# Patient Record
Sex: Female | Born: 1960 | Race: White | Hispanic: No | Marital: Married | Smoking: Former smoker
Health system: Southern US, Community
[De-identification: ages and names within clinical notes are randomized; demographics above are authoritative.]

## PROBLEM LIST (undated history)

## (undated) DIAGNOSIS — J45909 Unspecified asthma, uncomplicated: Secondary | ICD-10-CM

## (undated) DIAGNOSIS — F329 Major depressive disorder, single episode, unspecified: Secondary | ICD-10-CM

## (undated) DIAGNOSIS — R32 Unspecified urinary incontinence: Secondary | ICD-10-CM

## (undated) DIAGNOSIS — Z201 Contact with and (suspected) exposure to tuberculosis: Secondary | ICD-10-CM

## (undated) DIAGNOSIS — K5792 Diverticulitis of intestine, part unspecified, without perforation or abscess without bleeding: Secondary | ICD-10-CM

## (undated) DIAGNOSIS — K219 Gastro-esophageal reflux disease without esophagitis: Secondary | ICD-10-CM

## (undated) DIAGNOSIS — R519 Headache, unspecified: Secondary | ICD-10-CM

## (undated) DIAGNOSIS — I10 Essential (primary) hypertension: Secondary | ICD-10-CM

## (undated) DIAGNOSIS — Z808 Family history of malignant neoplasm of other organs or systems: Secondary | ICD-10-CM

## (undated) DIAGNOSIS — G4730 Sleep apnea, unspecified: Secondary | ICD-10-CM

## (undated) DIAGNOSIS — E039 Hypothyroidism, unspecified: Secondary | ICD-10-CM

## (undated) DIAGNOSIS — Z803 Family history of malignant neoplasm of breast: Secondary | ICD-10-CM

## (undated) DIAGNOSIS — R51 Headache: Secondary | ICD-10-CM

## (undated) DIAGNOSIS — M1990 Unspecified osteoarthritis, unspecified site: Secondary | ICD-10-CM

## (undated) DIAGNOSIS — F32A Depression, unspecified: Secondary | ICD-10-CM

## (undated) DIAGNOSIS — F419 Anxiety disorder, unspecified: Secondary | ICD-10-CM

## (undated) DIAGNOSIS — T7840XA Allergy, unspecified, initial encounter: Secondary | ICD-10-CM

## (undated) DIAGNOSIS — E785 Hyperlipidemia, unspecified: Secondary | ICD-10-CM

## (undated) DIAGNOSIS — R002 Palpitations: Secondary | ICD-10-CM

## (undated) DIAGNOSIS — H269 Unspecified cataract: Secondary | ICD-10-CM

## (undated) DIAGNOSIS — Z800 Family history of malignant neoplasm of digestive organs: Secondary | ICD-10-CM

## (undated) DIAGNOSIS — T4145XA Adverse effect of unspecified anesthetic, initial encounter: Secondary | ICD-10-CM

## (undated) DIAGNOSIS — A048 Other specified bacterial intestinal infections: Secondary | ICD-10-CM

## (undated) DIAGNOSIS — G4733 Obstructive sleep apnea (adult) (pediatric): Secondary | ICD-10-CM

## (undated) DIAGNOSIS — C541 Malignant neoplasm of endometrium: Secondary | ICD-10-CM

## (undated) DIAGNOSIS — Z8719 Personal history of other diseases of the digestive system: Secondary | ICD-10-CM

## (undated) DIAGNOSIS — K589 Irritable bowel syndrome without diarrhea: Secondary | ICD-10-CM

## (undated) HISTORY — DX: Sleep apnea, unspecified: G47.30

## (undated) HISTORY — DX: Irritable bowel syndrome, unspecified: K58.9

## (undated) HISTORY — DX: Palpitations: R00.2

## (undated) HISTORY — PX: OTHER SURGICAL HISTORY: SHX169

## (undated) HISTORY — DX: Hypothyroidism, unspecified: E03.9

## (undated) HISTORY — DX: Major depressive disorder, single episode, unspecified: F32.9

## (undated) HISTORY — DX: Family history of malignant neoplasm of breast: Z80.3

## (undated) HISTORY — DX: Obstructive sleep apnea (adult) (pediatric): G47.33

## (undated) HISTORY — DX: Family history of malignant neoplasm of other organs or systems: Z80.8

## (undated) HISTORY — DX: Diverticulitis of intestine, part unspecified, without perforation or abscess without bleeding: K57.92

## (undated) HISTORY — DX: Hyperlipidemia, unspecified: E78.5

## (undated) HISTORY — DX: Family history of malignant neoplasm of digestive organs: Z80.0

## (undated) HISTORY — DX: Unspecified osteoarthritis, unspecified site: M19.90

## (undated) HISTORY — DX: Malignant neoplasm of endometrium: C54.1

## (undated) HISTORY — DX: Unspecified asthma, uncomplicated: J45.909

## (undated) HISTORY — DX: Unspecified cataract: H26.9

## (undated) HISTORY — DX: Essential (primary) hypertension: I10

## (undated) HISTORY — DX: Depression, unspecified: F32.A

## (undated) HISTORY — DX: Allergy, unspecified, initial encounter: T78.40XA

## (undated) HISTORY — DX: Anxiety disorder, unspecified: F41.9

---

## 1989-01-07 HISTORY — PX: TUBAL LIGATION: SHX77

## 1998-01-07 HISTORY — PX: CHOLECYSTECTOMY: SHX55

## 2014-01-07 HISTORY — PX: COLECTOMY: SHX59

## 2014-03-03 DIAGNOSIS — M5136 Other intervertebral disc degeneration, lumbar region: Secondary | ICD-10-CM | POA: Insufficient documentation

## 2014-03-03 DIAGNOSIS — M5030 Other cervical disc degeneration, unspecified cervical region: Secondary | ICD-10-CM

## 2014-03-03 HISTORY — DX: Other cervical disc degeneration, unspecified cervical region: M50.30

## 2014-04-01 LAB — HM COLONOSCOPY

## 2014-09-08 DIAGNOSIS — I2510 Atherosclerotic heart disease of native coronary artery without angina pectoris: Secondary | ICD-10-CM | POA: Insufficient documentation

## 2014-09-08 HISTORY — DX: Atherosclerotic heart disease of native coronary artery without angina pectoris: I25.10

## 2015-12-04 ENCOUNTER — Telehealth: Payer: Self-pay | Admitting: *Deleted

## 2015-12-04 NOTE — Telephone Encounter (Signed)
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  She will be very immunosuppressed.

## 2015-12-04 NOTE — Telephone Encounter (Signed)
New patient asking whether she needs to be seen at this time.  She was diagnosed with uterine cancer last week and has been referred to a cancer MD.  She is concerned about having latent TB treatment and cancer treatment at the same time.  The patient's PCP, Dr. Allean Found, referred her to [facility].  RN advised the patient that this RN would pass her question along to Dr Johnnye Sima to answer her question.  RN also advised the patient to call her PCP and GYN to let them know her concern.  Patient stated that she would call her MDs.

## 2015-12-05 NOTE — Telephone Encounter (Signed)
RN spoke with the patient and shared Dr. Algis Downs message.  Patient given office address and she will use GPS to come the appointment tomorrow.

## 2015-12-06 ENCOUNTER — Encounter: Payer: Self-pay | Admitting: Infectious Diseases

## 2015-12-06 ENCOUNTER — Ambulatory Visit (INDEPENDENT_AMBULATORY_CARE_PROVIDER_SITE_OTHER): Payer: Medicaid Other | Admitting: Infectious Diseases

## 2015-12-06 VITALS — BP 111/80 | HR 58 | Temp 97.6°F | Ht 63.0 in | Wt 222.5 lb

## 2015-12-06 DIAGNOSIS — C541 Malignant neoplasm of endometrium: Secondary | ICD-10-CM

## 2015-12-06 DIAGNOSIS — Z227 Latent tuberculosis: Secondary | ICD-10-CM

## 2015-12-06 DIAGNOSIS — R7611 Nonspecific reaction to tuberculin skin test without active tuberculosis: Secondary | ICD-10-CM

## 2015-12-06 HISTORY — DX: Latent tuberculosis: Z22.7

## 2015-12-06 LAB — CBC
HCT: 41.9 % (ref 35.0–45.0)
HEMOGLOBIN: 13.8 g/dL (ref 11.7–15.5)
MCH: 28.9 pg (ref 27.0–33.0)
MCHC: 32.9 g/dL (ref 32.0–36.0)
MCV: 87.7 fL (ref 80.0–100.0)
MPV: 9.4 fL (ref 7.5–12.5)
PLATELETS: 316 10*3/uL (ref 140–400)
RBC: 4.78 MIL/uL (ref 3.80–5.10)
RDW: 13.5 % (ref 11.0–15.0)
WBC: 10.2 10*3/uL (ref 3.8–10.8)

## 2015-12-06 LAB — COMPREHENSIVE METABOLIC PANEL
ALBUMIN: 3.7 g/dL (ref 3.6–5.1)
ALK PHOS: 132 U/L — AB (ref 33–130)
ALT: 10 U/L (ref 6–29)
AST: 13 U/L (ref 10–35)
BILIRUBIN TOTAL: 0.4 mg/dL (ref 0.2–1.2)
BUN: 15 mg/dL (ref 7–25)
CALCIUM: 9.2 mg/dL (ref 8.6–10.4)
CO2: 26 mmol/L (ref 20–31)
Chloride: 105 mmol/L (ref 98–110)
Creat: 0.87 mg/dL (ref 0.50–1.05)
Glucose, Bld: 100 mg/dL — ABNORMAL HIGH (ref 65–99)
POTASSIUM: 4.1 mmol/L (ref 3.5–5.3)
Sodium: 139 mmol/L (ref 135–146)
TOTAL PROTEIN: 6.7 g/dL (ref 6.1–8.1)

## 2015-12-06 MED ORDER — RIFAMPIN 300 MG PO CAPS: 600.0000 mg | ORAL_CAPSULE | Freq: Every day | ORAL | 3 refills | Status: AC

## 2015-12-06 NOTE — Assessment & Plan Note (Signed)
I have asked her to let me know, let her oncologist know, about her possible chemo regiemen so that we can monitor for drug interactions. She is scheduled to have hysterectomy.

## 2015-12-06 NOTE — Assessment & Plan Note (Addendum)
Will check her quantiferon and her LFTs today.  Will start her on rifampin 600mg  daily for 4 months.  Will see her back in 4 months Will ask that she calls if any problems- rash, icterus, pruritis, adverse drug reaction.   Spoke with pharm, rif should decrease her statin levels. Will leave statin dose as is.  Explained pt red secretions.  Has had her flu shot.

## 2015-12-06 NOTE — Progress Notes (Signed)
   Subjective:    Patient ID: Allison Spencer, female    DOB: 08/30/1960, 55 y.o.   MRN: [id number]  HPI 55 yo F with hx of joint and muscle aches. She was eval by rhuem.  She was screened there for TB and was found to have a positive quantiferon.  She was seen by health dept in [place] and had negative CXR, negative sputum Cx (per pt).   Feels well. Some days she has muscle aches, headaches, stomach discomfort.   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 Her PPDs at this time were negative. No known TB exposure.  Never been in [employer], never lived overseas. No homelessness, no incarceration.   Review of Systems  Constitutional: Negative for appetite change, chills, fever and unexpected weight change.  Respiratory: Positive for cough and shortness of breath.   Gastrointestinal: Positive for constipation and diarrhea.  Genitourinary: Negative for difficulty urinating.  Hematological: Negative for adenopathy.  cough with mucous production at night.  No night sweats.     Objective:   Physical Exam  Constitutional: She appears well-developed and well-nourished.  HENT:  Mouth/Throat: No oropharyngeal exudate.  Eyes: EOM are normal. Pupils are equal, round, and reactive to light.  Neck: Neck supple.  Cardiovascular: Normal rate, regular rhythm and normal heart sounds.   Pulmonary/Chest: Effort normal and breath sounds normal.  Abdominal: Soft. Bowel sounds are normal.  Musculoskeletal: She exhibits no edema.  Lymphadenopathy:    She has no cervical adenopathy.    She has no axillary adenopathy.          Assessment & Plan:

## 2015-12-08 LAB — QUANTIFERON TB GOLD ASSAY (BLOOD)
Interferon Gamma Release Assay: POSITIVE — AB
QUANTIFERON NIL VALUE: 0.04 [IU]/mL
Quantiferon Tb Ag Minus Nil Value: 0.42 IU/mL

## 2015-12-13 ENCOUNTER — Encounter: Payer: Self-pay | Admitting: Gynecologic Oncology

## 2015-12-13 ENCOUNTER — Ambulatory Visit: Payer: Medicaid Other | Attending: Gynecologic Oncology | Admitting: Gynecologic Oncology

## 2015-12-13 VITALS — BP 147/91 | HR 63 | Temp 97.8°F | Resp 18 | Ht 63.0 in | Wt 221.2 lb

## 2015-12-13 DIAGNOSIS — K589 Irritable bowel syndrome without diarrhea: Secondary | ICD-10-CM | POA: Diagnosis not present

## 2015-12-13 DIAGNOSIS — Z87891 Personal history of nicotine dependence: Secondary | ICD-10-CM | POA: Diagnosis not present

## 2015-12-13 DIAGNOSIS — F419 Anxiety disorder, unspecified: Secondary | ICD-10-CM | POA: Insufficient documentation

## 2015-12-13 DIAGNOSIS — Z9889 Other specified postprocedural states: Secondary | ICD-10-CM | POA: Insufficient documentation

## 2015-12-13 DIAGNOSIS — G4733 Obstructive sleep apnea (adult) (pediatric): Secondary | ICD-10-CM | POA: Insufficient documentation

## 2015-12-13 DIAGNOSIS — Z79899 Other long term (current) drug therapy: Secondary | ICD-10-CM | POA: Insufficient documentation

## 2015-12-13 DIAGNOSIS — I10 Essential (primary) hypertension: Secondary | ICD-10-CM | POA: Diagnosis not present

## 2015-12-13 DIAGNOSIS — Z888 Allergy status to other drugs, medicaments and biological substances status: Secondary | ICD-10-CM | POA: Diagnosis not present

## 2015-12-13 DIAGNOSIS — Z01818 Encounter for other preprocedural examination: Secondary | ICD-10-CM | POA: Insufficient documentation

## 2015-12-13 DIAGNOSIS — G4730 Sleep apnea, unspecified: Secondary | ICD-10-CM | POA: Diagnosis not present

## 2015-12-13 DIAGNOSIS — Z825 Family history of asthma and other chronic lower respiratory diseases: Secondary | ICD-10-CM | POA: Diagnosis not present

## 2015-12-13 DIAGNOSIS — E785 Hyperlipidemia, unspecified: Secondary | ICD-10-CM | POA: Insufficient documentation

## 2015-12-13 DIAGNOSIS — C541 Malignant neoplasm of endometrium: Secondary | ICD-10-CM | POA: Insufficient documentation

## 2015-12-13 DIAGNOSIS — Z8249 Family history of ischemic heart disease and other diseases of the circulatory system: Secondary | ICD-10-CM | POA: Diagnosis not present

## 2015-12-13 DIAGNOSIS — Z9049 Acquired absence of other specified parts of digestive tract: Secondary | ICD-10-CM | POA: Diagnosis not present

## 2015-12-13 DIAGNOSIS — F329 Major depressive disorder, single episode, unspecified: Secondary | ICD-10-CM | POA: Diagnosis not present

## 2015-12-13 NOTE — Patient Instructions (Signed)
Preparing for your Surgery  Plan for surgery on January 18, 2016 with Dr. Everitt Amber at [place] will be scheduled for robotic assisted total hysterectomy, bilateral salpingo-oophorectomy, sentinel lymph node biopsy.  Pre-operative Testing -You will receive a phone call from presurgical testing at [facility] to arrange for a pre-operative testing appointment before your surgery.  This appointment normally occurs one to two weeks before your scheduled surgery.   -Bring your insurance card, copy of an advanced directive if applicable, medication list  -At that visit, you will be asked to sign a consent for a possible blood transfusion in case a transfusion becomes necessary during surgery.  The need for a blood transfusion is rare but having consent is a necessary part of your care.     -You should not be taking blood thinners or aspirin at least ten days prior to surgery unless instructed by your surgeon.  Day Before Surgery at [place] will be asked to take in a light diet the day before surgery.  Avoid carbonated beverages.  You will be advised to have nothing to eat or drink after midnight the evening before.     Eat a light diet the day before surgery.  Examples including soups, broths, toast, yogurt, mashed potatoes.  Things to avoid include carbonated beverages (fizzy beverages), raw fruits and raw vegetables, or beans.    If your bowels are filled with gas, your surgeon will have difficulty visualizing your pelvic organs which increases your surgical risks.  Your role in recovery Your role is to become active as soon as directed by your doctor, while still giving yourself time to heal.  Rest when you feel tired. You will be asked to do the following in order to speed your recovery:  - Cough and breathe deeply. This helps toclear and expand your lungs and can prevent pneumonia. You may be given a spirometer to practice deep breathing. A staff  member will show you how to use the spirometer. - Do mild physical activity. Walking or moving your legs help your circulation and body functions return to normal. A staff member will help you when you try to walk and will provide you with simple exercises. Do not try to get up or walk alone the first time. - Actively manage your pain. Managing your pain lets you move in comfort. We will ask you to rate your pain on a scale of zero to 10. It is your responsibility to tell your doctor or nurse where and how much you hurt so your pain can be treated.  Special Considerations -If you are diabetic, you may be placed on insulin after surgery to have closer control over your blood sugars to promote healing and recovery.  This does not mean that you will be discharged on insulin.  If applicable, your oral antidiabetics will be resumed when you are tolerating a solid diet.  -Your final pathology results from surgery should be available by the Friday after surgery and the results will be relayed to you when available.   Blood Transfusion Information WHAT IS A BLOOD TRANSFUSION? A transfusion is the replacement of blood or some of its parts. Blood is made up of multiple cells which provide different functions.  Red blood cells carry oxygen and are used for blood loss replacement.  White blood cells fight against infection.  Platelets control bleeding.  Plasma helps clot blood.  Other blood products are available for specialized needs, such as hemophilia or  other clotting disorders. BEFORE THE TRANSFUSION  Who gives blood for transfusions?   You may be able to donate blood to be used at a later date on yourself (autologous donation).  Relatives can be asked to donate blood. This is generally not any safer than if you have received blood from a stranger. The same precautions are taken to ensure safety when a relative's blood is donated.  Healthy volunteers who are fully evaluated to make sure their  blood is safe. This is blood bank blood. Transfusion therapy is the safest it has ever been in the practice of medicine. Before blood is taken from a donor, a complete history is taken to make sure that person has no history of diseases nor engages in risky social behavior (examples are intravenous drug use or sexual activity with multiple partners). The donor's travel history is screened to minimize risk of transmitting infections, such as malaria. The donated blood is tested for signs of infectious diseases, such as HIV and hepatitis. The blood is then tested to be sure it is compatible with you in order to minimize the chance of a transfusion reaction. If you or a relative donates blood, this is often done in anticipation of surgery and is not appropriate for emergency situations. It takes many days to process the donated blood. RISKS AND COMPLICATIONS Although transfusion therapy is very safe and saves many lives, the main dangers of transfusion include:   Getting an infectious disease.  Developing a transfusion reaction. This is an allergic reaction to something in the blood you were given. Every precaution is taken to prevent this. The decision to have a blood transfusion has been considered carefully by your caregiver before blood is given. Blood is not given unless the benefits outweigh the risks.

## 2015-12-13 NOTE — Progress Notes (Signed)
Consult Note: Gyn-Onc  Consult was requested by Dr. Alfonse Spruce for the evaluation of Allison Spencer 55 y.o. female  CC:  Chief Complaint  Patient presents with  . endometrial adenocarcinoma    Assessment/Plan:  Allison Spencer  is a 55 y.o.  year old with grade 1 endometrial cancer.  A detailed discussion was held with the patient and her family with regard to to her endometrial cancer diagnosis. We discussed the standard management options for uterine cancer which includes surgery followed possibly by adjuvant therapy depending on the results of surgery. The options for surgical management include a hysterectomy and removal of the tubes and ovaries possibly with removal of pelvic and para-aortic lymph nodes.If feasible, a minimally invasive approach including a robotic hysterectomy or laparoscopic hysterectomy have benefits including shorter hospital stay, recovery time and better wound healing than with open surgery. The patient has been counseled about these surgical options and the risks of surgery in general including infection, bleeding, damage to surrounding structures (including bowel, bladder, ureters, nerves or vessels), and the postoperative risks of PE/ DVT, and lymphedema. I extensively reviewed the additional risks of robotic hysterectomy including possible need for conversion to open laparotomy.  I discussed positioning during surgery of trendelenberg and risks of minor facial swelling and care we take in preoperative positioning.  After counseling and consideration of her options, she desires to proceed with robotic assisted total hysterectomy with bilateral sapingo-oophorectomy and SLN biopsy.   She will be seen by anesthesia for preoperative clearance and discussion of postoperative pain management.  She was given the opportunity to ask questions, which were answered to her satisfaction, and she is agreement with the above mentioned plan of care.  I discussed that she is at  increased risk for surgical complications due to her morbid obesity. I discussed that she may require mini-laparotomy for specimen delivery.  She will bring her CPAP machine for postop use.  She has issues with severe anxiety while coming out of anesthesia.   HPI: Allison Spencer is a 55 year old G3P3 who is seen in consultation at the request of Dr Alfonse Spruce for grade 1 endometrial cancer. She was for a routine pap smear by her PCP, Heide Scales, FNP on 07/05/15. This revealed atypical glandular cells - endometrial. She was then seen by Dr Alfonse Spruce on 11/24/15 and reported a single episode of light pink spotting but otherwise no bleeding. 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  The patient has had a recent history of a laparoscopic sigmoid colectomy for diverticulitis in 2016. She also has a history of a laparoscopic cholecystectomy and tubal ligation.  She has morbid obesity and severe sleep apnea.   Current Meds:  Outpatient Encounter Prescriptions as of 12/13/2015  Medication Sig  . B Complex Vitamins (VITAMIN-B COMPLEX) TABS Take by mouth.  . dicyclomine (BENTYL) 10 MG capsule Take 10 mg by mouth.  . escitalopram (LEXAPRO) 20 MG tablet TAKE ONE AND ONE HALF (1.5) TABLETS BY MOUTH EVERY MORNING  . ibuprofen (ADVIL,MOTRIN) 800 MG tablet TAKE 1 TABLET BY MOUTH 3 TIMES A DAY WITH FOOD OR MILK  . ketoconazole (NIZORAL) 2 % shampoo APPLY TO WASH TWICE WEEKLY  . losartan (COZAAR) 25 MG tablet Take 25 mg by mouth daily.  . metoprolol tartrate (LOPRESSOR) 25 MG tablet TAKE 1 TABLET BY MOUTH TWICE A DAY  . pantoprazole (PROTONIX) 40 MG tablet TAKE 1 TABLET BY MOUTH DAILY  .  pantoprazole (PROTONIX) 40 MG tablet Take 40 mg by mouth daily.  . pravastatin (PRAVACHOL) 20 MG tablet TAKE 1 TABLET BY MOUTH DAILY  . ranitidine (ZANTAC) 300 MG capsule Take 300 mg by mouth every evening.  . rifampin (RIFADIN) 300 MG  capsule Take 2 capsules (600 mg total) by mouth daily.  . traZODone (DESYREL) 100 MG tablet TAKE TWO AND ONE HALF (2.5) TABLETS BY MOUTH AT BEDTIME, AS NEEDED   No facility-administered encounter medications on file as of 12/13/2015.     Allergy:  Allergies  Allergen Reactions  . Codeine Nausea And Vomiting    UNKNOWN  . Sulfa Antibiotics     NAUSEA    Social Hx:   Social History   Social History  . Marital status: Single    Spouse name: N/A  . Number of children: N/A  . Years of education: N/A   Occupational History  . Not on file.   Social History Main Topics  . Smoking status: Former Smoker    Types: Cigarettes    Quit date: 12/05/1988  . Smokeless tobacco: Never Used  . Alcohol use Not on file  . Drug use: Unknown  . Sexual activity: Yes    Partners: Male   Other Topics Concern  . Not on file   Social History Narrative  . No narrative on file    Past Surgical Hx:  Past Surgical History:  Procedure Laterality Date  . CHOLECYSTECTOMY  2000  . COLECTOMY  2016  . TUBAL LIGATION  1991    Past Medical Hx:  Past Medical History:  Diagnosis Date  . Arthritis   . Depression   . Diverticulitis   . Endometrial cancer ([place])   . Essential hypertension   . Heart palpitations   . Hyperlipidemia   . Hyperlipidemia   . IBS (irritable bowel syndrome)   . OSA (obstructive sleep apnea)     Past Gynecological History:  SVD x 3 No LMP recorded. Patient is postmenopausal.  Family Hx:  Family History  Problem Relation Age of Onset  . COPD Mother   . CAD Father     Review of Systems:  Constitutional  Feels well,    ENT Normal appearing ears and nares bilaterally Skin/Breast  No rash, sores, jaundice, itching, dryness Cardiovascular  No chest pain, shortness of breath, or edema  Pulmonary  No cough or wheeze.  Gastro Intestinal  No nausea, vomitting, or diarrhoea. No bright red blood per rectum, no abdominal pain, change in bowel movement, or  constipation.  Genito Urinary  No frequency, urgency, dysuria, + vaginal spotting. Musculo Skeletal  No myalgia, arthralgia, joint swelling or pain  Neurologic  No weakness, numbness, change in gait,  Psychology  No depression, anxiety, insomnia.   Vitals:  Blood pressure (!) 147/91, pulse 63, temperature 97.8 F (36.6 C), temperature source Oral, resp. rate 18, height 5\' 3"  (1.6 m), weight 221 lb 3.2 oz (100.3 kg), SpO2 97 %.  Physical Exam: WD in NAD Neck  Supple NROM, without any enlargements.  Lymph Node Survey No cervical supraclavicular or inguinal adenopathy Cardiovascular  Pulse normal rate, regularity and rhythm. S1 and S2 normal.  Lungs  Clear to auscultation bilateraly, without wheezes/crackles/rhonchi. Good air movement.  Skin  No rash/lesions/breakdown  Psychiatry  Alert and oriented to person, place, and time  Abdomen  Normoactive bowel sounds, abdomen soft, non-tender and obese without evidence of hernia.  Back No CVA tenderness Genito Urinary  Vulva/vagina: Normal external female genitalia.  No  lesions. No discharge or bleeding.  Bladder/urethra:  No lesions or masses, well supported bladder  Vagina: normal  Cervix: Normal appearing, no lesions.  Uterus:  Slightly bulky, mobile, no parametrial involvement or nodularity.  Adnexa: no palpable masses. Rectal  deferred.  Extremities  No bilateral cyanosis, clubbing or edema.   Donaciano Eva, MD  12/13/2015, 10:33 AM

## 2016-01-10 DIAGNOSIS — T8859XA Other complications of anesthesia, initial encounter: Secondary | ICD-10-CM

## 2016-01-10 HISTORY — PX: UPPER GI ENDOSCOPY: SHX6162

## 2016-01-10 HISTORY — DX: Other complications of anesthesia, initial encounter: T88.59XA

## 2016-01-11 NOTE — Patient Instructions (Addendum)
Allison Spencer  01/11/2016   Your procedure is scheduled on: 01-18-16  Report to [facility] Main  Entrance take [facility]  elevators to 3rd floor to  [place] at 530  AM.  Call this number if you have problems the morning of surgery [phone]  BRING CPAP MASK AND TUBING   Remember: ONLY 1 PERSON MAY GO WITH YOU TO SHORT STAY TO GET  READY MORNING OF YOUR SURGERY.  Do not eat food or drink liquids :After Midnight. LIGHT DIET DAY BEFORE SURGERY, SEE INSTRUCTIONS BELOW.NOTHING BY MOUTH AFTER MIDNIGHT NIGHT BEFORE SURGERY  Take these medicines the morning of surgery with A SIP OF WATER: ESCITALOPRAM (LEXAPRO), METORPOLOL TARTRATE, PANTAPRAZOLE (PROTONIX )  PRAVASTATIN (PRAVACHOL), RANITIDNE (ZANTAC), RIFAMPIN (RIFADIN)                               You may not have any metal on your body including hair pins and              piercings  Do not wear jewelry, make-up, lotions, powders or perfumes, deodorant             Do not wear nail polish.  Do not shave  48 hours prior to surgery.              Men may shave face and neck.   Do not bring valuables to the hospital. [place].  Contacts, dentures or bridgework may not be worn into surgery.  Leave suitcase in the car. After surgery it may be brought to your room.     _____________________________________________________________________             Eat a light diet the day before surgery.  Examples including soups, broths, toast, yogurt, mashed potatoes.  Things to avoid include carbonated beverages (fizzy beverages), raw fruits and raw vegetables, or beans.   If your bowels are filled with gas, your surgeon will have difficulty visualizing your pelvic organs which increases your surgical risks  .[place] - Preparing for Surgery Before surgery, you can play an important role.  Because skin is not sterile, your skin needs to be as free of germs as possible.  You  can reduce the number of germs on your skin by washing with CHG (chlorahexidine gluconate) soap before surgery.  CHG is an antiseptic cleaner which kills germs and bonds with the skin to continue killing germs even after washing. Please DO NOT use if you have an allergy to CHG or antibacterial soaps.  If your skin becomes reddened/irritated stop using the CHG and inform your nurse when you arrive at Short Stay. Do not shave (including legs and underarms) for at least 48 hours prior to the first CHG shower.  You may shave your face/neck. Please follow these instructions carefully:  1.  Shower with CHG Soap the night before surgery and the  morning of Surgery.  2.  If you choose to wash your hair, wash your hair first as usual with your  normal  shampoo.  3.  After you shampoo, rinse your hair and body thoroughly to remove the  shampoo.  4.  Use CHG as you would any other liquid soap.  You can apply chg directly  to the skin and wash                       Gently with a scrungie or clean washcloth.  5.  Apply the CHG Soap to your body ONLY FROM THE NECK DOWN.   Do not use on face/ open                           Wound or open sores. Avoid contact with eyes, ears mouth and genitals (private parts).                       Wash face,  Genitals (private parts) with your normal soap.             6.  Wash thoroughly, paying special attention to the area where your surgery  will be performed.  7.  Thoroughly rinse your body with warm water from the neck down.  8.  DO NOT shower/wash with your normal soap after using and rinsing off  the CHG Soap.                9.  Pat yourself dry with a clean towel.            10.  Wear clean pajamas.            11.  Place clean sheets on your bed the night of your first shower and do not  sleep with pets. Day of Surgery : Do not apply any lotions/deodorants the morning of surgery.  Please wear clean clothes to the hospital/surgery center.  FAILURE  TO FOLLOW THESE INSTRUCTIONS MAY RESULT IN THE CANCELLATION OF YOUR SURGERY PATIENT SIGNATURE_________________________________  NURSE SIGNATURE__________________________________  ________________________________________________________________________   Adam Phenix  An incentive spirometer is a tool that can help keep your lungs clear and active. This tool measures how well you are filling your lungs with each breath. Taking long deep breaths may help reverse or decrease the chance of developing breathing (pulmonary) problems (especially infection) following:  A long period of time when you are unable to move or be active. BEFORE THE PROCEDURE   If the spirometer includes an indicator to show your best effort, your nurse or respiratory therapist will set it to a desired goal.  If possible, sit up straight or lean slightly forward. Try not to slouch.  Hold the incentive spirometer in an upright position. INSTRUCTIONS FOR USE  1. Sit on the edge of your bed if possible, or sit up as far as you can in bed or on a chair. 2. Hold the incentive spirometer in an upright position. 3. Breathe out normally. 4. Place the mouthpiece in your mouth and seal your lips tightly around it. 5. Breathe in slowly and as deeply as possible, raising the piston or the ball toward the top of the column. 6. Hold your breath for 3-5 seconds or for as long as possible. Allow the piston or ball to fall to the bottom of the column. 7. Remove the mouthpiece from your mouth and breathe out normally. 8. Rest for a few seconds and repeat Steps 1 through 7 at least 10 times every 1-2 hours when you are awake. Take your time and take a few normal breaths between deep breaths. 9. The spirometer may include an indicator to show  your best effort. Use the indicator as a goal to work toward during each repetition. 10. After each set of 10 deep breaths, practice coughing to be sure your lungs are clear. If you have an  incision (the cut made at the time of surgery), support your incision when coughing by placing a pillow or rolled up towels firmly against it. Once you are able to get out of bed, walk around indoors and cough well. You may stop using the incentive spirometer when instructed by your caregiver.  RISKS AND COMPLICATIONS  Take your time so you do not get dizzy or light-headed.  If you are in pain, you may need to take or ask for pain medication before doing incentive spirometry. It is harder to take a deep breath if you are having pain. AFTER USE  Rest and breathe slowly and easily.  It can be helpful to keep track of a log of your progress. Your caregiver can provide you with a simple table to help with this. If you are using the spirometer at home, follow these instructions: [place] IF:   You are having difficultly using the spirometer.  You have trouble using the spirometer as often as instructed.  Your pain medication is not giving enough relief while using the spirometer.  You develop fever of 100.5 F (38.1 C) or higher. SEEK IMMEDIATE MEDICAL CARE IF:   You cough up bloody sputum that had not been present before.  You develop fever of 102 F (38.9 C) or greater.  You develop worsening pain at or near the incision site. MAKE SURE YOU:   Understand these instructions.  Will watch your condition.  Will get help right away if you are not doing well or get worse. Document Released: 05/06/2006 Document Revised: 03/18/2011 Document Reviewed: 07/07/2006 ExitCare Patient Information 2014 Exit[employer], [place].   ________________________________________________________________________  WHAT IS A BLOOD TRANSFUSION? Blood Transfusion Information  A transfusion is the replacement of blood or some of its parts. Blood is made up of multiple cells which provide different functions.  Red blood cells carry oxygen and are used for blood loss replacement.  White blood cells  fight against infection.  Platelets control bleeding.  Plasma helps clot blood.  Other blood products are available for specialized needs, such as hemophilia or other clotting disorders. BEFORE THE TRANSFUSION  Who gives blood for transfusions?   Healthy volunteers who are fully evaluated to make sure their blood is safe. This is blood bank blood. Transfusion therapy is the safest it has ever been in the practice of medicine. Before blood is taken from a donor, a complete history is taken to make sure that person has no history of diseases nor engages in risky social behavior (examples are intravenous drug use or sexual activity with multiple partners). The donor's travel history is screened to minimize risk of transmitting infections, such as malaria. The donated blood is tested for signs of infectious diseases, such as HIV and hepatitis. The blood is then tested to be sure it is compatible with you in order to minimize the chance of a transfusion reaction. If you or a relative donates blood, this is often done in anticipation of surgery and is not appropriate for emergency situations. It takes many days to process the donated blood. RISKS AND COMPLICATIONS Although transfusion therapy is very safe and saves many lives, the main dangers of transfusion include:   Getting an infectious disease.  Developing a transfusion reaction. This is an allergic reaction to  something in the blood you were given. Every precaution is taken to prevent this. The decision to have a blood transfusion has been considered carefully by your caregiver before blood is given. Blood is not given unless the benefits outweigh the risks. AFTER THE TRANSFUSION  Right after receiving a blood transfusion, you will usually feel much better and more energetic. This is especially true if your red blood cells have gotten low (anemic). The transfusion raises the level of the red blood cells which carry oxygen, and this usually  causes an energy increase.  The nurse administering the transfusion will monitor you carefully for complications. HOME CARE INSTRUCTIONS  No special instructions are needed after a transfusion. You may find your energy is better. Speak with your caregiver about any limitations on activity for underlying diseases you may have. SEEK MEDICAL CARE IF:   Your condition is not improving after your transfusion.  You develop redness or irritation at the intravenous (IV) site. SEEK IMMEDIATE MEDICAL CARE IF:  Any of the following symptoms occur over the next 12 hours:  Shaking chills.  You have a temperature by mouth above 102 F (38.9 C), not controlled by medicine.  Chest, back, or muscle pain.  People around you feel you are not acting correctly or are confused.  Shortness of breath or difficulty breathing.  Dizziness and fainting.  You get a rash or develop hives.  You have a decrease in urine output.  Your urine turns a dark color or changes to pink, red, or brown. Any of the following symptoms occur over the next 10 days:  You have a temperature by mouth above 102 F (38.9 C), not controlled by medicine.  Shortness of breath.  Weakness after normal activity.  The white part of the eye turns yellow (jaundice).  You have a decrease in the amount of urine or are urinating less often.  Your urine turns a dark color or changes to pink, red, or brown. Document Released: 12/22/1999 Document Revised: 03/18/2011 Document Reviewed: 08/10/2007 [facility] Patient Information 2014 [place], [place].  _______________________________________________________________________

## 2016-01-12 NOTE — Progress Notes (Signed)
Spoke with Berniece Andreas RN, ok to use July 2017 CXR, does not need repeating for surgery on 01/18/16 EKG 07/04/2015 per [facility]

## 2016-01-15 ENCOUNTER — Encounter (HOSPITAL_COMMUNITY): Payer: Self-pay

## 2016-01-15 ENCOUNTER — Encounter (HOSPITAL_COMMUNITY)
Admission: RE | Admit: 2016-01-15 | Discharge: 2016-01-15 | Disposition: A | Discharge status: Home / Self Care | Payer: Medicaid Other | Source: Ambulatory Visit | Attending: Gynecologic Oncology | Admitting: Gynecologic Oncology

## 2016-01-15 DIAGNOSIS — C541 Malignant neoplasm of endometrium: Secondary | ICD-10-CM | POA: Insufficient documentation

## 2016-01-15 DIAGNOSIS — Z01812 Encounter for preprocedural laboratory examination: Secondary | ICD-10-CM | POA: Diagnosis present

## 2016-01-15 HISTORY — DX: Adverse effect of unspecified anesthetic, initial encounter: T41.45XA

## 2016-01-15 HISTORY — DX: Headache: R51

## 2016-01-15 HISTORY — DX: Personal history of other diseases of the digestive system: Z87.19

## 2016-01-15 HISTORY — DX: Other specified bacterial intestinal infections: A04.8

## 2016-01-15 HISTORY — DX: Gastro-esophageal reflux disease without esophagitis: K21.9

## 2016-01-15 HISTORY — DX: Headache, unspecified: R51.9

## 2016-01-15 HISTORY — DX: Unspecified urinary incontinence: R32

## 2016-01-15 HISTORY — DX: Contact with and (suspected) exposure to tuberculosis: Z20.1

## 2016-01-15 LAB — URINALYSIS, ROUTINE W REFLEX MICROSCOPIC
Bacteria, UA: NONE SEEN
Bilirubin Urine: NEGATIVE
GLUCOSE, UA: NEGATIVE mg/dL
Ketones, ur: NEGATIVE mg/dL
Leukocytes, UA: NEGATIVE
NITRITE: NEGATIVE
PH: 5 (ref 5.0–8.0)
PROTEIN: NEGATIVE mg/dL
Specific Gravity, Urine: 1.013 (ref 1.005–1.030)

## 2016-01-15 LAB — CBC WITH DIFFERENTIAL/PLATELET
BASOS ABS: 0.1 10*3/uL (ref 0.0–0.1)
BASOS PCT: 1 %
EOS ABS: 0.4 10*3/uL (ref 0.0–0.7)
EOS PCT: 3 %
HCT: 40.4 % (ref 36.0–46.0)
Hemoglobin: 13.4 g/dL (ref 12.0–15.0)
Lymphocytes Relative: 28 %
Lymphs Abs: 3.4 10*3/uL (ref 0.7–4.0)
MCH: 29.5 pg (ref 26.0–34.0)
MCHC: 33.2 g/dL (ref 30.0–36.0)
MCV: 88.8 fL (ref 78.0–100.0)
Monocytes Absolute: 0.9 10*3/uL (ref 0.1–1.0)
Monocytes Relative: 8 %
Neutro Abs: 7.5 10*3/uL (ref 1.7–7.7)
Neutrophils Relative %: 60 %
PLATELETS: 299 10*3/uL (ref 150–400)
RBC: 4.55 MIL/uL (ref 3.87–5.11)
RDW: 13.6 % (ref 11.5–15.5)
WBC: 12.3 10*3/uL — AB (ref 4.0–10.5)

## 2016-01-15 LAB — COMPREHENSIVE METABOLIC PANEL
ALT: 16 U/L (ref 14–54)
AST: 16 U/L (ref 15–41)
Albumin: 3.6 g/dL (ref 3.5–5.0)
Alkaline Phosphatase: 122 U/L (ref 38–126)
Anion gap: 6 (ref 5–15)
BUN: 11 mg/dL (ref 6–20)
CHLORIDE: 106 mmol/L (ref 101–111)
CO2: 27 mmol/L (ref 22–32)
CREATININE: 0.81 mg/dL (ref 0.44–1.00)
Calcium: 8.9 mg/dL (ref 8.9–10.3)
GFR calc non Af Amer: >60 mL/min (ref >60)
Glucose, Bld: 100 mg/dL — ABNORMAL HIGH (ref 65–99)
POTASSIUM: 3.9 mmol/L (ref 3.5–5.1)
SODIUM: 139 mmol/L (ref 135–145)
Total Bilirubin: 0.3 mg/dL (ref 0.3–1.2)
Total Protein: 6.8 g/dL (ref 6.5–8.1)

## 2016-01-15 LAB — ABO/RH: ABO/RH(D): O POS

## 2016-01-15 NOTE — Progress Notes (Signed)
LOV DR Geraldo Pitter 11/02/2015 in chart CXR 01/05/2016 in chart

## 2016-01-16 ENCOUNTER — Encounter (HOSPITAL_COMMUNITY): Payer: Self-pay

## 2016-01-18 ENCOUNTER — Ambulatory Visit (HOSPITAL_COMMUNITY): Payer: Medicaid Other | Admitting: Anesthesiology

## 2016-01-18 ENCOUNTER — Encounter (HOSPITAL_COMMUNITY): Payer: Self-pay | Admitting: *Deleted

## 2016-01-18 ENCOUNTER — Ambulatory Visit (HOSPITAL_COMMUNITY)
Admission: RE | Admit: 2016-01-18 | Discharge: 2016-01-19 | Disposition: A | Discharge status: Home / Self Care | Payer: Medicaid Other | Source: Ambulatory Visit | Attending: Gynecologic Oncology | Admitting: Gynecologic Oncology

## 2016-01-18 ENCOUNTER — Encounter (HOSPITAL_COMMUNITY)
Admission: RE | Disposition: A | Discharge status: Home / Self Care | Payer: Self-pay | Source: Ambulatory Visit | Attending: Gynecologic Oncology

## 2016-01-18 DIAGNOSIS — I10 Essential (primary) hypertension: Secondary | ICD-10-CM | POA: Diagnosis not present

## 2016-01-18 DIAGNOSIS — Z882 Allergy status to sulfonamides status: Secondary | ICD-10-CM | POA: Diagnosis not present

## 2016-01-18 DIAGNOSIS — F329 Major depressive disorder, single episode, unspecified: Secondary | ICD-10-CM | POA: Insufficient documentation

## 2016-01-18 DIAGNOSIS — Z6838 Body mass index (BMI) 38.0-38.9, adult: Secondary | ICD-10-CM | POA: Insufficient documentation

## 2016-01-18 DIAGNOSIS — Z87891 Personal history of nicotine dependence: Secondary | ICD-10-CM | POA: Diagnosis not present

## 2016-01-18 DIAGNOSIS — Z885 Allergy status to narcotic agent status: Secondary | ICD-10-CM | POA: Diagnosis not present

## 2016-01-18 DIAGNOSIS — C775 Secondary and unspecified malignant neoplasm of intrapelvic lymph nodes: Secondary | ICD-10-CM | POA: Diagnosis not present

## 2016-01-18 DIAGNOSIS — M1990 Unspecified osteoarthritis, unspecified site: Secondary | ICD-10-CM | POA: Insufficient documentation

## 2016-01-18 DIAGNOSIS — G4733 Obstructive sleep apnea (adult) (pediatric): Secondary | ICD-10-CM | POA: Diagnosis not present

## 2016-01-18 DIAGNOSIS — E785 Hyperlipidemia, unspecified: Secondary | ICD-10-CM | POA: Insufficient documentation

## 2016-01-18 DIAGNOSIS — N83292 Other ovarian cyst, left side: Secondary | ICD-10-CM | POA: Diagnosis not present

## 2016-01-18 DIAGNOSIS — N83291 Other ovarian cyst, right side: Secondary | ICD-10-CM | POA: Insufficient documentation

## 2016-01-18 DIAGNOSIS — C541 Malignant neoplasm of endometrium: Secondary | ICD-10-CM | POA: Diagnosis present

## 2016-01-18 DIAGNOSIS — K589 Irritable bowel syndrome without diarrhea: Secondary | ICD-10-CM | POA: Diagnosis not present

## 2016-01-18 DIAGNOSIS — F419 Anxiety disorder, unspecified: Secondary | ICD-10-CM | POA: Diagnosis not present

## 2016-01-18 HISTORY — PX: ROBOTIC ASSISTED TOTAL HYSTERECTOMY WITH BILATERAL SALPINGO OOPHERECTOMY: SHX6086

## 2016-01-18 LAB — TYPE AND SCREEN
ABO/RH(D): O POS
ANTIBODY SCREEN: NEGATIVE

## 2016-01-18 SURGERY — HYSTERECTOMY, TOTAL, ROBOT-ASSISTED, LAPAROSCOPIC, WITH BILATERAL SALPINGO-OOPHORECTOMY
Anesthesia: General | Laterality: Bilateral

## 2016-01-18 MED ORDER — KCL IN DEXTROSE-NACL 20-5-0.45 MEQ/L-%-% IV SOLN
INTRAVENOUS | Status: DC
  Administered 2016-01-18 (×2): via INTRAVENOUS
  Filled 2016-01-18 (×2): qty 1000

## 2016-01-18 MED ORDER — STERILE WATER FOR INJECTION IJ SOLN
INTRAMUSCULAR | Status: AC
  Filled 2016-01-18: qty 10

## 2016-01-18 MED ORDER — CEFAZOLIN SODIUM-DEXTROSE 2-4 GM/100ML-% IV SOLN
INTRAVENOUS | Status: AC
  Filled 2016-01-18: qty 100

## 2016-01-18 MED ORDER — GABAPENTIN 300 MG PO CAPS
600.0000 mg | ORAL_CAPSULE | Freq: Every day | ORAL | Status: AC
  Administered 2016-01-18: 600 mg via ORAL
  Filled 2016-01-18: qty 2

## 2016-01-18 MED ORDER — ONDANSETRON HCL 4 MG/2ML IJ SOLN
4.0000 mg | Freq: Four times a day (QID) | INTRAMUSCULAR | Status: DC | PRN
  Administered 2016-01-18 – 2016-01-19 (×2): 4 mg via INTRAVENOUS
  Filled 2016-01-18 (×2): qty 2

## 2016-01-18 MED ORDER — HYDROMORPHONE HCL 1 MG/ML IJ SOLN
0.2500 mg | INTRAMUSCULAR | Status: DC | PRN
  Administered 2016-01-18: 0.5 mg via INTRAVENOUS

## 2016-01-18 MED ORDER — ESCITALOPRAM OXALATE 20 MG PO TABS
20.0000 mg | ORAL_TABLET | Freq: Every day | ORAL | Status: DC
  Administered 2016-01-19: 20 mg via ORAL
  Filled 2016-01-18: qty 1

## 2016-01-18 MED ORDER — CEFAZOLIN SODIUM-DEXTROSE 2-4 GM/100ML-% IV SOLN
2.0000 g | INTRAVENOUS | Status: AC
  Administered 2016-01-18: 2 g via INTRAVENOUS

## 2016-01-18 MED ORDER — RIFAMPIN 300 MG PO CAPS
600.0000 mg | ORAL_CAPSULE | Freq: Every day | ORAL | Status: DC
  Administered 2016-01-19: 600 mg via ORAL
  Filled 2016-01-18: qty 2

## 2016-01-18 MED ORDER — FENTANYL CITRATE (PF) 250 MCG/5ML IJ SOLN
INTRAMUSCULAR | Status: AC
  Filled 2016-01-18: qty 5

## 2016-01-18 MED ORDER — PHENYLEPHRINE 40 MCG/ML (10ML) SYRINGE FOR IV PUSH (FOR BLOOD PRESSURE SUPPORT)
PREFILLED_SYRINGE | INTRAVENOUS | Status: DC | PRN
  Administered 2016-01-18: 80 ug via INTRAVENOUS

## 2016-01-18 MED ORDER — PROPOFOL 10 MG/ML IV BOLUS
INTRAVENOUS | Status: DC | PRN
  Administered 2016-01-18: 200 mg via INTRAVENOUS

## 2016-01-18 MED ORDER — MIDAZOLAM HCL 2 MG/2ML IJ SOLN
INTRAMUSCULAR | Status: AC
  Filled 2016-01-18: qty 2

## 2016-01-18 MED ORDER — ONDANSETRON HCL 4 MG/2ML IJ SOLN
INTRAMUSCULAR | Status: DC | PRN
  Administered 2016-01-18: 4 mg via INTRAVENOUS

## 2016-01-18 MED ORDER — FENTANYL CITRATE (PF) 100 MCG/2ML IJ SOLN
INTRAMUSCULAR | Status: AC
  Filled 2016-01-18: qty 2

## 2016-01-18 MED ORDER — ROCURONIUM BROMIDE 10 MG/ML (PF) SYRINGE
PREFILLED_SYRINGE | INTRAVENOUS | Status: DC | PRN
  Administered 2016-01-18: 10 mg via INTRAVENOUS
  Administered 2016-01-18: 20 mg via INTRAVENOUS
  Administered 2016-01-18: 50 mg via INTRAVENOUS

## 2016-01-18 MED ORDER — HYDROMORPHONE HCL 2 MG PO TABS: 2.0000 mg | ORAL_TABLET | ORAL | Status: DC | PRN

## 2016-01-18 MED ORDER — ONDANSETRON HCL 4 MG PO TABS: 4.0000 mg | ORAL_TABLET | Freq: Four times a day (QID) | ORAL | Status: DC | PRN

## 2016-01-18 MED ORDER — HYDROMORPHONE HCL 2 MG/ML IJ SOLN
0.2000 mg | INTRAMUSCULAR | Status: DC | PRN
  Administered 2016-01-18: 0.6 mg via INTRAVENOUS
  Filled 2016-01-18: qty 1

## 2016-01-18 MED ORDER — TRAMADOL HCL 50 MG PO TABS
100.0000 mg | ORAL_TABLET | Freq: Two times a day (BID) | ORAL | Status: DC | PRN
  Administered 2016-01-18: 100 mg via ORAL
  Filled 2016-01-18 (×2): qty 2

## 2016-01-18 MED ORDER — PANTOPRAZOLE SODIUM 40 MG PO TBEC
80.0000 mg | DELAYED_RELEASE_TABLET | Freq: Every day | ORAL | Status: DC
  Administered 2016-01-19: 80 mg via ORAL
  Filled 2016-01-18: qty 2

## 2016-01-18 MED ORDER — LACTATED RINGERS IV SOLN
INTRAVENOUS | Status: DC | PRN
  Administered 2016-01-18: 1000 mL

## 2016-01-18 MED ORDER — ACETAMINOPHEN 500 MG PO TABS
1000.0000 mg | ORAL_TABLET | Freq: Two times a day (BID) | ORAL | Status: DC
  Administered 2016-01-18 – 2016-01-19 (×2): 1000 mg via ORAL
  Filled 2016-01-18 (×2): qty 2

## 2016-01-18 MED ORDER — LACTATED RINGERS IV SOLN
INTRAVENOUS | Status: DC | PRN
  Administered 2016-01-18 (×2): via INTRAVENOUS

## 2016-01-18 MED ORDER — IBUPROFEN 800 MG PO TABS
800.0000 mg | ORAL_TABLET | Freq: Three times a day (TID) | ORAL | Status: DC | PRN
  Administered 2016-01-19: 800 mg via ORAL
  Filled 2016-01-18: qty 1

## 2016-01-18 MED ORDER — ENOXAPARIN SODIUM 40 MG/0.4ML ~~LOC~~ SOLN
40.0000 mg | SUBCUTANEOUS | Status: AC
  Administered 2016-01-18: 40 mg via SUBCUTANEOUS
  Filled 2016-01-18: qty 0.4

## 2016-01-18 MED ORDER — ONDANSETRON HCL 4 MG/2ML IJ SOLN: 4.0000 mg | Freq: Once | INTRAMUSCULAR | Status: DC | PRN

## 2016-01-18 MED ORDER — MIDAZOLAM HCL 5 MG/5ML IJ SOLN
INTRAMUSCULAR | Status: DC | PRN
  Administered 2016-01-18: 2 mg via INTRAVENOUS

## 2016-01-18 MED ORDER — PRAVASTATIN SODIUM 20 MG PO TABS
20.0000 mg | ORAL_TABLET | Freq: Every day | ORAL | Status: DC
  Administered 2016-01-19: 20 mg via ORAL
  Filled 2016-01-18: qty 1

## 2016-01-18 MED ORDER — SUGAMMADEX SODIUM 200 MG/2ML IV SOLN
INTRAVENOUS | Status: DC | PRN
  Administered 2016-01-18: 200 mg via INTRAVENOUS

## 2016-01-18 MED ORDER — HYDROMORPHONE HCL 2 MG/ML IJ SOLN
INTRAMUSCULAR | Status: AC
  Administered 2016-01-18: 1 mg
  Filled 2016-01-18: qty 1

## 2016-01-18 MED ORDER — GLYCOPYRROLATE 0.2 MG/ML IV SOSY
PREFILLED_SYRINGE | INTRAVENOUS | Status: DC | PRN
  Administered 2016-01-18: .3 mg via INTRAVENOUS

## 2016-01-18 MED ORDER — STERILE WATER FOR INJECTION IJ SOLN
INTRAMUSCULAR | Status: DC | PRN
  Administered 2016-01-18: 4 mL

## 2016-01-18 MED ORDER — PROPOFOL 10 MG/ML IV BOLUS
INTRAVENOUS | Status: AC
  Filled 2016-01-18: qty 40

## 2016-01-18 MED ORDER — ENOXAPARIN SODIUM 40 MG/0.4ML ~~LOC~~ SOLN
40.0000 mg | SUBCUTANEOUS | Status: DC
  Administered 2016-01-19: 40 mg via SUBCUTANEOUS
  Filled 2016-01-18: qty 0.4

## 2016-01-18 MED ORDER — FENTANYL CITRATE (PF) 100 MCG/2ML IJ SOLN
INTRAMUSCULAR | Status: DC | PRN
  Administered 2016-01-18: 50 ug via INTRAVENOUS
  Administered 2016-01-18: 100 ug via INTRAVENOUS
  Administered 2016-01-18 (×2): 50 ug via INTRAVENOUS

## 2016-01-18 MED ORDER — STERILE WATER FOR IRRIGATION IR SOLN
Status: DC | PRN
  Administered 2016-01-18: 1000 mL

## 2016-01-18 MED ORDER — EPHEDRINE SULFATE-NACL 50-0.9 MG/10ML-% IV SOSY
PREFILLED_SYRINGE | INTRAVENOUS | Status: DC | PRN
  Administered 2016-01-18: 10 mg via INTRAVENOUS

## 2016-01-18 MED ORDER — ONDANSETRON HCL 4 MG/2ML IJ SOLN
INTRAMUSCULAR | Status: AC
  Filled 2016-01-18: qty 2

## 2016-01-18 MED ORDER — HYDROMORPHONE HCL 1 MG/ML IJ SOLN
INTRAMUSCULAR | Status: AC
  Filled 2016-01-18: qty 1

## 2016-01-18 MED ORDER — LOSARTAN POTASSIUM 25 MG PO TABS
25.0000 mg | ORAL_TABLET | Freq: Every day | ORAL | Status: DC
  Administered 2016-01-19: 25 mg via ORAL
  Filled 2016-01-18: qty 1

## 2016-01-18 MED ORDER — FENTANYL CITRATE (PF) 100 MCG/2ML IJ SOLN
25.0000 ug | INTRAMUSCULAR | Status: DC | PRN
  Administered 2016-01-18 (×2): 50 ug via INTRAVENOUS

## 2016-01-18 MED ORDER — LIDOCAINE 2% (20 MG/ML) 5 ML SYRINGE
INTRAMUSCULAR | Status: DC | PRN
  Administered 2016-01-18: 60 mg via INTRAVENOUS

## 2016-01-18 MED ORDER — METOPROLOL TARTRATE 25 MG PO TABS
25.0000 mg | ORAL_TABLET | Freq: Two times a day (BID) | ORAL | Status: DC
Start: 2016-01-19 — End: 2016-01-19
  Administered 2016-01-19: 25 mg via ORAL
  Filled 2016-01-18: qty 1

## 2016-01-18 SURGICAL SUPPLY — 69 items
ADH SKN CLS APL DERMABOND .7 (GAUZE/BANDAGES/DRESSINGS) ×1
AGENT HMST KT MTR STRL THRMB (HEMOSTASIS)
APL ESCP 34 STRL LF DISP (HEMOSTASIS)
APPLICATOR SURGIFLO ENDO (HEMOSTASIS) IMPLANT
BAG LAPAROSCOPIC 12 15 PORT 16 (BASKET) IMPLANT
BAG RETRIEVAL 12/15 (BASKET)
BAG SPEC RTRVL LRG 6X4 10 (ENDOMECHANICALS)
CHLORAPREP W/TINT 26ML (MISCELLANEOUS) ×2 IMPLANT
COVER SURGICAL LIGHT HANDLE (MISCELLANEOUS) ×2 IMPLANT
COVER TIP SHEARS 8 DVNC (MISCELLANEOUS) ×1 IMPLANT
COVER TIP SHEARS 8MM DA VINCI (MISCELLANEOUS) ×1
DERMABOND ADVANCED (GAUZE/BANDAGES/DRESSINGS) ×1
DERMABOND ADVANCED .7 DNX12 (GAUZE/BANDAGES/DRESSINGS) ×1 IMPLANT
DRAPE ARM DVNC X/XI (DISPOSABLE) ×4 IMPLANT
DRAPE COLUMN DVNC XI (DISPOSABLE) ×1 IMPLANT
DRAPE DA VINCI XI ARM (DISPOSABLE) ×4
DRAPE DA VINCI XI COLUMN (DISPOSABLE) ×1
DRAPE SHEET LG 3/4 BI-LAMINATE (DRAPES) ×4 IMPLANT
DRAPE SURG IRRIG POUCH 19X23 (DRAPES) ×2 IMPLANT
ELECT REM PT RETURN 15FT ADLT (MISCELLANEOUS) ×2 IMPLANT
GLOVE BIO SURGEON STRL SZ 6 (GLOVE) ×8 IMPLANT
GLOVE BIO SURGEON STRL SZ 6.5 (GLOVE) ×4 IMPLANT
GOWN STRL REUS W/ TWL LRG LVL3 (GOWN DISPOSABLE) ×2 IMPLANT
GOWN STRL REUS W/TWL LRG LVL3 (GOWN DISPOSABLE) ×4
HOLDER FOLEY CATH W/STRAP (MISCELLANEOUS) ×2 IMPLANT
IRRIG SUCT STRYKERFLOW 2 WTIP (MISCELLANEOUS) ×2
IRRIGATION SUCT STRKRFLW 2 WTP (MISCELLANEOUS) ×1 IMPLANT
KIT BASIN OR (CUSTOM PROCEDURE TRAY) ×2 IMPLANT
KIT PROCEDURE DA VINCI SI (MISCELLANEOUS) ×1
KIT PROCEDURE DVNC SI (MISCELLANEOUS) IMPLANT
MANIPULATOR UTERINE 4.5 ZUMI (MISCELLANEOUS) ×2 IMPLANT
MARKER SKIN DUAL TIP RULER LAB (MISCELLANEOUS) ×2 IMPLANT
NDL SAFETY ECLIPSE 18X1.5 (NEEDLE) ×1 IMPLANT
NDL SPNL 18GX3.5 QUINCKE PK (NEEDLE) ×1 IMPLANT
NEEDLE HYPO 18GX1.5 SHARP (NEEDLE) ×2
NEEDLE SPNL 18GX3.5 QUINCKE PK (NEEDLE) ×2 IMPLANT
OBTURATOR OPTICAL STANDARD 8MM (TROCAR) ×1
OBTURATOR OPTICAL STND 8 DVNC (TROCAR) ×1
OBTURATOR OPTICALSTD 8 DVNC (TROCAR) ×1 IMPLANT
OCCLUDER COLPOPNEUMO (BALLOONS) ×2 IMPLANT
PAD POSITIONING PINK XL (MISCELLANEOUS) ×2 IMPLANT
PORT ACCESS TROCAR AIRSEAL 12 (TROCAR) ×1 IMPLANT
PORT ACCESS TROCAR AIRSEAL 5M (TROCAR) ×1
POUCH SPECIMEN RETRIEVAL 10MM (ENDOMECHANICALS) IMPLANT
SCISSORS ENDO CVD 5DCS (MISCELLANEOUS) ×1 IMPLANT
SEAL CANN UNIV 5-8 DVNC XI (MISCELLANEOUS) ×4 IMPLANT
SEAL XI 5MM-8MM UNIVERSAL (MISCELLANEOUS) ×4
SEALER TISSUE G2 STRG ARTC 35C (ENDOMECHANICALS) ×1 IMPLANT
SEALER VESSEL DA VINCI XI (MISCELLANEOUS)
SEALER VESSEL EXT DVNC XI (MISCELLANEOUS) IMPLANT
SET BI-LUMEN FLTR TB AIRSEAL (TUBING) ×1 IMPLANT
SET TRI-LUMEN FLTR TB AIRSEAL (TUBING) ×2 IMPLANT
SHEET LAVH (DRAPES) ×2 IMPLANT
SOLUTION ELECTROLUBE (MISCELLANEOUS) ×2 IMPLANT
SURGIFLO W/THROMBIN 8M KIT (HEMOSTASIS) IMPLANT
SUT MNCRL AB 4-0 PS2 18 (SUTURE) ×4 IMPLANT
SUT VIC AB 0 CT1 27 (SUTURE) ×2
SUT VIC AB 0 CT1 27XBRD ANTBC (SUTURE) ×1 IMPLANT
SYR 10ML LL (SYRINGE) ×2 IMPLANT
SYR 50ML LL SCALE MARK (SYRINGE) ×2 IMPLANT
TOWEL OR 17X26 10 PK STRL BLUE (TOWEL DISPOSABLE) ×4 IMPLANT
TOWEL OR NON WOVEN STRL DISP B (DISPOSABLE) ×2 IMPLANT
TRAP SPECIMEN MUCOUS 40CC (MISCELLANEOUS) IMPLANT
TRAY FOLEY W/METER SILVER 16FR (SET/KITS/TRAYS/PACK) ×2 IMPLANT
TRAY LAPAROSCOPIC (CUSTOM PROCEDURE TRAY) ×2 IMPLANT
TROCAR BLADELESS OPT 5 100 (ENDOMECHANICALS) ×2 IMPLANT
UNDERPAD 30X30 (UNDERPADS AND DIAPERS) ×2 IMPLANT
UNDERPAD 30X30 INCONTINENT (UNDERPADS AND DIAPERS) ×2 IMPLANT
WATER STERILE IRR 1500ML POUR (IV SOLUTION) ×4 IMPLANT

## 2016-01-18 NOTE — H&P (Signed)
Consult Note: Gyn-Onc  Consult was requested by Dr. Alfonse Spruce for the evaluation of Allison Spencer 56 y.o. female  CC:     Chief Complaint  Patient presents with  . endometrial adenocarcinoma    Assessment/Plan:  Ms. JAVIA GILIBERTO  is a 56 y.o.  year old with grade 1 endometrial cancer.  A detailed discussion was held with the patient and her family with regard to to her endometrial cancer diagnosis. We discussed the standard management options for uterine cancer which includes surgery followed possibly by adjuvant therapy depending on the results of surgery. The options for surgical management include a hysterectomy and removal of the tubes and ovaries possibly with removal of pelvic and para-aortic lymph nodes.If feasible, a minimally invasive approach including a robotic hysterectomy or laparoscopic hysterectomy have benefits including shorter hospital stay, recovery time and better wound healing than with open surgery. The patient has been counseled about these surgical options and the risks of surgery in general including infection, bleeding, damage to surrounding structures (including bowel, bladder, ureters, nerves or vessels), and the postoperative risks of PE/ DVT, and lymphedema. I extensively reviewed the additional risks of robotic hysterectomy including possible need for conversion to open laparotomy.  I discussed positioning during surgery of trendelenberg and risks of minor facial swelling and care we take in preoperative positioning.  After counseling and consideration of her options, she desires to proceed with robotic assisted total hysterectomy with bilateral sapingo-oophorectomy and SLN biopsy.   She will be seen by anesthesia for preoperative clearance and discussion of postoperative pain management.  She was given the opportunity to ask questions, which were answered to her satisfaction, and she is agreement with the above mentioned plan of care.  I discussed that she  is at increased risk for surgical complications due to her morbid obesity. I discussed that she may require mini-laparotomy for specimen delivery.  She will bring her CPAP machine for postop use.  She has issues with severe anxiety while coming out of anesthesia.   HPI: Allison Spencer is a 56 year old G3P3 who is seen in consultation at the request of Dr Alfonse Spruce for grade 1 endometrial cancer. She was for a routine pap smear by her PCP, Heide Scales, FNP on 07/05/15. This revealed atypical glandular cells - endometrial. She was then seen by Dr Alfonse Spruce on 11/24/15 and reported a single episode of light pink spotting but otherwise no bleeding.  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  The patient has had a recent history of a laparoscopic sigmoid colectomy for diverticulitis in 2016. She also has a history of a laparoscopic cholecystectomy and tubal ligation.  She has morbid obesity and severe sleep apnea.   Current Meds:      Outpatient Encounter Prescriptions as of 12/13/2015  Medication Sig  . B Complex Vitamins (VITAMIN-B COMPLEX) TABS Take by mouth.  . dicyclomine (BENTYL) 10 MG capsule Take 10 mg by mouth.  . escitalopram (LEXAPRO) 20 MG tablet TAKE ONE AND ONE HALF (1.5) TABLETS BY MOUTH EVERY MORNING  . ibuprofen (ADVIL,MOTRIN) 800 MG tablet TAKE 1 TABLET BY MOUTH 3 TIMES A DAY WITH FOOD OR MILK  . ketoconazole (NIZORAL) 2 % shampoo APPLY TO WASH TWICE WEEKLY  . losartan (COZAAR) 25 MG tablet Take 25 mg by mouth daily.  . metoprolol tartrate (LOPRESSOR) 25 MG tablet TAKE 1 TABLET BY MOUTH TWICE A DAY  . pantoprazole (PROTONIX) 40 MG  tablet TAKE 1 TABLET BY MOUTH DAILY  . pantoprazole (PROTONIX) 40 MG tablet Take 40 mg by mouth daily.  . pravastatin (PRAVACHOL) 20 MG tablet TAKE 1 TABLET BY MOUTH DAILY  . ranitidine (ZANTAC) 300 MG capsule Take 300 mg by mouth every evening.  . rifampin  (RIFADIN) 300 MG capsule Take 2 capsules (600 mg total) by mouth daily.  . traZODone (DESYREL) 100 MG tablet TAKE TWO AND ONE HALF (2.5) TABLETS BY MOUTH AT BEDTIME, AS NEEDED   No facility-administered encounter medications on file as of 12/13/2015.     Allergy:       Allergies  Allergen Reactions  . Codeine Nausea And Vomiting    UNKNOWN  . Sulfa Antibiotics     NAUSEA    Social Hx:   Social History        Social History  . Marital status: Single    Spouse name: N/A  . Number of children: N/A  . Years of education: N/A      Occupational History  . Not on file.        Social History Main Topics  . Smoking status: Former Smoker    Types: Cigarettes    Quit date: 12/05/1988  . Smokeless tobacco: Never Used  . Alcohol use Not on file  . Drug use: Unknown  . Sexual activity: Yes    Partners: Male       Other Topics Concern  . Not on file      Social History Narrative  . No narrative on file    Past Surgical Hx:       Past Surgical History:  Procedure Laterality Date  . CHOLECYSTECTOMY  2000  . COLECTOMY  2016  . TUBAL LIGATION  1991    Past Medical Hx:      Past Medical History:  Diagnosis Date  . Arthritis   . Depression   . Diverticulitis   . Endometrial cancer ([place])   . Essential hypertension   . Heart palpitations   . Hyperlipidemia   . Hyperlipidemia   . IBS (irritable bowel syndrome)   . OSA (obstructive sleep apnea)     Past Gynecological History:  SVD x 3 No LMP recorded. Patient is postmenopausal.  Family Hx:       Family History  Problem Relation Age of Onset  . COPD Mother   . CAD Father     Review of Systems:  Constitutional  Feels well,    ENT Normal appearing ears and nares bilaterally Skin/Breast  No rash, sores, jaundice, itching, dryness Cardiovascular  No chest pain, shortness of breath, or edema  Pulmonary  No cough or wheeze.  Gastro Intestinal  No nausea,  vomitting, or diarrhoea. No bright red blood per rectum, no abdominal pain, change in bowel movement, or constipation.  Genito Urinary  No frequency, urgency, dysuria, + vaginal spotting. Musculo Skeletal  No myalgia, arthralgia, joint swelling or pain  Neurologic  No weakness, numbness, change in gait,  Psychology  No depression, anxiety, insomnia.   Vitals:  Blood pressure (!) 147/91, pulse 63, temperature 97.8 F (36.6 C), temperature source Oral, resp. rate 18, height 5\' 3"  (1.6 m), weight 221 lb 3.2 oz (100.3 kg), SpO2 97 %.  Physical Exam: WD in NAD Neck  Supple NROM, without any enlargements.  Lymph Node Survey No cervical supraclavicular or inguinal adenopathy Cardiovascular  Pulse normal rate, regularity and rhythm. S1 and S2 normal.  Lungs  Clear to auscultation bilateraly, without wheezes/crackles/rhonchi. Good  air movement.  Skin  No rash/lesions/breakdown  Psychiatry  Alert and oriented to person, place, and time  Abdomen  Normoactive bowel sounds, abdomen soft, non-tender and obese without evidence of hernia.  Back No CVA tenderness Genito Urinary  Vulva/vagina: Normal external female genitalia.  No lesions. No discharge or bleeding.             Bladder/urethra:  No lesions or masses, well supported bladder             Vagina: normal             Cervix: Normal appearing, no lesions.             Uterus:  Slightly bulky, mobile, no parametrial involvement or nodularity.             Adnexa: no palpable masses. Rectal  deferred.  Extremities  No bilateral cyanosis, clubbing or edema.   Donaciano Eva, MD

## 2016-01-18 NOTE — Transfer of Care (Signed)
Immediate Anesthesia Transfer of Care Note  Patient: Allison Spencer  Procedure(s) Performed: Procedure(s): XI ROBOTIC ASSISTED TOTAL HYSTERECTOMY WITH BILATERAL SALPINGO OOPHORECTOMY WITH SENTINAL LYMPH NODE BIOPSY; LYSIS OF ADHESIONS (Bilateral)  Patient Location: PACU  Anesthesia Type:General  Level of Consciousness: sedated, patient cooperative and responds to stimulation  Airway & Oxygen Therapy: Patient Spontanous Breathing and Patient connected to face mask oxygen  Post-op Assessment: Report given to RN and Post -op Vital signs reviewed and stable  Post vital signs: Reviewed and stable  Last Vitals:  Vitals:   01/18/16 0620  BP: 135/86  Pulse: 67  Resp: 16  Temp: 36.8 C    Last Pain:  Vitals:   01/18/16 0620  TempSrc: Oral      Patients Stated Pain Goal: 4 ([id number])  Complications: No apparent anesthesia complications

## 2016-01-18 NOTE — Op Note (Signed)
OPERATIVE NOTE 01/18/16  Surgeon: Donaciano Eva   Assistants: Dr Lahoma Crocker (an MD assistant was necessary for tissue manipulation, management of robotic instrumentation, retraction and positioning due to the complexity of the case and hospital policies).   Anesthesia: General endotracheal anesthesia  ASA Class: 3   Pre-operative Diagnosis: grade 1 endometrial cancer  Post-operative Diagnosis: same  Operation: Robotic-assisted laparoscopic total hysterectomy with bilateral salpingoophorectomy, sentinel lymph node biopsy, lysis of adhesions.  Surgeon: Donaciano Eva  Assistant Surgeon: Lahoma Crocker MD  Anesthesia: GET  Urine Output: 300cc  Operative Findings:  : Dense adhesions between anterior abdominal wall and omentum. Adhesions between sigmoid colon and left ovary. 6cm uterus, normal appearing tubes and ovaries.   Estimated Blood Loss:  less than 50 mL      Total IV Fluids: 800 ml         Specimens: uterus, cervix, bilateral tubes and ovaries, right obturator SLN, left external iliac and obturator SLN.         Complications:  None; patient tolerated the procedure well.         Disposition: PACU - hemodynamically stable.  Procedure Details  The patient was seen in the Holding Room. The risks, benefits, complications, treatment options, and expected outcomes were discussed with the patient.  The patient concurred with the proposed plan, giving informed consent.  The site of surgery properly noted/marked. The patient was identified as Education officer, environmental and the procedure verified as a Robotic-assisted hysterectomy with bilateral salpingo oophorectomy with SLN biopsy. A Time Out was held and the above information confirmed.  After induction of anesthesia, the patient was draped and prepped in the usual sterile manner. Pt was placed in supine position after anesthesia and draped and prepped in the usual sterile manner. The abdominal drape was placed after  the CholoraPrep had been allowed to dry for 3 minutes.  Her arms were tucked to her side with all appropriate precautions.  The shoulders were stabilized with padded shoulder blocks applied to the acromium processes.  The patient was placed in the semi-lithotomy position in [place].  The perineum was prepped with Betadine. The patient was then prepped. Foley catheter was placed.  A sterile speculum was placed in the vagina.  The cervix was grasped with a single-tooth tenaculum and dilated with Kennon Rounds dilators. 1mg  total of ICG was injected into the cervical stroma at 2 and 9 o'clock at a 20mm depth (concentration 0..5mg /ml).   The ZUMI uterine manipulator with a medium colpotomizer ring was placed without difficulty.  A pneum occluder balloon was placed over the manipulator.  OG tube placement was confirmed and to suction.   Next, a 5 mm skin incision was made 1 cm below the subcostal margin in the midclavicular line.  The 5 mm Optiview port and scope was used for direct entry.  Opening pressure was under 10 mm CO2.  The abdomen was insufflated and the findings were noted as above.   At this point and all points during the procedure, the patient's intra-abdominal pressure did not exceed 15 mmHg. Due to the dense adhesions in the midline, the 2 left mid abdominal ports (83mm) were placed and sharp endoshears were placed and took down the adhesions sharply for 30 minutes of sharp adhesiolysis. Next, a 8 mm skin incision was made in the umbilicus and a right port was placed about 10 cm lateral to the robot port on the right side. All ports were placed under direct visualization.  The patient  was placed in steep Trendelenburg.  Bowel was folded away into the upper abdomen.  The robot was docked in the normal manner.  The right and left peritoneum were opened parallel to the IP ligament to open the retroperitoneal spaces bilaterally. The SLN mapping was performed in bilateral pelvic basins. The para rectal and  paravesical spaces were opened up. Lymphatic channels were identified travelling to the following visualized sentinel lymph node's: right obturator and left obturator and external iliac. These SLN's were separated from their surrounding lymphatic tissue, removed and sent for permanent pathology.  The hysterectomy was started after the round ligament on the right side was incised and the retroperitoneum was entered and the pararectal space was developed.  The ureter was noted to be on the medial leaf of the broad ligament.  The peritoneum above the ureter was incised and stretched and the infundibulopelvic ligament was skeletonized, cauterized and cut.  The posterior peritoneum was taken down to the level of the KOH ring.  The anterior peritoneum was also taken down.  The bladder flap was created to the level of the KOH ring.  The uterine artery on the right side was skeletonized, cauterized and cut in the normal manner.  A similar procedure was performed on the left.  The colpotomy was made and the uterus, cervix, bilateral ovaries and tubes were amputated and delivered through the vagina.  Pedicles were inspected and excellent hemostasis was achieved.    The colpotomy at the vaginal cuff was closed with Vicryl on a CT1 needle in a running manner.  Irrigation was used and excellent hemostasis was achieved.  At this point in the procedure was completed.  Robotic instruments were removed under direct visulaization.  The robot was undocked. The 10 mm ports were closed with Vicryl on a UR-5 needle and the fascia was closed with 0 Vicryl on a UR-5 needle.  The skin was closed with 4-0 Vicryl in a subcuticular manner.  Dermabond was applied.  Sponge, lap and needle counts correct x 2.  The patient was taken to the recovery room in stable condition.  The vagina was swabbed with  minimal bleeding noted.   All instrument and needle counts were correct x  3.   The patient was transferred to the recovery room in a  stable condition.  Donaciano Eva, MD

## 2016-01-18 NOTE — Anesthesia Postprocedure Evaluation (Signed)
Anesthesia Post Note  Patient: Ricarda L Bober  Procedure(s) Performed: Procedure(s) (LRB): XI ROBOTIC ASSISTED TOTAL HYSTERECTOMY WITH BILATERAL SALPINGO OOPHORECTOMY WITH SENTINAL LYMPH NODE BIOPSY; LYSIS OF ADHESIONS (Bilateral)  Patient location during evaluation: PACU Anesthesia Type: General Level of consciousness: awake, awake and alert and oriented Pain management: pain level controlled Vital Signs Assessment: post-procedure vital signs reviewed and stable Respiratory status: spontaneous breathing and respiratory function stable Cardiovascular status: blood pressure returned to baseline Anesthetic complications: no       Last Vitals:  Vitals:   01/18/16 1336 01/18/16 1450  BP: (!) 124/93 125/71  Pulse: 66 71  Resp: 16 16  Temp: 36.6 C 36.7 C    Last Pain:  Vitals:   01/18/16 1512  TempSrc:   PainSc: 5                  Jakota Manthei COKER

## 2016-01-18 NOTE — Anesthesia Procedure Notes (Signed)
Procedure Name: Intubation Performed by: Gean Maidens Pre-anesthesia Checklist: Patient identified, Emergency Drugs available, Suction available, Patient being monitored and Timeout performed Patient Re-evaluated:Patient Re-evaluated prior to inductionOxygen Delivery Method: Circle system utilized Preoxygenation: Pre-oxygenation with 100% oxygen Intubation Type: IV induction Ventilation: Mask ventilation without difficulty Laryngoscope Size: Mac and 4 Grade View: Grade I Tube type: Oral Tube size: 7.0 mm Number of attempts: 1 Airway Equipment and Method: Stylet Placement Confirmation: ETT inserted through vocal cords under direct vision,  positive ETCO2,  CO2 detector and breath sounds checked- equal and bilateral Secured at: 21 cm Tube secured with: Tape Dental Injury: Teeth and Oropharynx as per pre-operative assessment

## 2016-01-18 NOTE — Anesthesia Preprocedure Evaluation (Addendum)
Anesthesia Evaluation  Patient identified by MRN, date of birth, ID band Patient awake    Reviewed: Allergy & Precautions, NPO status , Patient's Chart, lab work & pertinent test results  Airway Mallampati: II  TM Distance: >3 FB Neck ROM: Full    Dental  (+) Edentulous Upper   Pulmonary former smoker,    breath sounds clear to auscultation       Cardiovascular hypertension,  Rhythm:Regular Rate:Normal     Neuro/Psych    GI/Hepatic   Endo/Other    Renal/GU      Musculoskeletal   Abdominal   Peds  Hematology   Anesthesia Other Findings   Reproductive/Obstetrics                            Anesthesia Physical Anesthesia Plan  ASA: III  Anesthesia Plan: General   Post-op Pain Management:    Induction: Intravenous  Airway Management Planned: Oral ETT  Additional Equipment:   Intra-op Plan:   Post-operative Plan: Extubation in OR  Informed Consent: I have reviewed the patients History and Physical, chart, labs and discussed the procedure including the risks, benefits and alternatives for the proposed anesthesia with the patient or authorized representative who has indicated his/her understanding and acceptance.   Dental advisory given  Plan Discussed with: CRNA and Anesthesiologist  Anesthesia Plan Comments:        Anesthesia Quick Evaluation

## 2016-01-18 NOTE — Discharge Instructions (Signed)
01/18/2016  Return to work: 4 weeks  Activity: 1. Be up and out of the bed during the day.  Take a nap if needed.  You may walk up steps but be careful and use the hand rail.  Stair climbing will tire you more than you think, you may need to stop part way and rest.   2. No lifting or straining for 6 weeks.  3. No driving for 2 weeks.  Do Not drive if you are taking narcotic pain medicine.  4. Shower daily.  Use soap and water on your incision and pat dry; don't rub.   5. No sexual activity and nothing in the vagina for 8 weeks.  Diet: 1. Low sodium Heart Healthy Diet is recommended.  2. It is safe to use a laxative if you have difficulty moving your bowels.   Wound Care: 1. Keep clean and dry.  Shower daily.  Reasons to call the Doctor:   Fever - Oral temperature greater than 100.4 degrees Fahrenheit  Foul-smelling vaginal discharge  Difficulty urinating  Nausea and vomiting  Increased pain at the site of the incision that is unrelieved with pain medicine.  Difficulty breathing with or without chest pain  New calf pain especially if only on one side  Sudden, continuing increased vaginal bleeding with or without clots.   Follow-up: 1. See Everitt Amber in 3 weeks.  Contacts: For questions or concerns you should contact:  Dr. Everitt Amber at [phone]  or at [place]

## 2016-01-19 ENCOUNTER — Other Ambulatory Visit: Payer: Self-pay | Admitting: Gynecologic Oncology

## 2016-01-19 DIAGNOSIS — G8918 Other acute postprocedural pain: Secondary | ICD-10-CM

## 2016-01-19 DIAGNOSIS — C541 Malignant neoplasm of endometrium: Secondary | ICD-10-CM | POA: Diagnosis not present

## 2016-01-19 LAB — CBC
HCT: 37.7 % (ref 36.0–46.0)
Hemoglobin: 12.4 g/dL (ref 12.0–15.0)
MCH: 29.5 pg (ref 26.0–34.0)
MCHC: 32.9 g/dL (ref 30.0–36.0)
MCV: 89.8 fL (ref 78.0–100.0)
Platelets: 270 K/uL (ref 150–400)
RBC: 4.2 MIL/uL (ref 3.87–5.11)
RDW: 13.7 % (ref 11.5–15.5)
WBC: 14.5 K/uL — ABNORMAL HIGH (ref 4.0–10.5)

## 2016-01-19 LAB — BASIC METABOLIC PANEL WITH GFR
Anion gap: 5 (ref 5–15)
BUN: 7 mg/dL (ref 6–20)
CO2: 31 mmol/L (ref 22–32)
Calcium: 8.4 mg/dL — ABNORMAL LOW (ref 8.9–10.3)
Chloride: 105 mmol/L (ref 101–111)
Creatinine, Ser: 0.83 mg/dL (ref 0.44–1.00)
GFR calc Af Amer: >60 mL/min
GFR calc non Af Amer: >60 mL/min
Glucose, Bld: 123 mg/dL — ABNORMAL HIGH (ref 65–99)
Potassium: 4.1 mmol/L (ref 3.5–5.1)
Sodium: 141 mmol/L (ref 135–145)

## 2016-01-19 MED ORDER — OXYCODONE-ACETAMINOPHEN 5-325 MG PO TABS: 1.0000 | ORAL_TABLET | ORAL | 0 refills | Status: DC | PRN

## 2016-01-19 MED ORDER — HYDROMORPHONE HCL 2 MG PO TABS: 2.0000 mg | ORAL_TABLET | Freq: Four times a day (QID) | ORAL | 0 refills | Status: DC | PRN

## 2016-01-19 NOTE — Progress Notes (Signed)
Patient called and wanted percocet for home script.

## 2016-01-19 NOTE — Discharge Summary (Signed)
Physician Discharge Summary  Patient ID: Allison Spencer MRN: [id number] DOB/AGE: 04-03-1960 56 y.o.  Admit date: 01/18/2016 Discharge date: 01/19/2016  Admission Diagnoses: Endometrial cancer [facility])  Discharge Diagnoses:  Principal Problem:   Endometrial cancer [facility])   Discharged Condition:  The patient is in good condition and stable for discharge.    Hospital Course: On 01/18/2016, the patient underwent the following: Procedure(s): XI ROBOTIC ASSISTED TOTAL HYSTERECTOMY WITH BILATERAL SALPINGO OOPHORECTOMY WITH SENTINAL LYMPH NODE BIOPSY; LYSIS OF ADHESIONS.   The postoperative course was uneventful.  She was discharged to home on postoperative day 1 tolerating a regular diet, voiding, passing flatus, pain controlled, ambulating without difficulty.  Consults: None  Significant Diagnostic Studies: None  Treatments: surgery: see above  Discharge Exam: Blood pressure 109/71, pulse 76, temperature 97.1 F (36.2 C), temperature source Oral, resp. rate 16, height 5\' 3"  (1.6 m), weight 217 lb (98.4 kg), SpO2 100 %. General appearance: alert, cooperative, appears stated age and no distress Resp: clear to auscultation bilaterally Cardio: regular rate and rhythm, S1, S2 normal, no murmur, click, rub or gallop GI: soft, non-tender; bowel sounds normal; no masses,  no organomegaly and abdomen obese Extremities: extremities normal, atraumatic, no cyanosis or edema Incision/Wound: Lap sites to the abdomen with dermabond without erythema or drainage  Disposition: Home  Discharge Instructions    Call MD for:  difficulty breathing, headache or visual disturbances    Complete by:  As directed    Call MD for:  extreme fatigue    Complete by:  As directed    Call MD for:  hives    Complete by:  As directed    Call MD for:  persistant dizziness or light-headedness    Complete by:  As directed    Call MD for:  persistant nausea and vomiting    Complete by:  As directed    Call MD for:   redness, tenderness, or signs of infection (pain, swelling, redness, odor or green/yellow discharge around incision site)    Complete by:  As directed    Call MD for:  severe uncontrolled pain    Complete by:  As directed    Call MD for:  temperature >100.4    Complete by:  As directed    Diet - low sodium heart healthy    Complete by:  As directed    Driving Restrictions    Complete by:  As directed    No driving for 1 week.  Do not take narcotics and drive.   Increase activity slowly    Complete by:  As directed    Lifting restrictions    Complete by:  As directed    No lifting greater than 10 lbs.   Sexual Activity Restrictions    Complete by:  As directed    No sexual activity, nothing in the vagina, for 8 weeks.     Allergies as of 01/19/2016      Reactions   Codeine Nausea And Vomiting   Sulfa Antibiotics Nausea And Vomiting      Medication List    TAKE these medications   dicyclomine 10 MG capsule Commonly known as:  BENTYL Take 10 mg by mouth 2 (two) times daily as needed for spasms.   escitalopram 20 MG tablet Commonly known as:  LEXAPRO TAKE ONE AND ONE HALF (1.5) TABLETS BY MOUTH EVERY MORNING   HYDROmorphone 2 MG tablet Commonly known as:  DILAUDID Take 1 tablet (2 mg total) by mouth every 6 (six)  hours as needed for severe pain.   ibuprofen 800 MG tablet Commonly known as:  ADVIL,MOTRIN TAKE 1 TABLET BY MOUTH 3 TIMES A DAY AS NEEDED FOR PAIN. TAKE WITH FOOD OR MILK   ketoconazole 2 % shampoo Commonly known as:  NIZORAL APPLY TO WASH TWICE WEEKLY   losartan 25 MG tablet Commonly known as:  COZAAR Take 25 mg by mouth daily.   metoprolol tartrate 25 MG tablet Commonly known as:  LOPRESSOR TAKE 1 TABLET BY MOUTH TWICE A DAY   pantoprazole 40 MG tablet Commonly known as:  PROTONIX Take 80 mg by mouth daily.   pravastatin 20 MG tablet Commonly known as:  PRAVACHOL TAKE 1 TABLET BY MOUTH DAILY   ranitidine 300 MG capsule Commonly known as:   ZANTAC Take 300 mg by mouth 2 (two) times daily as needed for heartburn.   rifampin 300 MG capsule Commonly known as:  RIFADIN Take 2 capsules (600 mg total) by mouth daily. What changed:  when to take this   sucralfate 1 GM/10ML suspension Commonly known as:  CARAFATE Take 1 g by mouth 2 (two) times daily. With meals   Vitamin-B Complex Tabs Take 1 tablet by mouth daily.      Follow-up Information    Follow up Follow up in 3 week(s).        Donaciano Eva, MD Follow up.   Specialty:  Obstetrics and Gynecology Contact information: [address] [place] [address] [phone]           Greater than thirty minutes were spend for face to face discharge instructions and discharge orders/summary in EPIC.   Signed: Laquanda Bick DEAL 01/19/2016, 8:27 AM

## 2016-01-23 ENCOUNTER — Telehealth: Payer: Self-pay | Admitting: Gynecologic Oncology

## 2016-01-23 NOTE — Telephone Encounter (Signed)
Informed patient of findings of stage IIIC1 endometrial cancer. DIscussed that she will require adjuvant chemotherapy for this disease. DIscussed prognosis of this disease.  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  Donaciano Eva, MD

## 2016-01-23 NOTE — Telephone Encounter (Signed)
I will work her in next week When do you anticipate she would be ready for chemo (from wound healing stand point)?

## 2016-01-24 ENCOUNTER — Telehealth: Payer: Self-pay | Admitting: Hematology and Oncology

## 2016-01-24 NOTE — Telephone Encounter (Signed)
Appt has been scheduled  W/Gorsuch on 1/23 at 12pm. Pt agreed to the appt date and time.

## 2016-01-29 ENCOUNTER — Encounter: Payer: Self-pay | Admitting: Gynecologic Oncology

## 2016-01-29 NOTE — Progress Notes (Signed)
Gynecologic Oncology Multi-Disciplinary Disposition Conference Note  Date of the Conference: January 29, 2016  Patient Name: Allison Spencer  Referring Provider: Dr. Alfonse Spruce Primary GYN Oncologist: Dr. Everitt Amber  Stage/Disposition:  Stage IIIC1 endometrial cancer.  Disposition is to adjuvant 6 cycles of carboplatin and taxol.  PET scan prior to initiating therapy.  Genetics referral.  This Multidisciplinary conference took place involving physicians from Gynecologic Oncology, Medical Oncology, Radiation Oncology, Pathology, Radiology along with the Gynecologic Oncology Nurse Practitioner and RN.  Comprehensive assessment of the patient's malignancy, staging, need for surgery, chemotherapy, radiation therapy, and need for further testing were reviewed. Supportive measures, both inpatient and following discharge were also discussed. The recommended plan of care is documented. Greater than 35 minutes were spent correlating and coordinating this patient's care.

## 2016-01-30 ENCOUNTER — Encounter: Payer: Self-pay | Admitting: Hematology and Oncology

## 2016-01-30 ENCOUNTER — Ambulatory Visit (HOSPITAL_BASED_OUTPATIENT_CLINIC_OR_DEPARTMENT_OTHER): Payer: Medicaid Other | Admitting: Hematology and Oncology

## 2016-01-30 VITALS — BP 130/83 | HR 82 | Temp 97.9°F | Resp 18 | Ht 63.0 in | Wt 213.7 lb

## 2016-01-30 DIAGNOSIS — K589 Irritable bowel syndrome without diarrhea: Secondary | ICD-10-CM | POA: Diagnosis not present

## 2016-01-30 DIAGNOSIS — Z227 Latent tuberculosis: Secondary | ICD-10-CM

## 2016-01-30 DIAGNOSIS — C541 Malignant neoplasm of endometrium: Secondary | ICD-10-CM

## 2016-01-30 DIAGNOSIS — R7611 Nonspecific reaction to tuberculin skin test without active tuberculosis: Secondary | ICD-10-CM | POA: Diagnosis not present

## 2016-01-30 NOTE — Assessment & Plan Note (Signed)
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 pylori infection I will be started on triple therapy for that Given her strong family history of cancer and presence of MSI instability in the pathology report, genetic counseling and testing is recommended. The patient agreed to be referred to counselor She has appointment pending to see her surgeon next week I will call her afterwards to see if she is willing to undergo further evaluation with PET CT scan staging and adjuvant treatment. 

## 2016-01-30 NOTE — Assessment & Plan Note (Signed)
She is currently receiving antibiotic therapy for this Certainly, there would be significant drug to drug interaction between chemotherapy and antimicrobial therapy I will certainly get in touch with her infectious disease physician and pharmacy with dose modification for treatment

## 2016-01-30 NOTE — Progress Notes (Signed)
[facility] NOTE  Patient Care Team: Imagene Riches, NP as PCP - General Gavin Pound, MD as Consulting Physician (Rheumatology)  CHIEF COMPLAINTS/PURPOSE OF CONSULTATION:  Endometrial cancer status post surgical resection  HISTORY OF PRESENTING ILLNESS:  Allison Spencer 56 y.o. female is here because of recent diagnosis of cancer. I reviewed her records extensively and her case was extensively reviewed at the GYN Oncology tumor board yesterday Her initial presentation was related to abnormal Pap smear and vaginal bleeding. Summary of oncologic history as follows:   Endometrial cancer ([place])   07/05/2015 Pathology Results    She had abnormal PAP      11/24/2015 Procedure    She underwent endometrial sampling that came back abnormal      01/10/2016 Procedure    She had EGD which showed esophagitis      01/18/2016 Pathology Results    Diagnosis 1. Lymph node, sentinel, biopsy, right obturator - MICROMETASTASIS IN ONE OF ONE LYMPH NODES (1/1). 2. Lymph node, sentinel, biopsy, left external - ONE OF ONE LYMPH NODES NEGATIVE FOR CARCINOMA (0/1). 3. Lymph node, sentinel, biopsy, left obturator - MICROMETASTASIS IN ONE OF ONE LYMPH NODES (1/1). 4. Uterus +/- tubes/ovaries, neoplastic - UTERUS: -ENDOMYOMETRIUM: ENDOMETRIOID ADENOCARCINOMA, FIGO GRADE 1, SPANNING 3.3 CM. TUMOR INVADES LESS THAN 1/2 OF THE MYOMETRIUM. SEE ONCOLOGY TABLE. -SEROSA: UNREMARKABLE. NO MALIGNANCY IDENTIFIED. - CERVIX: BENIGN SQUAMOUS AND ENDOCERVICAL MUCOSA. NO DYSPLASIA OR MALIGNANCY. - BILATERAL OVARIES: INCLUSION CYSTS. NO MALIGNANCY. - BILATERAL FALLOPIAN TUBES: UNREMARKABLE RIGHT TUBE. LEFT TUBE IS NOT IDENTIFIED GROSSLY OR MICROSCOPICALLY.      01/18/2016 Surgery    She underwent robotic-assisted laparoscopic total hysterectomy with bilateral salpingoophorectomy, sentinel lymph node biopsy, lysis of adhesions      Interestingly, the patient shared with me that she had been  "sick for a while" She had been on multiple courses of antibiotics for the last 2 years for recurrent urinary tract infection She was seen by infectious disease consultant last year for diagnosis of latent TB and is been placed on antibiotic therapy Recently, on 01/10/2016, she underwent upper endoscopy and was diagnosed with severe esophagitis and esophageal ulcer Biopsy show evidence of H. pylori and she was told she needs more antibiotics Since surgery, she complained of mild intermittent discomfort in the pelvic region It is not severe enough to warrant pain medicine She has occasional diarrhea alternate with constipation but she felt it is more related to her diagnosis of irritable bowel syndrome The patient have diagnosis of obstructive sleep apnea but was not able to use a C Pap machine for a while She denies recent cough, chest pain or shortness of breath  MEDICAL HISTORY:  Past Medical History:  Diagnosis Date  . Arthritis   . Complication of anesthesia 01/10/2016   when [place] feels like lump in throat had endo for bur feeling continues  . Depression   . Diverticulitis   . Endometrial cancer ([place])   . Essential hypertension   . 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 GERD (gastroesophageal reflux disease)   . H. pylori infection   . Headache    migraines  . Heart palpitations   . History of hiatal hernia   . Hyperlipidemia   . IBS (irritable bowel syndrome)   . OSA (obstructive sleep apnea)    not used  last 3 weeks due to cold cpap setting of 11  . Urinary incontinence  SURGICAL HISTORY: Past Surgical History:  Procedure Laterality Date  . CHOLECYSTECTOMY  2000  . COLECTOMY  2016  . colonscopy    . ROBOTIC ASSISTED TOTAL HYSTERECTOMY WITH BILATERAL SALPINGO OOPHERECTOMY Bilateral 01/18/2016   Procedure: XI ROBOTIC ASSISTED TOTAL HYSTERECTOMY WITH BILATERAL SALPINGO OOPHORECTOMY WITH SENTINAL LYMPH NODE BIOPSY; LYSIS OF  ADHESIONS;  Surgeon: Everitt Amber, MD;  Location: [facility];  Service: Gynecology;  Laterality: Bilateral;  . TUBAL LIGATION  1991  . UPPER GI ENDOSCOPY  01/10/2016    SOCIAL HISTORY: Social History   Social History  . Marital status: Married    Spouse name: Richard  . Number of children: 3  . Years of education: N/A   Occupational History  . Non-working, applying disability    Social History Main Topics  . Smoking status: Former Smoker    Packs/day: 1.00    Years: 10.00    Types: Cigarettes    Quit date: 12/05/1988  . Smokeless tobacco: Never Used  . Alcohol use No  . Drug use: No  . Sexual activity: Yes    Partners: Male   Other Topics Concern  . Not on file   Social History Narrative   Taking care of her grandson who has autism, 60 years old    FAMILY HISTORY: Family History  Problem Relation Age of Onset  . COPD Mother   . CAD Father   . Cancer Maternal Grandmother 80    breast ca  . Cancer Maternal Aunt     lung ca  . Cancer Maternal Uncle     brain ca    ALLERGIES:  is allergic to codeine and sulfa antibiotics.  MEDICATIONS:  Current Outpatient Prescriptions  Medication Sig Dispense Refill  . B Complex Vitamins (VITAMIN-B COMPLEX) TABS Take 1 tablet by mouth daily.     Marland Kitchen dicyclomine (BENTYL) 10 MG capsule Take 10 mg by mouth 2 (two) times daily as needed for spasms.     Marland Kitchen escitalopram (LEXAPRO) 20 MG tablet TAKE ONE AND ONE HALF (1.5) TABLETS BY MOUTH EVERY MORNING  1  . ibuprofen (ADVIL,MOTRIN) 800 MG tablet TAKE 1 TABLET BY MOUTH 3 TIMES A DAY AS NEEDED FOR PAIN. TAKE WITH FOOD OR MILK  3  . ketoconazole (NIZORAL) 2 % shampoo APPLY TO WASH TWICE WEEKLY  0  . losartan (COZAAR) 25 MG tablet Take 25 mg by mouth daily.  0  . metoprolol tartrate (LOPRESSOR) 25 MG tablet TAKE 1 TABLET BY MOUTH TWICE A DAY    . pantoprazole (PROTONIX) 40 MG tablet Take 40 mg by mouth 2 (two) times daily.   11  . pravastatin (PRAVACHOL) 20 MG tablet TAKE 1 TABLET BY MOUTH DAILY     . rifampin (RIFADIN) 300 MG capsule Take 2 capsules (600 mg total) by mouth daily. (Patient taking differently: Take 600 mg by mouth every evening. ) 60 capsule 3  . sucralfate (CARAFATE) 1 GM/10ML suspension Take 1 g by mouth 2 (two) times daily. With meals    . fluticasone (FLONASE) 50 MCG/ACT nasal spray INSTILL 2 SPRAYS INTO EACH NOSTRIL ONCE A DAY  0  . HYDROmorphone (DILAUDID) 2 MG tablet Take 1 tablet (2 mg total) by mouth every 6 (six) hours as needed for severe pain. (Patient not taking: Reported on 01/30/2016) 30 tablet 0  . oxyCODONE-acetaminophen (PERCOCET/ROXICET) 5-325 MG tablet Take 1-2 tablets by mouth every 4 (four) hours as needed for severe pain. (Patient not taking: Reported on 01/30/2016) 30 tablet 0  . ranitidine (ZANTAC)  300 MG capsule Take 300 mg by mouth 2 (two) times daily as needed for heartburn.      No current facility-administered medications for this visit.     REVIEW OF SYSTEMS:   Constitutional: Denies fevers, chills or abnormal night sweats Eyes: Denies blurriness of vision, double vision or watery eyes Ears, nose, mouth, throat, and face: Denies mucositis or sore throat Respiratory: Denies cough, dyspnea or wheezes Cardiovascular: Denies palpitation, chest discomfort or lower extremity swelling Skin: Denies abnormal skin rashes Lymphatics: Denies new lymphadenopathy or easy bruising Neurological:Denies numbness, tingling or new weaknesses Behavioral/Psych: Mood is stable, no new changes  All other systems were reviewed with the patient and are negative.  PHYSICAL EXAMINATION: ECOG PERFORMANCE STATUS: 1 - Symptomatic but completely ambulatory  Vitals:   01/30/16 1216  BP: 130/83  Pulse: 82  Resp: 18  Temp: 97.9 F (36.6 C)   Filed Weights   01/30/16 1216  Weight: 213 lb 11.2 oz (96.9 kg)    GENERAL:alert, no distress and comfortable SKIN: skin color, texture, turgor are normal, no rashes or significant lesions EYES: normal, conjunctiva are  pink and non-injected, sclera clear OROPHARYNX:no exudate, no erythema and lips, buccal mucosa, and tongue normal  NECK: supple, thyroid normal size, non-tender, without nodularity LYMPH:  no palpable lymphadenopathy in the cervical, axillary or inguinal LUNGS: clear to auscultation and percussion with normal breathing effort HEART: regular rate & rhythm and no murmurs and no lower extremity edema ABDOMEN:abdomen soft, non-tender and normal bowel sounds. Noted well-healed surgical scars Musculoskeletal:no cyanosis of digits and no clubbing  PSYCH: alert & oriented x 3 with fluent speech NEURO: no focal motor/sensory deficits  LABORATORY DATA:  I have reviewed the data as listed Lab Results  Component Value Date   WBC 14.5 (H) 01/19/2016   HGB 12.4 01/19/2016   HCT 37.7 01/19/2016   MCV 89.8 01/19/2016   PLT 270 01/19/2016    Recent Labs  12/06/15 1022 01/15/16 0930 01/19/16 0443  NA 139 139 141  K 4.1 3.9 4.1  CL 105 106 105  CO2 '26 27 31  '$ GLUCOSE 100* 100* 123*  BUN '15 11 7  '$ CREATININE 0.87 0.81 0.83  CALCIUM 9.2 8.9 8.4*  GFRNONAA  --  >60 >60  GFRAA  --  >60 >60  PROT 6.7 6.8  --   ALBUMIN 3.7 3.6  --   AST 13 16  --   ALT 10 16  --   ALKPHOS 132* 122  --   BILITOT 0.4 0.3  --    ASSESSMENT & PLAN:  Endometrial cancer ([place]) I had a long discussion with the patient We discussed the importance of staging scan with imaging study and adjuvant chemotherapy The patient is very concerned about treatment She felt that she is too ill and weak to pursue further treatment When I ask to elaborate further, the patient shared with me that she has been treated with multiple courses of antibiotic therapy for the last 2 years for recurrent urinary tract infection Recently, she was evaluated infectious disease for latent TB and is currently on treatment for that She had recent EGD and biopsy and was told she had H. pylori infection I will be started on triple therapy for  that Given her strong family history of cancer and presence of MSI instability in the pathology report, genetic counseling and testing is recommended. The patient agreed to be referred to counselor She has appointment pending to see her surgeon next week I will call  her afterwards to see if she is willing to undergo further evaluation with PET CT scan staging and adjuvant treatment.  LTBI (latent tuberculosis infection) She is currently receiving antibiotic therapy for this Certainly, there would be significant drug to drug interaction between chemotherapy and antimicrobial therapy I will certainly get in touch with her infectious disease physician and pharmacy with dose modification for treatment  Orders Placed This Encounter  Procedures  . Ambulatory referral to Genetics    Referral Priority:   Routine    Referral Type:   Consultation    Referral Reason:   Specialty Services Required    Number of Visits Requested:   1     All questions were answered. The patient knows to call the clinic with any problems, questions or concerns. I spent 60 minutes counseling the patient face to face. The total time spent in the appointment was 80 minutes and more than 50% was on counseling.     Heath Lark, MD 01/30/2016 4:04 PM

## 2016-01-31 ENCOUNTER — Other Ambulatory Visit: Payer: Self-pay | Admitting: Gynecologic Oncology

## 2016-01-31 ENCOUNTER — Telehealth: Payer: Self-pay

## 2016-01-31 DIAGNOSIS — R198 Other specified symptoms and signs involving the digestive system and abdomen: Secondary | ICD-10-CM

## 2016-01-31 DIAGNOSIS — C541 Malignant neoplasm of endometrium: Secondary | ICD-10-CM

## 2016-01-31 DIAGNOSIS — K6289 Other specified diseases of anus and rectum: Secondary | ICD-10-CM

## 2016-01-31 NOTE — Telephone Encounter (Addendum)
Pt c/o having rectal pain with bowel movements, stated its a shooting pain, having daily bowel movements, had diarrhea 2 days ago, currently having formed stool, also noted feeling rectal pressure at random. Denies bleeding. Dr Denman George has ordered additional imaging of a PET scan for the patient. This will be scheduled ASAP. Pt aware to contact office if symptoms worsen.

## 2016-02-01 ENCOUNTER — Telehealth: Payer: Self-pay | Admitting: Genetic Counselor

## 2016-02-01 ENCOUNTER — Encounter: Payer: Self-pay | Admitting: Genetic Counselor

## 2016-02-01 NOTE — Telephone Encounter (Signed)
Patient confirmed appt, mailed pt letter

## 2016-02-05 ENCOUNTER — Telehealth: Payer: Self-pay | Admitting: Gynecologic Oncology

## 2016-02-05 ENCOUNTER — Other Ambulatory Visit: Payer: Self-pay | Admitting: Gynecologic Oncology

## 2016-02-05 DIAGNOSIS — C541 Malignant neoplasm of endometrium: Secondary | ICD-10-CM

## 2016-02-05 NOTE — Telephone Encounter (Signed)
Attempted to do peer to peer on CT chest.  Per Dr. Fay Records, Medical Director, CT chest is not approved due to the cell type even with nodal disease.  Dr. Denman George will be notified.

## 2016-02-05 NOTE — Telephone Encounter (Signed)
Spoke with patient about Chest xray and CT AP scheduled for Feb 1 at [place] to pick up her prep kit the day before at [place].  Advised to arrive at 2:30pm for a 3pm CT appt.  "I need to talk to Brandt about this chemo.  I am not sure about it.  My immune system is not the best."  Advised to proceed with the scans and we will discuss recommendations, etc at her visit on 2/5.  She states the pain has improved some when having bowel movements but it is still present.  She started the abxs for H Pylori today that were prescribed before surgery.  Advised to call for any needs or concerns.

## 2016-02-07 ENCOUNTER — Encounter: Payer: Self-pay | Admitting: Genetic Counselor

## 2016-02-07 ENCOUNTER — Ambulatory Visit (HOSPITAL_BASED_OUTPATIENT_CLINIC_OR_DEPARTMENT_OTHER): Payer: Medicaid Other | Admitting: Genetic Counselor

## 2016-02-07 ENCOUNTER — Other Ambulatory Visit: Payer: Medicaid Other

## 2016-02-07 DIAGNOSIS — C541 Malignant neoplasm of endometrium: Secondary | ICD-10-CM | POA: Diagnosis not present

## 2016-02-07 DIAGNOSIS — Z803 Family history of malignant neoplasm of breast: Secondary | ICD-10-CM | POA: Insufficient documentation

## 2016-02-07 DIAGNOSIS — Z315 Encounter for genetic counseling: Secondary | ICD-10-CM

## 2016-02-07 DIAGNOSIS — Z800 Family history of malignant neoplasm of digestive organs: Secondary | ICD-10-CM | POA: Diagnosis not present

## 2016-02-07 DIAGNOSIS — Z808 Family history of malignant neoplasm of other organs or systems: Secondary | ICD-10-CM

## 2016-02-07 NOTE — Progress Notes (Signed)
REFERRING PROVIDER: Imagene Riches, NP [place],  [id number]   Heath Lark, MD  PRIMARY PROVIDER:  Imagene Riches, NP  PRIMARY REASON FOR VISIT:  1. Endometrial cancer ([place])   2. Family history of colon cancer   3. Family history of brain cancer   4. Family history of breast cancer      HISTORY OF PRESENT ILLNESS:   Allison Spencer, a 56 y.o. female, was seen for a [facility] cancer genetics consultation at the request of Dr. Alvy Bimler due to a personal and family history of cancer.  Allison Spencer presents to clinic today to discuss the possibility of a hereditary predisposition to cancer, genetic testing, and to further clarify her future cancer risks, as well as potential cancer risks for family members.   In 2017, at the age of 59, Allison Spencer was diagnosed with Endometrioid Endometrial cancer. This was treated with a TAH/BSO.  Micrometastasis was identified in several lymph nodes, and therefore six rounds of chemotherapy will be needed.  No radiation is scheduled at this time due to potential degradation of staples. 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     CANCER HISTORY:    Endometrial cancer ([place])   07/05/2015 Pathology Results    She had abnormal PAP      11/24/2015 Procedure    She underwent endometrial sampling that came back abnormal      01/10/2016 Procedure    She had EGD which showed esophagitis      01/18/2016 Pathology Results    Diagnosis 1. Lymph node, sentinel, biopsy, right obturator - MICROMETASTASIS IN ONE OF ONE LYMPH NODES (1/1). 2. Lymph node, sentinel, biopsy, left external - ONE OF ONE LYMPH NODES NEGATIVE FOR CARCINOMA (0/1). 3. Lymph node, sentinel, biopsy, left obturator - MICROMETASTASIS IN ONE OF ONE LYMPH NODES (1/1). 4. Uterus +/- tubes/ovaries, neoplastic - UTERUS: -ENDOMYOMETRIUM: ENDOMETRIOID ADENOCARCINOMA, FIGO GRADE 1, SPANNING 3.3  CM. TUMOR INVADES LESS THAN 1/2 OF THE MYOMETRIUM. SEE ONCOLOGY TABLE. -SEROSA: UNREMARKABLE. NO MALIGNANCY IDENTIFIED. - CERVIX: BENIGN SQUAMOUS AND ENDOCERVICAL MUCOSA. NO DYSPLASIA OR MALIGNANCY. - BILATERAL OVARIES: INCLUSION CYSTS. NO MALIGNANCY. - BILATERAL FALLOPIAN TUBES: UNREMARKABLE RIGHT TUBE. LEFT TUBE IS NOT IDENTIFIED GROSSLY OR MICROSCOPICALLY.      01/18/2016 Surgery    She underwent robotic-assisted laparoscopic total hysterectomy with bilateral salpingoophorectomy, sentinel lymph node biopsy, lysis of adhesions        HORMONAL RISK FACTORS:  Menarche was at age 70.  First live birth at age 1.  OCP use for approximately <5 years.  Ovaries intact: no.  Hysterectomy: yes.  Menopausal status: postmenopausal.  HRT use: 0 years. Colonoscopy: yes; normal. Mammogram within the last year: yes. Number of breast biopsies: 0. Up to date with pelvic exams:  yes. Any excessive radiation exposure in the past:  no  Past Medical History:  Diagnosis Date  . Arthritis   . Complication of anesthesia 01/10/2016   when [place] feels like lump in throat had endo for bur feeling continues  . Depression   . Diverticulitis   . Endometrial cancer ([place])   . Essential hypertension   . 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 Family history of brain cancer   . Family history of breast cancer   . Family history of colon cancer   . GERD (gastroesophageal reflux disease)   .  H. pylori infection   . Headache    migraines  . Heart palpitations   . History of hiatal hernia   . Hyperlipidemia   . IBS (irritable bowel syndrome)   . OSA (obstructive sleep apnea)    not used  last 3 weeks due to cold cpap setting of 11  . Urinary incontinence     Past Surgical History:  Procedure Laterality Date  . CHOLECYSTECTOMY  2000  . COLECTOMY  2016  . colonscopy    . ROBOTIC ASSISTED TOTAL HYSTERECTOMY WITH BILATERAL SALPINGO OOPHERECTOMY Bilateral  01/18/2016   Procedure: XI ROBOTIC ASSISTED TOTAL HYSTERECTOMY WITH BILATERAL SALPINGO OOPHORECTOMY WITH SENTINAL LYMPH NODE BIOPSY; LYSIS OF ADHESIONS;  Surgeon: Everitt Amber, MD;  Location: [facility];  Service: Gynecology;  Laterality: Bilateral;  . TUBAL LIGATION  1991  . UPPER GI ENDOSCOPY  01/10/2016    Social History   Social History  . Marital status: Married    Spouse name: Richard  . Number of children: 3  . Years of education: N/A   Occupational History  . Non-working, applying disability    Social History Main Topics  . Smoking status: Former Smoker    Packs/day: 1.00    Years: 10.00    Types: Cigarettes    Quit date: 12/05/1988  . Smokeless tobacco: Never Used  . Alcohol use No  . Drug use: No  . Sexual activity: Yes    Partners: Male   Other Topics Concern  . None   Social History Narrative   Taking care of her grandson who has autism, 40 years old     FAMILY HISTORY:  We obtained a detailed, 4-generation family history.  Significant diagnoses are listed below: Family History  Problem Relation Age of Onset  . COPD Mother   . CAD Father   . Cancer Maternal Grandmother 80    breast ca  . Cancer Maternal Aunt     lung ca  . Cancer Maternal Uncle     brain ca  . Pulmonary embolism Sister   . Heart attack Brother   . COPD Brother   . Colon cancer Maternal Uncle   . Brain cancer Cousin     dx under 63    The patient has three sons who are all cancer free.  She has three full siblings - two sisters and a brother, and three maternal half siblings - none who have cancer.  The patient's parents are both deceased.  Her father died of a heart attack.  There is little information on his side of the family as the patient's parents divorced when she was young and she did not have a relationship with him.  Her mother died of COPD.  Her mother had many siblings.  One brother died of a brain tumor, one died of colon cancer, a sister died of lung cancer and another sister  had bone cancer.  The sister with lung cancer had a son who died of a brain tumor under the age of 61.  The patient's maternal grandmother had breast cancer in her 44's.  Allison Spencer is unaware of previous family history of genetic testing for hereditary cancer risks. Patient's maternal ancestors are of [place], [place], [place] and [place] descent, and paternal ancestors are of [place] and [place] descent. There is no reported Ashkenazi Jewish ancestry. There is no known consanguinity.  GENETIC COUNSELING ASSESSMENT: Allison Spencer is a 56 y.o. female with a personal and family history of cancer which is  somewhat suggestive of a hereditary cancer syndrome and predisposition to cancer. We, therefore, discussed and recommended the following at today's visit.   DISCUSSION: We discussed that about 3-5% of uterine cancer is due to hereditary causes, most commonly Lynch syndrome.  Allison Spencer's tumor was tested for Lynch syndrome and was found to have loss of MLH1 and PMS2, but also had hypermethylation. Most commonly the pattern of loss we see in the tumor is due to the hypermethylation and not Lynch syndrome.  However, we can perform genetic testing on the patient and her tumor to help further clarify any genetic risks. We reviewed the characteristics, features and inheritance patterns of hereditary cancer syndromes. We also discussed genetic testing, including the appropriate family members to test, the process of testing, insurance coverage and turn-around-time for results. We discussed the implications of a negative, positive and/or variant of uncertain significant result. We recommended Allison Spencer pursue genetic testing for the TumorNextLynch with CancerNext gene panel. The CancerNext gene panel offered by [employer] includes sequencing and rearrangement analysis for the following 32 genes:   APC, ATM, BARD1, BMPR1A, BRCA1, BRCA2, BRIP1, CDH1, CDK4, CDKN2A, CHEK2, EPCAM, GREM1, MLH1, MRE11A, MSH2,  MSH6, MUTYH, NBN, NF1, PALB2, PMS2, POLD1, POLE, PTEN, RAD50, RAD51D, SMAD4, SMARCA4, STK11, and TP53.    Based on Allison Spencer's personal and family history of cancer, she meets medical criteria for genetic testing. Despite that she meets criteria, she may still have an out of pocket cost. We discussed that if her out of pocket cost for testing is over $100, the laboratory will call and confirm whether she wants to proceed with testing.  If the out of pocket cost of testing is less than $100 she will be billed by the genetic testing laboratory.   PLAN: After considering the risks, benefits, and limitations, Allison Spencer  provided informed consent to pursue genetic testing and the blood sample was sent to [facility] for analysis of the TumorNextLynch and CancerNext. Results should be available within approximately 4-6 weeks' time, at which point they will be disclosed by telephone to Allison Spencer, as will any additional recommendations warranted by these results. Allison Spencer will receive a summary of her genetic counseling visit and a copy of her results once available. This information will also be available in Epic. We encouraged Allison Spencer to remain in contact with cancer genetics annually so that we can continuously update the family history and inform her of any changes in cancer genetics and testing that may be of benefit for her family. Allison Spencer's questions were answered to her satisfaction today. Our contact information was provided should additional questions or concerns arise.  Lastly, we encouraged Allison Spencer to remain in contact with cancer genetics annually so that we can continuously update the family history and inform her of any changes in cancer genetics and testing that may be of benefit for this family.   Ms.  Spencer's questions were answered to her satisfaction today. Our contact information was provided should additional questions or concerns arise. Thank you for the referral and  allowing [place] to share in the care of your patient.   Karen P. Florene Glen, [place], [facility] Certified Genetic Counselor [email or web address] phone: [phone]  The patient was seen for a total of 45 minutes in face-to-face genetic counseling.  This patient was discussed with Drs. Magrinat, Lindi Adie and/or Burr Medico who agrees with the above.    _______________________________________________________________________ For Office Staff:  Number of people involved in session: 2 Was an Psychologist, prison and probation services  involved with case: no

## 2016-02-12 ENCOUNTER — Ambulatory Visit: Payer: Medicaid Other | Admitting: Gynecologic Oncology

## 2016-02-19 ENCOUNTER — Ambulatory Visit: Payer: Medicaid Other | Attending: Gynecologic Oncology | Admitting: Gynecologic Oncology

## 2016-02-19 ENCOUNTER — Encounter: Payer: Self-pay | Admitting: Gynecologic Oncology

## 2016-02-19 VITALS — BP 108/71 | HR 58 | Temp 97.8°F | Resp 18 | Ht 63.0 in | Wt 202.8 lb

## 2016-02-19 DIAGNOSIS — M545 Low back pain: Secondary | ICD-10-CM | POA: Diagnosis not present

## 2016-02-19 DIAGNOSIS — Z888 Allergy status to other drugs, medicaments and biological substances status: Secondary | ICD-10-CM | POA: Insufficient documentation

## 2016-02-19 DIAGNOSIS — Z8542 Personal history of malignant neoplasm of other parts of uterus: Secondary | ICD-10-CM | POA: Insufficient documentation

## 2016-02-19 DIAGNOSIS — C541 Malignant neoplasm of endometrium: Secondary | ICD-10-CM | POA: Insufficient documentation

## 2016-02-19 DIAGNOSIS — Z9049 Acquired absence of other specified parts of digestive tract: Secondary | ICD-10-CM | POA: Diagnosis not present

## 2016-02-19 DIAGNOSIS — Z79899 Other long term (current) drug therapy: Secondary | ICD-10-CM | POA: Diagnosis not present

## 2016-02-19 DIAGNOSIS — Z1509 Genetic susceptibility to other malignant neoplasm: Secondary | ICD-10-CM

## 2016-02-19 DIAGNOSIS — I10 Essential (primary) hypertension: Secondary | ICD-10-CM | POA: Diagnosis not present

## 2016-02-19 DIAGNOSIS — Z9889 Other specified postprocedural states: Secondary | ICD-10-CM | POA: Diagnosis not present

## 2016-02-19 DIAGNOSIS — G4730 Sleep apnea, unspecified: Secondary | ICD-10-CM | POA: Diagnosis not present

## 2016-02-19 DIAGNOSIS — Z808 Family history of malignant neoplasm of other organs or systems: Secondary | ICD-10-CM | POA: Diagnosis not present

## 2016-02-19 DIAGNOSIS — Z825 Family history of asthma and other chronic lower respiratory diseases: Secondary | ICD-10-CM | POA: Insufficient documentation

## 2016-02-19 DIAGNOSIS — Z801 Family history of malignant neoplasm of trachea, bronchus and lung: Secondary | ICD-10-CM | POA: Diagnosis not present

## 2016-02-19 DIAGNOSIS — Z7189 Other specified counseling: Secondary | ICD-10-CM

## 2016-02-19 DIAGNOSIS — Z803 Family history of malignant neoplasm of breast: Secondary | ICD-10-CM | POA: Insufficient documentation

## 2016-02-19 DIAGNOSIS — Z800 Family history of malignant neoplasm of digestive organs: Secondary | ICD-10-CM | POA: Insufficient documentation

## 2016-02-19 DIAGNOSIS — Z87891 Personal history of nicotine dependence: Secondary | ICD-10-CM | POA: Diagnosis not present

## 2016-02-19 DIAGNOSIS — Z8249 Family history of ischemic heart disease and other diseases of the circulatory system: Secondary | ICD-10-CM | POA: Diagnosis not present

## 2016-02-19 DIAGNOSIS — F329 Major depressive disorder, single episode, unspecified: Secondary | ICD-10-CM | POA: Diagnosis not present

## 2016-02-19 DIAGNOSIS — Z90710 Acquired absence of both cervix and uterus: Secondary | ICD-10-CM | POA: Insufficient documentation

## 2016-02-19 NOTE — Patient Instructions (Signed)
Follow up in 1 month   

## 2016-02-19 NOTE — Progress Notes (Signed)
Follow-up Note: Gyn-Onc  Consult was requested by Dr. Alfonse Spruce for the evaluation of Allison Spencer 56 y.o. female  CC:  Chief Complaint  Patient presents with  . Endometrial adenocarinoma    Assessment/Plan:  [name]  is a 56 y.o.  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 Her tumor has high microsatellite instability and loss of nuclear expression of MLH1 and PMS2.   I am recommending systemic adjuvant therapy with 6 cycles of carboplatin and paclitaxel given the metastatic nature of her disease and the high risk for systemic relapse (with poor likelihood of salvage in such cases). Adjuvant chemotherapy in this setting is associated with a 70% 5 year survival rate.  After extensive counseling, she is still reluctant to have chemotherapy at this time due to concerns regarding her "immune system".  However, she is open to reconsidering a little later when she feels stronger.   I will see her back in 1 month. If she is agreeable to chemotherapy at that time, we will schedule her again to see Dr Alvy Bimler. She understands that we would not institute adjuvant therapy after 3 months postop as there is unlikely to be a benefit.  We genetic testing for possible Lynch syndrome given her tumor's high MSI. Additionally, should she require second line therapy in the future, she would be eligible for Pembrolizumab based on this IHC testing of her tumor.  HPI: Allison Spencer is a 56 year old G3P3 who is seen in consultation at the request of Dr Alfonse Spruce for grade 1 endometrial cancer. She was for a routine pap smear by her PCP, Heide Scales, FNP on 07/05/15. This revealed atypical glandular cells - endometrial. She was then seen by Dr Alfonse Spruce on 11/24/15 and reported a single episode of light pink spotting but otherwise no bleeding. 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  The  patient has had a recent history of a laparoscopic sigmoid colectomy for diverticulitis in 2016. She also has a history of a laparoscopic cholecystectomy and tubal ligation.  She has morbid obesity and severe sleep apnea.   Interval Hx:  On 01/18/16 she underwent robotic assisted total hysterectomy, BSO, and SLN biopsy. Surgery was uncomplicated and there were no suspicious findings intraoperatively.  Final pathology revealed a stage IIIC1 endometrioid endometrial adenocarcinoma with a 3.3cm tumor invading <50% (1cm of 2.4cm) with no LVSI. However, bilateral obturator SLN's revealed micrometastases on both H&E and IHC.  Postoperative imaging (including CT abdo/pelvis and chest x ray) revealed no enlarged nodes, residual disease or chest disease.  The patient was recommended to received 6 cycles of adjuvant carboplatin and paclitaxel in accordance with NCCN guidelines. She has met with medical oncology, Dr Heath Lark, but declined adjuvant chemotherapy as she was concerned about immunity.  Since surgery she has been healing well. She reports fatigue and low back pain (she has 3 bulging discs). She denies pain. Her bowels are working normally.  She is taking an "anti-cancer" diet and is losing weight. She is completing antibiotics for H pylori and TB exposure.  Current Meds:  Outpatient Encounter Prescriptions as of 02/19/2016  Medication Sig  . amoxicillin (AMOXIL) 500 MG capsule TAKE 2 (TWO) CAPSULES BY MOUTH TWO TIMES DAILY  . B Complex Vitamins (VITAMIN-B COMPLEX) TABS Take 1 tablet by mouth daily.   . clarithromycin (BIAXIN) 500 MG tablet TAKE 1 (ONE) TABLET BY MOUTH TWO TIMES DAILY  .  dicyclomine (BENTYL) 10 MG capsule Take 10 mg by mouth 2 (two) times daily as needed for spasms.   Marland Kitchen escitalopram (LEXAPRO) 20 MG tablet TAKE ONE AND ONE HALF (1.5) TABLETS BY MOUTH EVERY MORNING  . fluticasone (FLONASE) 50 MCG/ACT nasal spray INSTILL 2 SPRAYS INTO EACH NOSTRIL ONCE A DAY  . HYDROmorphone  (DILAUDID) 2 MG tablet Take 1 tablet (2 mg total) by mouth every 6 (six) hours as needed for severe pain.  Marland Kitchen ibuprofen (ADVIL,MOTRIN) 800 MG tablet TAKE 1 TABLET BY MOUTH 3 TIMES A DAY AS NEEDED FOR PAIN. TAKE WITH FOOD OR MILK  . ketoconazole (NIZORAL) 2 % shampoo APPLY TO WASH TWICE WEEKLY  . losartan (COZAAR) 25 MG tablet Take 25 mg by mouth daily.  . metoprolol tartrate (LOPRESSOR) 25 MG tablet TAKE 1 TABLET BY MOUTH TWICE A DAY  . oxyCODONE-acetaminophen (PERCOCET/ROXICET) 5-325 MG tablet Take 1-2 tablets by mouth every 4 (four) hours as needed for severe pain.  . pantoprazole (PROTONIX) 40 MG tablet Take 40 mg by mouth 2 (two) times daily.   . pravastatin (PRAVACHOL) 20 MG tablet TAKE 1 TABLET BY MOUTH DAILY  . ranitidine (ZANTAC) 300 MG capsule Take 300 mg by mouth 2 (two) times daily as needed for heartburn.   . rifampin (RIFADIN) 300 MG capsule Take 2 capsules (600 mg total) by mouth daily. (Patient taking differently: Take 600 mg by mouth every evening. )  . sucralfate (CARAFATE) 1 GM/10ML suspension Take 1 g by mouth 2 (two) times daily. With meals   No facility-administered encounter medications on file as of 02/19/2016.     Allergy:  Allergies  Allergen Reactions  . Codeine Nausea And Vomiting  . Sulfa Antibiotics Nausea And Vomiting    Social Hx:   Social History   Social History  . Marital status: Married    Spouse name: Richard  . Number of children: 3  . Years of education: N/A   Occupational History  . Non-working, applying disability    Social History Main Topics  . Smoking status: Former Smoker    Packs/day: 1.00    Years: 10.00    Types: Cigarettes    Quit date: 12/05/1988  . Smokeless tobacco: Never Used  . Alcohol use No  . Drug use: No  . Sexual activity: Yes    Partners: Male   Other Topics Concern  . Not on file   Social History Narrative   Taking care of her grandson who has autism, 84 years old    Past Surgical Hx:  Past Surgical  History:  Procedure Laterality Date  . CHOLECYSTECTOMY  2000  . COLECTOMY  2016  . colonscopy    . ROBOTIC ASSISTED TOTAL HYSTERECTOMY WITH BILATERAL SALPINGO OOPHERECTOMY Bilateral 01/18/2016   Procedure: XI ROBOTIC ASSISTED TOTAL HYSTERECTOMY WITH BILATERAL SALPINGO OOPHORECTOMY WITH SENTINAL LYMPH NODE BIOPSY; LYSIS OF ADHESIONS;  Surgeon: Everitt Amber, MD;  Location: [facility];  Service: Gynecology;  Laterality: Bilateral;  . TUBAL LIGATION  1991  . UPPER GI ENDOSCOPY  01/10/2016    Past Medical Hx:  Past Medical History:  Diagnosis Date  . Arthritis   . Complication of anesthesia 01/10/2016   when [place] feels like lump in throat had endo for bur feeling continues  . Depression   . Diverticulitis   . Endometrial cancer ([place])   . Essential hypertension   . 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 Family history of brain  cancer   . Family history of breast cancer   . Family history of colon cancer   . GERD (gastroesophageal reflux disease)   . H. pylori infection   . Headache    migraines  . Heart palpitations   . History of hiatal hernia   . Hyperlipidemia   . IBS (irritable bowel syndrome)   . OSA (obstructive sleep apnea)    not used  last 3 weeks due to cold cpap setting of 11  . Urinary incontinence     Past Gynecological History:  SVD x 3 No LMP recorded. Patient is postmenopausal.  Family Hx:  Family History  Problem Relation Age of Onset  . COPD Mother   . CAD Father   . Cancer Maternal Grandmother 80    breast ca  . Cancer Maternal Aunt     lung ca  . Cancer Maternal Uncle     brain ca  . Pulmonary embolism Sister   . Heart attack Brother   . COPD Brother   . Colon cancer Maternal Uncle   . Brain cancer Cousin     dx under 50    Review of Systems:  Constitutional  Feels well,    ENT Normal appearing ears and nares bilaterally Skin/Breast  No rash, sores, jaundice, itching, dryness Cardiovascular  No chest  pain, shortness of breath, or edema  Pulmonary  No cough or wheeze.  Gastro Intestinal  No nausea, vomitting, or diarrhoea. No bright red blood per rectum, no abdominal pain, change in bowel movement, or constipation.  Genito Urinary  No frequency, urgency, dysuria, no bleeding Musculo Skeletal  No myalgia, arthralgia, joint swelling or pain  Neurologic  No weakness, numbness, change in gait,  Psychology  No depression, anxiety, insomnia.   Vitals:  Blood pressure 108/71, pulse (!) 58, temperature 97.8 F (36.6 C), temperature source Oral, resp. rate 18, height '5\' 3"'$  (1.6 m), weight 202 lb 12.8 oz (92 kg), SpO2 100 %.  Physical Exam: WD in NAD Neck  Supple NROM, without any enlargements.  Lymph Node Survey No cervical supraclavicular or inguinal adenopathy Cardiovascular  Pulse normal rate, regularity and rhythm. S1 and S2 normal.  Lungs  Clear to auscultation bilateraly, without wheezes/crackles/rhonchi. Good air movement.  Skin  No rash/lesions/breakdown  Psychiatry  Alert and oriented to person, place, and time  Abdomen  Normoactive bowel sounds, abdomen soft, non-tender and obese without evidence of hernia.  Back No CVA tenderness Genito Urinary: vaginal cuff healing normally, no blood. No masses.  Rectal  deferred.  Extremities  No bilateral cyanosis, clubbing or edema.   45 minutes of direct face to face counseling time was spent with the patient. This included discussion about prognosis, therapy recommendations and postoperative side effects and are beyond the scope of routine postoperative care.  Donaciano Eva, MD  02/19/2016, 11:59 AM

## 2016-03-26 NOTE — Progress Notes (Deleted)
Follow-up Note: Gyn-Onc  Consult was requested by Dr. Alfonse Spruce for the evaluation of Allison Spencer 56 y.o. female  CC:  No chief complaint on file.   Assessment/Plan:  Allison Spencer  is a 56 y.o.  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 Her tumor has high microsatellite instability and loss of nuclear expression of MLH1 and PMS2.   I am recommending systemic adjuvant therapy with 6 cycles of carboplatin and paclitaxel given the metastatic nature of her disease and the high risk for systemic relapse (with poor likelihood of salvage in such cases). Adjuvant chemotherapy in this setting is associated with a 70% 5 year survival rate.  After extensive counseling, she is still reluctant to have chemotherapy at this time due to concerns regarding her "immune system".  However, she is open to reconsidering a little later when she feels stronger.   I will see her back in 1 month. If she is agreeable to chemotherapy at that time, we will schedule her again to see Dr Alvy Bimler. She understands that we would not institute adjuvant therapy after 3 months postop as there is unlikely to be a benefit.  Genetic testing for possible Lynch syndrome given her tumor's high MSI ***.  HPI: Allison Spencer is a 56 year old G3P3 who is seen in consultation at the request of Dr Alfonse Spruce for grade 1 endometrial cancer. She was for a routine pap smear by her PCP, Heide Scales, FNP on 07/05/15. This revealed atypical glandular cells - endometrial. She was then seen by Dr Alfonse Spruce on 11/24/15 and reported a single episode of light pink spotting but otherwise no bleeding. 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  The patient has had a recent history of a laparoscopic sigmoid colectomy for diverticulitis in 2016. She also has a history of a laparoscopic cholecystectomy and tubal ligation.  She has morbid  obesity and severe sleep apnea.   Interval Hx:  On 01/18/16 she underwent robotic assisted total hysterectomy, BSO, and SLN biopsy. Surgery was uncomplicated and there were no suspicious findings intraoperatively.  Final pathology revealed a stage IIIC1 endometrioid endometrial adenocarcinoma with a 3.3cm tumor invading <50% (1cm of 2.4cm) with no LVSI. However, bilateral obturator SLN's revealed micrometastases on both H&E and IHC.  Postoperative imaging (including CT abdo/pelvis and chest x ray) revealed no enlarged nodes, residual disease or chest disease.  The patient was recommended to received 6 cycles of adjuvant carboplatin and paclitaxel in accordance with NCCN guidelines. She has met with medical oncology, Dr Heath Lark, but declined adjuvant chemotherapy as she was concerned about immunity.  She is taking an "anti-cancer" diet and is losing weight. She is completing antibiotics for H pylori and TB exposure.  Current Meds:  Outpatient Encounter Prescriptions as of 03/27/2016  Medication Sig  . amoxicillin (AMOXIL) 500 MG capsule TAKE 2 (TWO) CAPSULES BY MOUTH TWO TIMES DAILY  . B Complex Vitamins (VITAMIN-B COMPLEX) TABS Take 1 tablet by mouth daily.   . clarithromycin (BIAXIN) 500 MG tablet TAKE 1 (ONE) TABLET BY MOUTH TWO TIMES DAILY  . dicyclomine (BENTYL) 10 MG capsule Take 10 mg by mouth 2 (two) times daily as needed for spasms.   Marland Kitchen escitalopram (LEXAPRO) 20 MG tablet TAKE ONE AND ONE HALF (1.5) TABLETS BY MOUTH EVERY MORNING  . fluticasone (FLONASE) 50 MCG/ACT nasal spray INSTILL 2 SPRAYS INTO EACH NOSTRIL ONCE A DAY  . HYDROmorphone (DILAUDID)  2 MG tablet Take 1 tablet (2 mg total) by mouth every 6 (six) hours as needed for severe pain.  Marland Kitchen ibuprofen (ADVIL,MOTRIN) 800 MG tablet TAKE 1 TABLET BY MOUTH 3 TIMES A DAY AS NEEDED FOR PAIN. TAKE WITH FOOD OR MILK  . ketoconazole (NIZORAL) 2 % shampoo APPLY TO WASH TWICE WEEKLY  . losartan (COZAAR) 25 MG tablet Take 25 mg by mouth  daily.  . metoprolol tartrate (LOPRESSOR) 25 MG tablet TAKE 1 TABLET BY MOUTH TWICE A DAY  . oxyCODONE-acetaminophen (PERCOCET/ROXICET) 5-325 MG tablet Take 1-2 tablets by mouth every 4 (four) hours as needed for severe pain.  . pantoprazole (PROTONIX) 40 MG tablet Take 40 mg by mouth 2 (two) times daily.   . pravastatin (PRAVACHOL) 20 MG tablet TAKE 1 TABLET BY MOUTH DAILY  . ranitidine (ZANTAC) 300 MG capsule Take 300 mg by mouth 2 (two) times daily as needed for heartburn.   . rifampin (RIFADIN) 300 MG capsule Take 2 capsules (600 mg total) by mouth daily. (Patient taking differently: Take 600 mg by mouth every evening. )  . sucralfate (CARAFATE) 1 GM/10ML suspension Take 1 g by mouth 2 (two) times daily. With meals   No facility-administered encounter medications on file as of 03/27/2016.     Allergy:  Allergies  Allergen Reactions  . Codeine Nausea And Vomiting  . Sulfa Antibiotics Nausea And Vomiting    Social Hx:   Social History   Social History  . Marital status: Married    Spouse name: Richard  . Number of children: 3  . Years of education: N/A   Occupational History  . Non-working, applying disability    Social History Main Topics  . Smoking status: Former Smoker    Packs/day: 1.00    Years: 10.00    Types: Cigarettes    Quit date: 12/05/1988  . Smokeless tobacco: Never Used  . Alcohol use No  . Drug use: No  . Sexual activity: Yes    Partners: Male   Other Topics Concern  . Not on file   Social History Narrative   Taking care of her grandson who has autism, 16 years old    Past Surgical Hx:  Past Surgical History:  Procedure Laterality Date  . CHOLECYSTECTOMY  2000  . COLECTOMY  2016  . colonscopy    . ROBOTIC ASSISTED TOTAL HYSTERECTOMY WITH BILATERAL SALPINGO OOPHERECTOMY Bilateral 01/18/2016   Procedure: XI ROBOTIC ASSISTED TOTAL HYSTERECTOMY WITH BILATERAL SALPINGO OOPHORECTOMY WITH SENTINAL LYMPH NODE BIOPSY; LYSIS OF ADHESIONS;  Surgeon: Everitt Amber, MD;  Location: [facility];  Service: Gynecology;  Laterality: Bilateral;  . TUBAL LIGATION  1991  . UPPER GI ENDOSCOPY  01/10/2016    Past Medical Hx:  Past Medical History:  Diagnosis Date  . Arthritis   . Complication of anesthesia 01/10/2016   when [place] feels like lump in throat had endo for bur feeling continues  . Depression   . Diverticulitis   . Endometrial cancer ([place])   . Essential hypertension   . 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 Family history of brain cancer   . Family history of breast cancer   . Family history of colon cancer   . GERD (gastroesophageal reflux disease)   . H. pylori infection   . Headache    migraines  . Heart palpitations   . History of hiatal hernia   . Hyperlipidemia   . IBS (irritable  bowel syndrome)   . OSA (obstructive sleep apnea)    not used  last 3 weeks due to cold cpap setting of 11  . Urinary incontinence     Past Gynecological History:  SVD x 3 No LMP recorded. Patient is postmenopausal.  Family Hx:  Family History  Problem Relation Age of Onset  . COPD Mother   . CAD Father   . Cancer Maternal Grandmother 80    breast ca  . Cancer Maternal Aunt     lung ca  . Cancer Maternal Uncle     brain ca  . Pulmonary embolism Sister   . Heart attack Brother   . COPD Brother   . Colon cancer Maternal Uncle   . Brain cancer Cousin     dx under 50    Review of Systems:  Constitutional  Feels well,    ENT Normal appearing ears and nares bilaterally Skin/Breast  No rash, sores, jaundice, itching, dryness Cardiovascular  No chest pain, shortness of breath, or edema  Pulmonary  No cough or wheeze.  Gastro Intestinal  No nausea, vomitting, or diarrhoea. No bright red blood per rectum, no abdominal pain, change in bowel movement, or constipation.  Genito Urinary  No frequency, urgency, dysuria, no bleeding Musculo Skeletal  No myalgia, arthralgia, joint swelling or pain   Neurologic  No weakness, numbness, change in gait,  Psychology  No depression, anxiety, insomnia.   Vitals:  There were no vitals taken for this visit.  Physical Exam: WD in NAD Neck  Supple NROM, without any enlargements.  Lymph Node Survey No cervical supraclavicular or inguinal adenopathy Cardiovascular  Pulse normal rate, regularity and rhythm. S1 and S2 normal.  Lungs  Clear to auscultation bilateraly, without wheezes/crackles/rhonchi. Good air movement.  Skin  No rash/lesions/breakdown  Psychiatry  Alert and oriented to person, place, and time  Abdomen  Normoactive bowel sounds, abdomen soft, non-tender and obese without evidence of hernia.  Back No CVA tenderness Genito Urinary: vaginal cuff healing normally, no blood. No masses.  Rectal  deferred.  Extremities  No bilateral cyanosis, clubbing or edema.  Donaciano Eva, MD  03/26/2016, 3:01 PM

## 2016-03-27 ENCOUNTER — Ambulatory Visit: Payer: Medicaid Other | Admitting: Gynecologic Oncology

## 2016-04-02 ENCOUNTER — Telehealth: Payer: Self-pay | Admitting: *Deleted

## 2016-04-02 NOTE — Telephone Encounter (Signed)
I have called and spoke with the patient regarding her signature on the hyst form for insurance. She will sin the form at her next appt

## 2016-04-03 ENCOUNTER — Ambulatory Visit: Payer: Self-pay | Admitting: Infectious Diseases

## 2016-04-03 ENCOUNTER — Telehealth: Payer: Self-pay | Admitting: *Deleted

## 2016-04-03 NOTE — Telephone Encounter (Signed)
Patient had an appt today to see Dr. Johnnye Sima and did not come. She called and wanted to know if she in fact needs a follow up now that she has finished her treatment. Please advise

## 2016-04-03 NOTE — Telephone Encounter (Signed)
If she has completed her rifampin and feels well, can f/u with her PCP thanks

## 2016-04-04 NOTE — Telephone Encounter (Signed)
Called patient and she had called back and rescheduled her appointment for 06/06/16. She did finish her medication, but for now she wants to keep this appointment.

## 2016-04-14 NOTE — Progress Notes (Deleted)
Follow-up Note: Gyn-Onc  Consult was requested by Dr. Alfonse Spruce for the evaluation of Allison Spencer 56 y.o. female  CC:  No chief complaint on file.   Assessment/Plan:  Allison Spencer  is a 56 y.o.  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 Her tumor has high microsatellite instability and loss of nuclear expression of MLH1 and PMS2.   I am recommending systemic adjuvant therapy with 6 cycles of carboplatin and paclitaxel given the metastatic nature of her disease and the high risk for systemic relapse (with poor likelihood of salvage in such cases). Adjuvant chemotherapy in this setting is associated with a 70% 5 year survival rate.  After extensive counseling, she is still reluctant to have chemotherapy at this time due to concerns regarding her "immune system".  However, she is open to reconsidering a little later when she feels stronger.   I will see her back in 1 month. If she is agreeable to chemotherapy at that time, we will schedule her again to see Dr Alvy Bimler. She understands that we would not institute adjuvant therapy after 3 months postop as there is unlikely to be a benefit.  Genetic testing for possible Lynch syndrome given her tumor's high MSI ***.  HPI: Allison Spencer is a 56 year old G3P3 who is seen in consultation at the request of Dr Alfonse Spruce for grade 1 endometrial cancer. She was for a routine pap smear by her PCP, Heide Scales, FNP on 07/05/15. This revealed atypical glandular cells - endometrial. She was then seen by Dr Alfonse Spruce on 11/24/15 and reported a single episode of light pink spotting but otherwise no bleeding. 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  The patient has had a recent history of a laparoscopic sigmoid colectomy for diverticulitis in 2016. She also has a history of a laparoscopic cholecystectomy and tubal ligation.  She has morbid  obesity and severe sleep apnea.   Interval Hx:  On 01/18/16 she underwent robotic assisted total hysterectomy, BSO, and SLN biopsy. Surgery was uncomplicated and there were no suspicious findings intraoperatively.  Final pathology revealed a stage IIIC1 endometrioid endometrial adenocarcinoma with a 3.3cm tumor invading <50% (1cm of 2.4cm) with no LVSI. However, bilateral obturator SLN's revealed micrometastases on both H&E and IHC.  Postoperative imaging (including CT abdo/pelvis and chest x ray) revealed no enlarged nodes, residual disease or chest disease.  The patient was recommended to received 6 cycles of adjuvant carboplatin and paclitaxel in accordance with NCCN guidelines. She has met with medical oncology, Dr Heath Lark, but declined adjuvant chemotherapy as she was concerned about immunity.  She is taking an "anti-cancer" diet and is losing weight. She is completing antibiotics for H pylori and TB exposure.  Current Meds:  Outpatient Encounter Prescriptions as of 04/15/2016  Medication Sig  . amoxicillin (AMOXIL) 500 MG capsule TAKE 2 (TWO) CAPSULES BY MOUTH TWO TIMES DAILY  . B Complex Vitamins (VITAMIN-B COMPLEX) TABS Take 1 tablet by mouth daily.   . clarithromycin (BIAXIN) 500 MG tablet TAKE 1 (ONE) TABLET BY MOUTH TWO TIMES DAILY  . dicyclomine (BENTYL) 10 MG capsule Take 10 mg by mouth 2 (two) times daily as needed for spasms.   Marland Kitchen escitalopram (LEXAPRO) 20 MG tablet TAKE ONE AND ONE HALF (1.5) TABLETS BY MOUTH EVERY MORNING  . fluticasone (FLONASE) 50 MCG/ACT nasal spray INSTILL 2 SPRAYS INTO EACH NOSTRIL ONCE A DAY  . HYDROmorphone (DILAUDID)  2 MG tablet Take 1 tablet (2 mg total) by mouth every 6 (six) hours as needed for severe pain.  Marland Kitchen ibuprofen (ADVIL,MOTRIN) 800 MG tablet TAKE 1 TABLET BY MOUTH 3 TIMES A DAY AS NEEDED FOR PAIN. TAKE WITH FOOD OR MILK  . ketoconazole (NIZORAL) 2 % shampoo APPLY TO WASH TWICE WEEKLY  . losartan (COZAAR) 25 MG tablet Take 25 mg by mouth  daily.  . metoprolol tartrate (LOPRESSOR) 25 MG tablet TAKE 1 TABLET BY MOUTH TWICE A DAY  . oxyCODONE-acetaminophen (PERCOCET/ROXICET) 5-325 MG tablet Take 1-2 tablets by mouth every 4 (four) hours as needed for severe pain.  . pantoprazole (PROTONIX) 40 MG tablet Take 40 mg by mouth 2 (two) times daily.   . pravastatin (PRAVACHOL) 20 MG tablet TAKE 1 TABLET BY MOUTH DAILY  . ranitidine (ZANTAC) 300 MG capsule Take 300 mg by mouth 2 (two) times daily as needed for heartburn.   . sucralfate (CARAFATE) 1 GM/10ML suspension Take 1 g by mouth 2 (two) times daily. With meals   No facility-administered encounter medications on file as of 04/15/2016.     Allergy:  Allergies  Allergen Reactions  . Codeine Nausea And Vomiting  . Sulfa Antibiotics Nausea And Vomiting    Social Hx:   Social History   Social History  . Marital status: Married    Spouse name: Richard  . Number of children: 3  . Years of education: N/A   Occupational History  . Non-working, applying disability    Social History Main Topics  . Smoking status: Former Smoker    Packs/day: 1.00    Years: 10.00    Types: Cigarettes    Quit date: 12/05/1988  . Smokeless tobacco: Never Used  . Alcohol use No  . Drug use: No  . Sexual activity: Yes    Partners: Male   Other Topics Concern  . Not on file   Social History Narrative   Taking care of her grandson who has autism, 55 years old    Past Surgical Hx:  Past Surgical History:  Procedure Laterality Date  . CHOLECYSTECTOMY  2000  . COLECTOMY  2016  . colonscopy    . ROBOTIC ASSISTED TOTAL HYSTERECTOMY WITH BILATERAL SALPINGO OOPHERECTOMY Bilateral 01/18/2016   Procedure: XI ROBOTIC ASSISTED TOTAL HYSTERECTOMY WITH BILATERAL SALPINGO OOPHORECTOMY WITH SENTINAL LYMPH NODE BIOPSY; LYSIS OF ADHESIONS;  Surgeon: Adolphus Birchwood, MD;  Location: [facility];  Service: Gynecology;  Laterality: Bilateral;  . TUBAL LIGATION  1991  . UPPER GI ENDOSCOPY  01/10/2016    Past Medical  Hx:  Past Medical History:  Diagnosis Date  . Arthritis   . Complication of anesthesia 01/10/2016   when [place] feels like lump in throat had endo for bur feeling continues  . Depression   . Diverticulitis   . Endometrial cancer (HCC)   . Essential hypertension   . 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 Family history of brain cancer   . Family history of breast cancer   . Family history of colon cancer   . GERD (gastroesophageal reflux disease)   . H. pylori infection   . Headache    migraines  . Heart palpitations   . History of hiatal hernia   . Hyperlipidemia   . IBS (irritable bowel syndrome)   . OSA (obstructive sleep apnea)    not used  last 3 weeks due to cold cpap setting of 11  .  Urinary incontinence     Past Gynecological History:  SVD x 3 No LMP recorded. Patient is postmenopausal.  Family Hx:  Family History  Problem Relation Age of Onset  . COPD Mother   . CAD Father   . Cancer Maternal Grandmother 80    breast ca  . Cancer Maternal Aunt     lung ca  . Cancer Maternal Uncle     brain ca  . Pulmonary embolism Sister   . Heart attack Brother   . COPD Brother   . Colon cancer Maternal Uncle   . Brain cancer Cousin     dx under 50    Review of Systems:  Constitutional  Feels well,    ENT Normal appearing ears and nares bilaterally Skin/Breast  No rash, sores, jaundice, itching, dryness Cardiovascular  No chest pain, shortness of breath, or edema  Pulmonary  No cough or wheeze.  Gastro Intestinal  No nausea, vomitting, or diarrhoea. No bright red blood per rectum, no abdominal pain, change in bowel movement, or constipation.  Genito Urinary  No frequency, urgency, dysuria, no bleeding Musculo Skeletal  No myalgia, arthralgia, joint swelling or pain  Neurologic  No weakness, numbness, change in gait,  Psychology  No depression, anxiety, insomnia.   Vitals:  There were no vitals taken for this  visit.  Physical Exam: WD in NAD Neck  Supple NROM, without any enlargements.  Lymph Node Survey No cervical supraclavicular or inguinal adenopathy Cardiovascular  Pulse normal rate, regularity and rhythm. S1 and S2 normal.  Lungs  Clear to auscultation bilateraly, without wheezes/crackles/rhonchi. Good air movement.  Skin  No rash/lesions/breakdown  Psychiatry  Alert and oriented to person, place, and time  Abdomen  Normoactive bowel sounds, abdomen soft, non-tender and obese without evidence of hernia.  Back No CVA tenderness Genito Urinary: vaginal cuff healing normally, no blood. No masses.  Rectal  deferred.  Extremities  No bilateral cyanosis, clubbing or edema.  Donaciano Eva, MD  04/14/2016, 11:29 PM

## 2016-04-15 ENCOUNTER — Ambulatory Visit: Payer: Medicaid Other | Admitting: Gynecologic Oncology

## 2016-04-15 ENCOUNTER — Telehealth: Payer: Self-pay | Admitting: *Deleted

## 2016-04-15 NOTE — Telephone Encounter (Signed)
Patient called and moved her appt from today to next Monday. Patient also requested genetric results, I transferred her to that department

## 2016-04-22 ENCOUNTER — Ambulatory Visit: Payer: Medicaid Other | Attending: Gynecologic Oncology | Admitting: Gynecologic Oncology

## 2016-04-22 ENCOUNTER — Encounter: Payer: Self-pay | Admitting: Gynecologic Oncology

## 2016-04-22 VITALS — BP 125/74 | HR 63 | Temp 97.6°F | Resp 18 | Wt 199.5 lb

## 2016-04-22 DIAGNOSIS — F329 Major depressive disorder, single episode, unspecified: Secondary | ICD-10-CM | POA: Diagnosis not present

## 2016-04-22 DIAGNOSIS — Z9889 Other specified postprocedural states: Secondary | ICD-10-CM | POA: Insufficient documentation

## 2016-04-22 DIAGNOSIS — Z90722 Acquired absence of ovaries, bilateral: Secondary | ICD-10-CM | POA: Diagnosis not present

## 2016-04-22 DIAGNOSIS — G8929 Other chronic pain: Secondary | ICD-10-CM | POA: Insufficient documentation

## 2016-04-22 DIAGNOSIS — Z9049 Acquired absence of other specified parts of digestive tract: Secondary | ICD-10-CM | POA: Diagnosis not present

## 2016-04-22 DIAGNOSIS — Z79899 Other long term (current) drug therapy: Secondary | ICD-10-CM | POA: Insufficient documentation

## 2016-04-22 DIAGNOSIS — M549 Dorsalgia, unspecified: Secondary | ICD-10-CM | POA: Diagnosis not present

## 2016-04-22 DIAGNOSIS — Z803 Family history of malignant neoplasm of breast: Secondary | ICD-10-CM | POA: Diagnosis not present

## 2016-04-22 DIAGNOSIS — G4733 Obstructive sleep apnea (adult) (pediatric): Secondary | ICD-10-CM | POA: Diagnosis not present

## 2016-04-22 DIAGNOSIS — I10 Essential (primary) hypertension: Secondary | ICD-10-CM | POA: Diagnosis not present

## 2016-04-22 DIAGNOSIS — Z801 Family history of malignant neoplasm of trachea, bronchus and lung: Secondary | ICD-10-CM | POA: Diagnosis not present

## 2016-04-22 DIAGNOSIS — Z87891 Personal history of nicotine dependence: Secondary | ICD-10-CM | POA: Insufficient documentation

## 2016-04-22 DIAGNOSIS — Z888 Allergy status to other drugs, medicaments and biological substances status: Secondary | ICD-10-CM | POA: Insufficient documentation

## 2016-04-22 DIAGNOSIS — Z800 Family history of malignant neoplasm of digestive organs: Secondary | ICD-10-CM | POA: Diagnosis not present

## 2016-04-22 DIAGNOSIS — C541 Malignant neoplasm of endometrium: Secondary | ICD-10-CM | POA: Diagnosis present

## 2016-04-22 DIAGNOSIS — Z808 Family history of malignant neoplasm of other organs or systems: Secondary | ICD-10-CM | POA: Insufficient documentation

## 2016-04-22 DIAGNOSIS — Z8542 Personal history of malignant neoplasm of other parts of uterus: Secondary | ICD-10-CM | POA: Diagnosis not present

## 2016-04-22 DIAGNOSIS — Z825 Family history of asthma and other chronic lower respiratory diseases: Secondary | ICD-10-CM | POA: Diagnosis not present

## 2016-04-22 DIAGNOSIS — Z90710 Acquired absence of both cervix and uterus: Secondary | ICD-10-CM | POA: Diagnosis not present

## 2016-04-22 DIAGNOSIS — Z8249 Family history of ischemic heart disease and other diseases of the circulatory system: Secondary | ICD-10-CM | POA: Insufficient documentation

## 2016-04-22 NOTE — Progress Notes (Signed)
Follow-up Note: Gyn-Onc  Consult was requested by Dr. Alfonse Spruce for the evaluation of Allison Spencer 56 y.o. female  CC:  Chief Complaint  Patient presents with  . Endometrial Cancer    Assessment/Plan:  Allison Spencer  is a 56 y.o.  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 Her tumor has high microsatellite instability and loss of nuclear expression of MLH1 and PMS2 (Lynch syndrome testing pending).  She was recommended adjuvant chemotherapy but declined as she wants to treat her cancer with "cancer fighting foods". I explained that while I respect her decisions, I cannot endorse food therapy as an equivalent strategy with equal likelihood of preventing recurrence. She is interested in treating the cancer if and when it returns, and so we will continue surveillance. I will see her back in 3 months.  HPI: Allison Spencer is a 56 year old G3P3 who is seen in consultation at the request of Dr Alfonse Spruce for grade 1 endometrial cancer. She was for a routine pap smear by her PCP, Heide Scales, FNP on 07/05/15. This revealed atypical glandular cells - endometrial. She was then seen by Dr Alfonse Spruce on 11/24/15 and reported a single episode of light pink spotting but otherwise no bleeding. 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  The patient has had a recent history of a laparoscopic sigmoid colectomy for diverticulitis in 2016. She also has a history of a laparoscopic cholecystectomy and tubal ligation.  She has morbid obesity and severe sleep apnea.   Interval Hx:  On 01/18/16 she underwent robotic assisted total hysterectomy, BSO, and SLN biopsy. Surgery was uncomplicated and there were no suspicious findings intraoperatively.  Final pathology revealed a stage IIIC1 endometrioid endometrial adenocarcinoma with a 3.3cm tumor invading <50% (1cm of 2.4cm) with  no LVSI. However, bilateral obturator SLN's revealed micrometastases on both H&E and IHC.  Postoperative imaging (including CT abdo/pelvis and chest x ray) revealed no enlarged nodes, residual disease or chest disease.  The patient was recommended to received 6 cycles of adjuvant carboplatin and paclitaxel in accordance with NCCN guidelines. She has met with medical oncology, Dr Heath Lark, but declined adjuvant chemotherapy as she was concerned about immunity.  She is taking an "anti-cancer" diet and is losing weight. She is completing antibiotics for H pylori and TB exposure.  She continues to decline adjuvant chemotherapy. She has been tested for Lynch syndrome (her tumor is MSI positive), however, these results are not back.  She has persistent, unchanged chronic back pain.   Current Meds:  Outpatient Encounter Prescriptions as of 04/22/2016  Medication Sig  . amoxicillin (AMOXIL) 500 MG capsule TAKE 2 (TWO) CAPSULES BY MOUTH TWO TIMES DAILY  . B Complex Vitamins (VITAMIN-B COMPLEX) TABS Take 1 tablet by mouth daily.   . clarithromycin (BIAXIN) 500 MG tablet TAKE 1 (ONE) TABLET BY MOUTH TWO TIMES DAILY  . dicyclomine (BENTYL) 10 MG capsule Take 10 mg by mouth 2 (two) times daily as needed for spasms.   Marland Kitchen escitalopram (LEXAPRO) 20 MG tablet TAKE ONE AND ONE HALF (1.5) TABLETS BY MOUTH EVERY MORNING  . fluticasone (FLONASE) 50 MCG/ACT nasal spray INSTILL 2 SPRAYS INTO EACH NOSTRIL ONCE A DAY  . HYDROmorphone (DILAUDID) 2 MG tablet Take 1 tablet (2 mg total) by mouth every 6 (six) hours as needed for severe pain.  Marland Kitchen ibuprofen (ADVIL,MOTRIN) 800 MG tablet TAKE 1 TABLET BY MOUTH  3 TIMES A DAY AS NEEDED FOR PAIN. TAKE WITH FOOD OR MILK  . ketoconazole (NIZORAL) 2 % shampoo APPLY TO WASH TWICE WEEKLY  . losartan (COZAAR) 25 MG tablet Take 25 mg by mouth daily.  . metoprolol tartrate (LOPRESSOR) 25 MG tablet TAKE 1 TABLET BY MOUTH TWICE A DAY  . oxyCODONE-acetaminophen (PERCOCET/ROXICET) 5-325  MG tablet Take 1-2 tablets by mouth every 4 (four) hours as needed for severe pain.  . pantoprazole (PROTONIX) 40 MG tablet Take 40 mg by mouth 2 (two) times daily.   . pravastatin (PRAVACHOL) 20 MG tablet TAKE 1 TABLET BY MOUTH DAILY  . ranitidine (ZANTAC) 300 MG capsule Take 300 mg by mouth 2 (two) times daily as needed for heartburn.   . sucralfate (CARAFATE) 1 GM/10ML suspension Take 1 g by mouth 2 (two) times daily. With meals   No facility-administered encounter medications on file as of 04/22/2016.     Allergy:  Allergies  Allergen Reactions  . Codeine Nausea And Vomiting  . Sulfa Antibiotics Nausea And Vomiting    Social Hx:   Social History   Social History  . Marital status: Married    Spouse name: Richard  . Number of children: 3  . Years of education: N/A   Occupational History  . Non-working, applying disability    Social History Main Topics  . Smoking status: Former Smoker    Packs/day: 1.00    Years: 10.00    Types: Cigarettes    Quit date: 12/05/1988  . Smokeless tobacco: Never Used  . Alcohol use No  . Drug use: No  . Sexual activity: Yes    Partners: Male   Other Topics Concern  . Not on file   Social History Narrative   Taking care of her grandson who has autism, 94 years old    Past Surgical Hx:  Past Surgical History:  Procedure Laterality Date  . CHOLECYSTECTOMY  2000  . COLECTOMY  2016  . colonscopy    . ROBOTIC ASSISTED TOTAL HYSTERECTOMY WITH BILATERAL SALPINGO OOPHERECTOMY Bilateral 01/18/2016   Procedure: XI ROBOTIC ASSISTED TOTAL HYSTERECTOMY WITH BILATERAL SALPINGO OOPHORECTOMY WITH SENTINAL LYMPH NODE BIOPSY; LYSIS OF ADHESIONS;  Surgeon: Everitt Amber, MD;  Location: [facility];  Service: Gynecology;  Laterality: Bilateral;  . TUBAL LIGATION  1991  . UPPER GI ENDOSCOPY  01/10/2016    Past Medical Hx:  Past Medical History:  Diagnosis Date  . Arthritis   . Complication of anesthesia 01/10/2016   when [place] feels like lump in  throat had endo for bur feeling continues  . Depression   . Diverticulitis   . Endometrial cancer ([place])   . Essential hypertension   . 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 Family history of brain cancer   . Family history of breast cancer   . Family history of colon cancer   . GERD (gastroesophageal reflux disease)   . H. pylori infection   . Headache    migraines  . Heart palpitations   . History of hiatal hernia   . Hyperlipidemia   . IBS (irritable bowel syndrome)   . OSA (obstructive sleep apnea)    not used  last 3 weeks due to cold cpap setting of 11  . Urinary incontinence     Past Gynecological History:  SVD x 3 No LMP recorded. Patient is postmenopausal.  Family Hx:  Family History  Problem Relation Age of Onset  .  COPD Mother   . CAD Father   . Cancer Maternal Grandmother 80    breast ca  . Cancer Maternal Aunt     lung ca  . Cancer Maternal Uncle     brain ca  . Pulmonary embolism Sister   . Heart attack Brother   . COPD Brother   . Colon cancer Maternal Uncle   . Brain cancer Cousin     dx under 50    Review of Systems:  Constitutional  Feels well,    ENT Normal appearing ears and nares bilaterally Skin/Breast  No rash, sores, jaundice, itching, dryness Cardiovascular  No chest pain, shortness of breath, or edema  Pulmonary  No cough or wheeze.  Gastro Intestinal  No nausea, vomitting, or diarrhoea. No bright red blood per rectum, no abdominal pain, change in bowel movement, or constipation.  Genito Urinary  No frequency, urgency, dysuria, no bleeding Musculo Skeletal  No myalgia, arthralgia, joint swelling or pain  Neurologic  No weakness, numbness, change in gait,  Psychology  No depression, anxiety, insomnia.   Vitals:  Blood pressure 125/74, pulse 63, temperature 97.6 F (36.4 C), temperature source Oral, resp. rate 18, weight 199 lb 8 oz (90.5 kg).  Physical Exam: WD in NAD Neck  Supple  NROM, without any enlargements.  Lymph Node Survey No cervical supraclavicular or inguinal adenopathy Cardiovascular  Pulse normal rate, regularity and rhythm. S1 and S2 normal.  Lungs  Clear to auscultation bilateraly, without wheezes/crackles/rhonchi. Good air movement.  Skin  No rash/lesions/breakdown  Psychiatry  Alert and oriented to person, place, and time  Abdomen  Normoactive bowel sounds, abdomen soft, non-tender and obese without evidence of hernia.  Back No CVA tenderness Genito Urinary: vaginal cuff healed, no blood. No masses.  Rectal  deferred.  Extremities  No bilateral cyanosis, clubbing or edema.  Donaciano Eva, MD  04/22/2016, 10:11 AM

## 2016-04-22 NOTE — Patient Instructions (Signed)
Please give our office a call in 1-2 weeks to schedule a followup appointment in July 2018 with Dr Denman George [phone])  Please notify our office if you develop symptoms such as  1. new and different back pain 2. New swelling in one or both legs 3. Vaginal bleeding 4. New abdominal pains 5. A persistent cough in the absence of illness. 6. Any other new and concerning symptom

## 2016-04-25 ENCOUNTER — Telehealth: Payer: Self-pay | Admitting: *Deleted

## 2016-04-25 NOTE — Telephone Encounter (Signed)
I have called [name] and gave her the patient's address for the hyst. form

## 2016-05-01 ENCOUNTER — Telehealth: Payer: Self-pay | Admitting: Genetic Counselor

## 2016-05-01 ENCOUNTER — Ambulatory Visit: Payer: Self-pay | Admitting: Genetic Counselor

## 2016-05-01 ENCOUNTER — Encounter: Payer: Self-pay | Admitting: Genetic Counselor

## 2016-05-01 DIAGNOSIS — Z808 Family history of malignant neoplasm of other organs or systems: Secondary | ICD-10-CM

## 2016-05-01 DIAGNOSIS — Z1379 Encounter for other screening for genetic and chromosomal anomalies: Secondary | ICD-10-CM

## 2016-05-01 DIAGNOSIS — Z800 Family history of malignant neoplasm of digestive organs: Secondary | ICD-10-CM

## 2016-05-01 DIAGNOSIS — C541 Malignant neoplasm of endometrium: Secondary | ICD-10-CM

## 2016-05-01 DIAGNOSIS — Z803 Family history of malignant neoplasm of breast: Secondary | ICD-10-CM

## 2016-05-01 HISTORY — DX: Encounter for other screening for genetic and chromosomal anomalies: Z13.79

## 2016-05-01 NOTE — Telephone Encounter (Signed)
Revealed negative genetic testing through the germline and tumor testing.  This testing indicates that we do not feel that her uterine cancer was due to a hereditary mutation.  It does not tell us why she has cancer or why there is cancer in the family.  There could be another gene that we are not testing for, or a mutation that we are not able to pick up at this time.  This testing does not indicate that we need to follow her differently.

## 2016-05-01 NOTE — Progress Notes (Signed)
HPI: Ms. Knepp was previously seen in the [facility] due to a personal history of uterine cancer and family history of cancer and concerns regarding a hereditary predisposition to cancer. Please refer to our prior cancer genetics clinic note for more information regarding Ms. Kepple's medical, social and family histories, and our assessment and recommendations, at the time. Ms. Wiers's recent genetic test results were disclosed to her, as were recommendations warranted by these results. These results and recommendations are discussed in more detail below.  CANCER HISTORY:    Endometrial cancer ([place])   07/05/2015 Pathology Results    She had abnormal PAP      11/24/2015 Procedure    She underwent endometrial sampling that came back abnormal      01/10/2016 Procedure    She had EGD which showed esophagitis      01/18/2016 Pathology Results    Diagnosis 1. Lymph node, sentinel, biopsy, right obturator - MICROMETASTASIS IN ONE OF ONE LYMPH NODES (1/1). 2. Lymph node, sentinel, biopsy, left external - ONE OF ONE LYMPH NODES NEGATIVE FOR CARCINOMA (0/1). 3. Lymph node, sentinel, biopsy, left obturator - MICROMETASTASIS IN ONE OF ONE LYMPH NODES (1/1). 4. Uterus +/- tubes/ovaries, neoplastic - UTERUS: -ENDOMYOMETRIUM: ENDOMETRIOID ADENOCARCINOMA, FIGO GRADE 1, SPANNING 3.3 CM. TUMOR INVADES LESS THAN 1/2 OF THE MYOMETRIUM. SEE ONCOLOGY TABLE. -SEROSA: UNREMARKABLE. NO MALIGNANCY IDENTIFIED. - CERVIX: BENIGN SQUAMOUS AND ENDOCERVICAL MUCOSA. NO DYSPLASIA OR MALIGNANCY. - BILATERAL OVARIES: INCLUSION CYSTS. NO MALIGNANCY. - BILATERAL FALLOPIAN TUBES: UNREMARKABLE RIGHT TUBE. LEFT TUBE IS NOT IDENTIFIED GROSSLY OR MICROSCOPICALLY.      01/18/2016 Surgery    She underwent robotic-assisted laparoscopic total hysterectomy with bilateral salpingoophorectomy, sentinel lymph node biopsy, lysis of adhesions      04/24/2016 Genetic Testing    Negative genetic testing on  the TumorNext Lynch with CancerNext.  This is paried germline and tumor analyses for enhanced diagnosis of lynch syndrome plus analyses of 29 additional genes associated with hereditary cancer.  The CancerNext gene panel offered by [employer] includes sequencing and rearrangement analysis for the following 34 genes:   APC, ATM, BARD1, BMPR1A, BRCA1, BRCA2, BRIP1, CDH1, CDK4, CDKN2A, CHEK2, DICER, HOXB13, EPCAM, GREM1, MLH1, MRE11A, MSH2, MSH6, MUTYH, NBN, NF1, PALB2, PMS2, POLD1, POLE, PTEN, RAD50, RAD51C, RAD51D, SMAD4, SMARCA4, STK11, and TP53.   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  Taken with the germline results demonstrating absence of pathogenic mutations or likely pathogenic variants (VLPs) in the mismatch repair (MMR) genes, the likelihood that this individual has Lynch syndrome/HNPCC is greatly decreased; however, the possibility of an undetected variant of either germline or somatic origin due to mosaicism and other rare etiologies cannot be ruled out by the current methodology.  Correlation with clinical history and external tumor testing results is advised.        FAMILY HISTORY:  We obtained a detailed, 4-generation family history.  Significant diagnoses are listed below: Family History  Problem Relation Age of Onset  . COPD Mother   . CAD Father   . Cancer Maternal Grandmother 80    breast ca  . Cancer Maternal Aunt     lung ca  . Cancer Maternal Uncle     brain ca  . Pulmonary embolism Sister   . Heart attack Brother   . COPD Brother   . Colon cancer Maternal Uncle   . Brain cancer Cousin     dx under 50    The patient  has three sons who are all cancer free.  She has three full siblings - two sisters and a brother, and three maternal half siblings - none who have cancer.  The patient's parents are both deceased.  Her father died of a heart attack.  There is little information on his side of the family as the  patient's parents divorced when she was young and she did not have a relationship with him.  Her mother died of COPD.  Her mother had many siblings.  One brother died of a brain tumor, one died of colon cancer, a sister died of lung cancer and another sister had bone cancer.  The sister with lung cancer had a son who died of a brain tumor under the age of 35.  The patient's maternal grandmother had breast cancer in her 61's.  Ms. Bors is unaware of previous family history of genetic testing for hereditary cancer risks. Patient's maternal ancestors are of [place], [place], [place] and [place] descent, and paternal ancestors are of [place] and [place] descent. There is no reported Ashkenazi Jewish ancestry. There is no known consanguinity.  GENETIC TEST RESULTS: Genetic testing reported out on April 24, 2016 through the TumorNext Lynch with CancerNext cancer panel found no deleterious mutations.  TumorNext Lynch with CancerNext is a paired germline and tumor analyses for enhanced diagnosis of lynch syndrome plus analyses of 29 additional genes associated with hereditary cancer.  The CancerNext gene panel offered by [employer] includes sequencing and rearrangement analysis for the following 34 genes:   APC, ATM, BARD1, BMPR1A, BRCA1, BRCA2, BRIP1, CDH1, CDK4, CDKN2A, CHEK2, DICER, HOXB13, EPCAM, GREM1, MLH1, MRE11A, MSH2, MSH6, MUTYH, NBN, NF1, PALB2, PMS2, POLD1, POLE, PTEN, RAD50, RAD51C, RAD51D, SMAD4, SMARCA4, STK11, and TP53.  The report date is April 24, 2016.  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  Taken with the germline results demonstrating absence of pathogenic mutations or likely pathogenic variants (VLPs) in the mismatch repair (MMR) genes, the likelihood that this individual has Lynch syndrome/HNPCC is greatly decreased; however, the possibility of an undetected variant of either germline or somatic origin due to mosaicism and  other rare etiologies cannot be ruled out by the current methodology.  Correlation with clinical history and external tumor testing results is advised.  The test report has been scanned into EPIC and is located under the Molecular Pathology section of the Results Review tab.   We discussed with Ms. Mccammon that since the current genetic testing is not perfect, it is possible there may be a gene mutation in one of these genes that current testing cannot detect, but that chance is small. We also discussed, that it is possible that another gene that has not yet been discovered, or that we have not yet tested, is responsible for the cancer diagnoses in the family, and it is, therefore, important to remain in touch with cancer genetics in the future so that we can continue to offer Ms. Wagoner the most up to date genetic testing.   CANCER SCREENING RECOMMENDATIONS:  This result is reassuring and indicates that Ms. Cura likely does not have an increased risk for a future cancer due to a mutation in one of these genes. This normal test also suggests that Ms. Ades's cancer was most likely not due to an inherited predisposition associated with one of these genes.  Most cancers happen by chance and this negative test suggests that her cancer falls into this category.  We,  therefore, recommended she continue to follow the cancer management and screening guidelines provided by her oncology and primary healthcare provider.   RECOMMENDATIONS FOR FAMILY MEMBERS: Women in this family might be at some increased risk of developing cancer, over the general population risk, simply due to the family history of cancer. We recommended women in this family have a yearly mammogram beginning at age 42, or 42 years younger than the earliest onset of cancer, an annual clinical breast exam, and perform monthly breast self-exams. Women in this family should also have a gynecological exam as recommended by their primary provider. All  family members should have a colonoscopy by age 45.  FOLLOW-UP: Lastly, we discussed with Ms. Charo that cancer genetics is a rapidly advancing field and it is possible that new genetic tests will be appropriate for her and/or her family members in the future. We encouraged her to remain in contact with cancer genetics on an annual basis so we can update her personal and family histories and let her know of advances in cancer genetics that may benefit this family.   Our contact number was provided. Ms. Susi's questions were answered to her satisfaction, and she knows she is welcome to call us at anytime with additional questions or concerns.   Roma Kayser, MS, [facility] Certified Genetic Counselor [email or web address]

## 2016-06-05 ENCOUNTER — Ambulatory Visit: Payer: Medicaid Other | Admitting: Infectious Diseases

## 2016-06-06 ENCOUNTER — Ambulatory Visit: Payer: Medicaid Other | Admitting: Infectious Diseases

## 2016-07-12 ENCOUNTER — Telehealth: Payer: Self-pay | Admitting: *Deleted

## 2016-07-16 NOTE — Telephone Encounter (Signed)
Open by mistake

## 2016-07-22 ENCOUNTER — Ambulatory Visit: Payer: Medicaid Other | Attending: Gynecologic Oncology | Admitting: Gynecologic Oncology

## 2016-07-22 ENCOUNTER — Encounter: Payer: Self-pay | Admitting: Gynecologic Oncology

## 2016-07-22 VITALS — BP 127/86 | HR 80 | Temp 97.8°F | Resp 20

## 2016-07-22 DIAGNOSIS — Z803 Family history of malignant neoplasm of breast: Secondary | ICD-10-CM | POA: Insufficient documentation

## 2016-07-22 DIAGNOSIS — Z825 Family history of asthma and other chronic lower respiratory diseases: Secondary | ICD-10-CM | POA: Insufficient documentation

## 2016-07-22 DIAGNOSIS — Z801 Family history of malignant neoplasm of trachea, bronchus and lung: Secondary | ICD-10-CM | POA: Diagnosis not present

## 2016-07-22 DIAGNOSIS — I10 Essential (primary) hypertension: Secondary | ICD-10-CM | POA: Insufficient documentation

## 2016-07-22 DIAGNOSIS — G8929 Other chronic pain: Secondary | ICD-10-CM

## 2016-07-22 DIAGNOSIS — F329 Major depressive disorder, single episode, unspecified: Secondary | ICD-10-CM | POA: Diagnosis not present

## 2016-07-22 DIAGNOSIS — Z808 Family history of malignant neoplasm of other organs or systems: Secondary | ICD-10-CM | POA: Diagnosis not present

## 2016-07-22 DIAGNOSIS — Z9889 Other specified postprocedural states: Secondary | ICD-10-CM | POA: Diagnosis not present

## 2016-07-22 DIAGNOSIS — Z888 Allergy status to other drugs, medicaments and biological substances status: Secondary | ICD-10-CM | POA: Diagnosis not present

## 2016-07-22 DIAGNOSIS — R05 Cough: Secondary | ICD-10-CM | POA: Insufficient documentation

## 2016-07-22 DIAGNOSIS — Z90710 Acquired absence of both cervix and uterus: Secondary | ICD-10-CM | POA: Diagnosis not present

## 2016-07-22 DIAGNOSIS — M5442 Lumbago with sciatica, left side: Secondary | ICD-10-CM

## 2016-07-22 DIAGNOSIS — Z8249 Family history of ischemic heart disease and other diseases of the circulatory system: Secondary | ICD-10-CM | POA: Insufficient documentation

## 2016-07-22 DIAGNOSIS — R059 Cough, unspecified: Secondary | ICD-10-CM

## 2016-07-22 DIAGNOSIS — Z9049 Acquired absence of other specified parts of digestive tract: Secondary | ICD-10-CM | POA: Insufficient documentation

## 2016-07-22 DIAGNOSIS — Z87891 Personal history of nicotine dependence: Secondary | ICD-10-CM | POA: Diagnosis not present

## 2016-07-22 DIAGNOSIS — Z79899 Other long term (current) drug therapy: Secondary | ICD-10-CM | POA: Diagnosis not present

## 2016-07-22 DIAGNOSIS — M549 Dorsalgia, unspecified: Secondary | ICD-10-CM | POA: Insufficient documentation

## 2016-07-22 DIAGNOSIS — Z800 Family history of malignant neoplasm of digestive organs: Secondary | ICD-10-CM | POA: Insufficient documentation

## 2016-07-22 DIAGNOSIS — C541 Malignant neoplasm of endometrium: Secondary | ICD-10-CM | POA: Insufficient documentation

## 2016-07-22 NOTE — Patient Instructions (Addendum)
Plan to have a CT scan of the chest, abdomen and pelvis.  If no intervention needed based on results, plan to follow up in three months or sooner if needed.  You will receive a phone call about your scan at [facility].  We will also call you with the results.

## 2016-07-22 NOTE — Progress Notes (Signed)
Follow-up Note: Gyn-Onc  Consult was requested by Dr. Cyndie Chime for the evaluation of Allison Spencer 56 y.o. female  CC:  Chief Complaint  Patient presents with  . endometrial cancer follow-up    Assessment/Plan:  Ms. NATALI LAVALLEE  is a 56 y.o.  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   She was recommended adjuvant chemotherapy but declined as she wants to treat her cancer with "cancer fighting foods".   1/ back pain - likely secondary to degeneration and obesity. Given that it has increased since surgery, will order Ct abdo/pelvis to rule out apparent occult recurrence as cause. If negative for recurrence, recommend follow-up with her PCP to discuss referral to back specialist and possible MRI testing. 2/ cough - evaluate with CT chest to ensure no apparent recurrence.  She is interested in treating the cancer if and when it returns, and so we will continue surveillance. I will see her back in 3 months.  HPI: Allison Spencer is a 56 year old G3P3 who is seen in consultation at the request of Dr Cyndie Chime for grade 1 endometrial cancer. She was for a routine pap smear by her PCP, Mauricio Po, FNP on 07/05/15. This revealed atypical glandular cells - endometrial. She was then seen by Dr Cyndie Chime on 11/24/15 and reported a single episode of light pink spotting but otherwise no bleeding. On 11/24/15 Dr Cyndie Chime performed an endometrial biopsy which revealed well differentiated (FIGO grade 1) endometrioid endometrial cancer.  The patient has had a recent history of a laparoscopic sigmoid colectomy for diverticulitis in 2016. She also has a history of a laparoscopic cholecystectomy and tubal ligation.  She has morbid obesity and severe sleep apnea.   On 01/18/16 she underwent  robotic assisted total hysterectomy, BSO, and SLN biopsy. Surgery was uncomplicated and there were no suspicious findings intraoperatively.  Final pathology revealed a stage IIIC1 endometrioid endometrial adenocarcinoma with a 3.3cm tumor invading <50% (1cm of 2.4cm) with no LVSI. However, bilateral obturator SLN's revealed micrometastases on both H&E and IHC.  Postoperative imaging (including CT abdo/pelvis and chest x ray) revealed no enlarged nodes, residual disease or chest disease.  The patient was recommended to received 6 cycles of adjuvant carboplatin and paclitaxel in accordance with NCCN guidelines. She has met with medical oncology, Dr Artis Delay, but declined adjuvant chemotherapy as she was concerned about immunity.  She elected for an "anti-cancer" diet and is losing weight. She is completing antibiotics for H pylori and TB exposure.  She continues to decline adjuvant chemotherapy. She has been tested for Lynch syndrome (her tumor is MSI positive with loss of MLH1 expression), however, Lynch syndrome testing was negative.  Interval Hx:  Her back pain has been worse since surgery. + radicular pain down left leg. Post surgical CT imaging did not show any apparent disease.  She has a new cough, worse at night. No SOB or chest pain. No fever or sputum.   Current Meds:  Outpatient Encounter Prescriptions as of 07/22/2016  Medication Sig  . amoxicillin (AMOXIL) 500 MG capsule TAKE 2 (TWO) CAPSULES BY MOUTH TWO TIMES DAILY  . B Complex Vitamins (VITAMIN-B COMPLEX) TABS Take 1 tablet by mouth daily.   . clarithromycin (BIAXIN) 500 MG tablet TAKE 1 (ONE) TABLET BY MOUTH TWO TIMES DAILY  . dicyclomine (BENTYL)  10 MG capsule Take 10 mg by mouth 2 (two) times daily as needed for spasms.   Marland Kitchen escitalopram (LEXAPRO) 20 MG tablet TAKE ONE AND ONE HALF (1.5) TABLETS BY MOUTH EVERY MORNING  . fluticasone (FLONASE) 50 MCG/ACT nasal spray INSTILL 2 SPRAYS INTO EACH NOSTRIL ONCE A DAY  .  HYDROmorphone (DILAUDID) 2 MG tablet Take 1 tablet (2 mg total) by mouth every 6 (six) hours as needed for severe pain.  Marland Kitchen ibuprofen (ADVIL,MOTRIN) 800 MG tablet TAKE 1 TABLET BY MOUTH 3 TIMES A DAY AS NEEDED FOR PAIN. TAKE WITH FOOD OR MILK  . ketoconazole (NIZORAL) 2 % shampoo APPLY TO WASH TWICE WEEKLY  . losartan (COZAAR) 25 MG tablet Take 25 mg by mouth daily.  . metoprolol tartrate (LOPRESSOR) 25 MG tablet TAKE 1 TABLET BY MOUTH TWICE A DAY  . oxyCODONE-acetaminophen (PERCOCET/ROXICET) 5-325 MG tablet Take 1-2 tablets by mouth every 4 (four) hours as needed for severe pain.  . pantoprazole (PROTONIX) 40 MG tablet Take 40 mg by mouth 2 (two) times daily.   . pravastatin (PRAVACHOL) 20 MG tablet TAKE 1 TABLET BY MOUTH DAILY  . ranitidine (ZANTAC) 300 MG capsule Take 300 mg by mouth 2 (two) times daily as needed for heartburn.   . sucralfate (CARAFATE) 1 GM/10ML suspension Take 1 g by mouth 2 (two) times daily. With meals   No facility-administered encounter medications on file as of 07/22/2016.     Allergy:  Allergies  Allergen Reactions  . Codeine Nausea And Vomiting  . Sulfa Antibiotics Nausea And Vomiting    Social Hx:   Social History   Social History  . Marital status: Married    Spouse name: Richard  . Number of children: 3  . Years of education: N/A   Occupational History  . Non-working, applying disability    Social History Main Topics  . Smoking status: Former Smoker    Packs/day: 1.00    Years: 10.00    Types: Cigarettes    Quit date: 12/05/1988  . Smokeless tobacco: Never Used  . Alcohol use No  . Drug use: No  . Sexual activity: Yes    Partners: Male   Other Topics Concern  . Not on file   Social History Narrative   Taking care of her grandson who has autism, 25 years old    Past Surgical Hx:  Past Surgical History:  Procedure Laterality Date  . CHOLECYSTECTOMY  2000  . COLECTOMY  2016  . colonscopy    . ROBOTIC ASSISTED TOTAL HYSTERECTOMY WITH  BILATERAL SALPINGO OOPHERECTOMY Bilateral 01/18/2016   Procedure: XI ROBOTIC ASSISTED TOTAL HYSTERECTOMY WITH BILATERAL SALPINGO OOPHORECTOMY WITH SENTINAL LYMPH NODE BIOPSY; LYSIS OF ADHESIONS;  Surgeon: Everitt Amber, MD;  Location: [facility];  Service: Gynecology;  Laterality: Bilateral;  . TUBAL LIGATION  1991  . UPPER GI ENDOSCOPY  01/10/2016    Past Medical Hx:  Past Medical History:  Diagnosis Date  . Arthritis   . Complication of anesthesia 01/10/2016   when [place] feels like lump in throat had endo for bur feeling continues  . Depression   . Diverticulitis   . Endometrial cancer ([place])   . Essential hypertension   . 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 Family history of brain cancer   . Family history of breast cancer   . Family history of colon cancer   . GERD (gastroesophageal reflux disease)   . H. pylori  infection   . Headache    migraines  . Heart palpitations   . History of hiatal hernia   . Hyperlipidemia   . IBS (irritable bowel syndrome)   . OSA (obstructive sleep apnea)    not used  last 3 weeks due to cold cpap setting of 11  . Urinary incontinence     Past Gynecological History:  SVD x 3 No LMP recorded. Patient is postmenopausal.  Family Hx:  Family History  Problem Relation Age of Onset  . COPD Mother   . CAD Father   . Cancer Maternal Grandmother 80       breast ca  . Cancer Maternal Aunt        lung ca  . Cancer Maternal Uncle        brain ca  . Pulmonary embolism Sister   . Heart attack Brother   . COPD Brother   . Colon cancer Maternal Uncle   . Brain cancer Cousin        dx under 50    Review of Systems:  Constitutional  Feels well,    ENT Normal appearing ears and nares bilaterally Skin/Breast  No rash, sores, jaundice, itching, dryness Cardiovascular  No chest pain, shortness of breath, or edema  Pulmonary  + cough no wheeze.  Gastro Intestinal  No nausea, vomitting, or diarrhoea. No  bright red blood per rectum, no abdominal pain, change in bowel movement, or constipation.  Genito Urinary  No frequency, urgency, dysuria, no bleeding Musculo Skeletal  + back pain (left lumbar) Neurologic  + radicular pain down left leg Psychology  No depression, anxiety, insomnia.   Vitals:  Blood pressure 127/86, pulse 80, temperature 97.8 F (36.6 C), temperature source Oral, resp. rate 20, SpO2 99 %.  Physical Exam: WD in NAD Neck  Supple NROM, without any enlargements.  Lymph Node Survey No cervical supraclavicular or inguinal adenopathy Cardiovascular  Pulse normal rate, regularity and rhythm. S1 and S2 normal.  Lungs  Clear to auscultation bilateraly, without wheezes/crackles/rhonchi. Good air movement.  Skin  No rash/lesions/breakdown  Psychiatry  Alert and oriented to person, place, and time  Abdomen  Normoactive bowel sounds, abdomen soft, non-tender and obese without evidence of hernia.  Back No CVA tenderness Genito Urinary: vaginal cuff healed, no blood. No masses.  Rectal  deferred.  Extremities  No bilateral cyanosis, clubbing or edema.  Donaciano Eva, MD  07/22/2016, 3:52 PM

## 2016-07-23 ENCOUNTER — Telehealth: Payer: Self-pay | Admitting: *Deleted

## 2016-07-23 NOTE — Telephone Encounter (Signed)
Attempted to contact the patient regarding her CT scans. Patient now listed has a self pay, waiting for insurance. Need to see if she wants to wait for insurance first before having the scans.  Left message for her to call the office back.

## 2016-08-01 ENCOUNTER — Telehealth: Payer: Self-pay | Admitting: Gynecologic Oncology

## 2016-08-01 NOTE — Telephone Encounter (Signed)
Patient informed of CT scan results.  No concerns voiced.  Advised to call for any needs or concerns.

## 2016-10-17 ENCOUNTER — Telehealth: Payer: Self-pay | Admitting: *Deleted

## 2016-10-17 NOTE — Telephone Encounter (Signed)
Returned patient call, patient needed to cancel her appt for tomorrow. Appt for tomorrow was canceled. LMOM for the patient that we will call her tomorrow to reschedule.

## 2016-10-18 ENCOUNTER — Telehealth: Payer: Self-pay | Admitting: *Deleted

## 2016-10-18 ENCOUNTER — Ambulatory Visit: Payer: Self-pay | Admitting: Gynecologic Oncology

## 2016-10-18 NOTE — Telephone Encounter (Signed)
Unable to reach patient at [phone]. Contacted the patient on the mobile number and LMOM to call the office back.

## 2016-12-11 ENCOUNTER — Telehealth: Payer: Self-pay

## 2016-12-11 NOTE — Telephone Encounter (Signed)
LM for Allison Spencer to call back to schedule an appointment as she called to set up one up for follow up. Pt cancelled her October 12 h appointment due to power outage.

## 2016-12-12 NOTE — Telephone Encounter (Signed)
Pt called and scheduled a follow up appointment for 02-03-17 with Dr. Denman George.

## 2017-02-03 ENCOUNTER — Inpatient Hospital Stay: Payer: Medicare Other | Attending: Gynecologic Oncology | Admitting: Gynecologic Oncology

## 2017-02-03 ENCOUNTER — Encounter: Payer: Self-pay | Admitting: Gynecologic Oncology

## 2017-02-03 VITALS — BP 128/87 | HR 77 | Temp 97.4°F | Resp 18 | Ht 63.0 in | Wt 195.3 lb

## 2017-02-03 DIAGNOSIS — Z90722 Acquired absence of ovaries, bilateral: Secondary | ICD-10-CM | POA: Insufficient documentation

## 2017-02-03 DIAGNOSIS — Z79899 Other long term (current) drug therapy: Secondary | ICD-10-CM

## 2017-02-03 DIAGNOSIS — Z90710 Acquired absence of both cervix and uterus: Secondary | ICD-10-CM | POA: Diagnosis not present

## 2017-02-03 DIAGNOSIS — Z87891 Personal history of nicotine dependence: Secondary | ICD-10-CM

## 2017-02-03 DIAGNOSIS — K219 Gastro-esophageal reflux disease without esophagitis: Secondary | ICD-10-CM | POA: Diagnosis not present

## 2017-02-03 DIAGNOSIS — R3915 Urgency of urination: Secondary | ICD-10-CM | POA: Diagnosis not present

## 2017-02-03 DIAGNOSIS — I10 Essential (primary) hypertension: Secondary | ICD-10-CM | POA: Insufficient documentation

## 2017-02-03 DIAGNOSIS — M5442 Lumbago with sciatica, left side: Secondary | ICD-10-CM | POA: Insufficient documentation

## 2017-02-03 DIAGNOSIS — K589 Irritable bowel syndrome without diarrhea: Secondary | ICD-10-CM | POA: Insufficient documentation

## 2017-02-03 DIAGNOSIS — R002 Palpitations: Secondary | ICD-10-CM

## 2017-02-03 DIAGNOSIS — G8929 Other chronic pain: Secondary | ICD-10-CM | POA: Insufficient documentation

## 2017-02-03 DIAGNOSIS — M549 Dorsalgia, unspecified: Secondary | ICD-10-CM | POA: Insufficient documentation

## 2017-02-03 DIAGNOSIS — C541 Malignant neoplasm of endometrium: Secondary | ICD-10-CM

## 2017-02-03 DIAGNOSIS — E785 Hyperlipidemia, unspecified: Secondary | ICD-10-CM | POA: Insufficient documentation

## 2017-02-03 HISTORY — DX: Dorsalgia, unspecified: M54.9

## 2017-02-03 NOTE — Patient Instructions (Signed)
Please notify Dr Denman George at phone number [phone] if you notice vaginal bleeding, new pelvic or abdominal pains, bloating, feeling full easy, or a change in bladder or bowel function.   Please return to see Dr Denman George in April, 2019.  It is safe to take the oxybutynin medication as prescribed by your doctor. If the symptoms are severe, notify your primary doctor as there may be other options with fewer side effects.

## 2017-02-03 NOTE — Progress Notes (Signed)
Follow-up Note: Gyn-Onc  Consult was requested by Dr. Alfonse Spruce for the evaluation of Allison Spencer 57 y.o. female  CC:  Chief Complaint  Patient presents with  . endometrial cancer    follow-up    Assessment/Plan:  Ms. TAMSIN NADER  is a 57 y.o.  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   She was recommended adjuvant chemotherapy but declined as she wanted to treat her cancer with "cancer fighting foods".   No evidence of disease/recurrence on today's exam.   1/ back pain - likely secondary to degeneration and obesity. No evidence of cancer causing this on post-surgical imaging. To follow-up with her orthopedic surgeon. 2/ urinary urgency - agree with oxybutynin. Discussed that if it improves her symptoms of urgency but the side effects are severe, she could discuss changing to Myrbetric with her PCP.  She is interested in treating the cancer if and when it returns, and so we will continue surveillance. I will see her back in 3 months.  HPI: Allison Spencer is a 57 year old G3P3 who is seen in consultation at the request of Dr Alfonse Spruce for grade 1 endometrial cancer. She was for a routine pap smear by her PCP, Heide Scales, FNP on 07/05/15. This revealed atypical glandular cells - endometrial. She was then seen by Dr Alfonse Spruce on 11/24/15 and reported a single episode of light pink spotting but otherwise no bleeding. 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  The patient has had a recent history of a laparoscopic sigmoid colectomy for diverticulitis in 2016. She also has a history of a laparoscopic cholecystectomy and tubal ligation.  She has morbid obesity and  severe sleep apnea.   On 01/18/16 she underwent robotic assisted total hysterectomy, BSO, and SLN biopsy. Surgery was uncomplicated and there were no suspicious findings intraoperatively.  Final pathology revealed a stage IIIC1 endometrioid endometrial adenocarcinoma with a 3.3cm tumor invading <50% (1cm of 2.4cm) with no LVSI. However, bilateral obturator SLN's revealed micrometastases on both H&E and IHC.  Postoperative imaging (including CT abdo/pelvis and chest x ray) revealed no enlarged nodes, residual disease or chest disease.  The patient was recommended to received 6 cycles of adjuvant carboplatin and paclitaxel in accordance with NCCN guidelines. She has met with medical oncology, Dr Heath Lark, but declined adjuvant chemotherapy as she was concerned about immunity.  She elected for an "anti-cancer" diet and is losing weight. She is completing antibiotics for H pylori and TB exposure.  She has been tested for Lynch syndrome (her tumor is MSI positive with loss of MLH1 expression), however, Lynch syndrome testing was negative.  Interval Hx:  Her back pain has been stable since surgery. + radicular pain down left leg. Post surgical CT imaging did not show any apparent disease. She has a known disc prolapse and is seeing an orthopedic surgeon for this later this week.  She has been prescribed oxybutynin XR for her urinary urgency but expressed concerns about using this with her history of endometrial cancer.  She has no symptoms consistent with recurrent endometrial cancer including no bleeding.     Current Meds:  Outpatient Encounter Medications as of 02/03/2017  Medication Sig  . B Complex Vitamins (VITAMIN-B COMPLEX) TABS Take 1  tablet by mouth daily.   Marland Kitchen dicyclomine (BENTYL) 10 MG capsule Take 10 mg by mouth 2 (two) times daily as needed for spasms.   Marland Kitchen escitalopram (LEXAPRO) 20 MG tablet TAKE ONE AND ONE HALF (1.5) TABLETS BY MOUTH EVERY MORNING  . fluticasone (FLONASE) 50  MCG/ACT nasal spray INSTILL 2 SPRAYS INTO EACH NOSTRIL ONCE A DAY  . HYDROmorphone (DILAUDID) 2 MG tablet Take 1 tablet (2 mg total) by mouth every 6 (six) hours as needed for severe pain.  Marland Kitchen ibuprofen (ADVIL,MOTRIN) 800 MG tablet TAKE 1 TABLET BY MOUTH 3 TIMES A DAY AS NEEDED FOR PAIN. TAKE WITH FOOD OR MILK  . ketoconazole (NIZORAL) 2 % shampoo APPLY TO WASH TWICE WEEKLY  . losartan (COZAAR) 25 MG tablet Take 25 mg by mouth daily.  . metoprolol tartrate (LOPRESSOR) 25 MG tablet TAKE 1 TABLET BY MOUTH TWICE A DAY  . oxybutynin (DITROPAN) 5 MG tablet TAKE 1 TAB(S) ORALLY ONCE (AT BEDTIME) FOR 30 DAY(S) FOR URINARY INCONTINENCE  . pantoprazole (PROTONIX) 40 MG tablet Take 40 mg by mouth 2 (two) times daily.   . pravastatin (PRAVACHOL) 20 MG tablet TAKE 1 TABLET BY MOUTH DAILY  . sucralfate (CARAFATE) 1 GM/10ML suspension Take 1 g by mouth 2 (two) times daily. With meals  . [DISCONTINUED] amoxicillin (AMOXIL) 500 MG capsule TAKE 2 (TWO) CAPSULES BY MOUTH TWO TIMES DAILY  . [DISCONTINUED] clarithromycin (BIAXIN) 500 MG tablet TAKE 1 (ONE) TABLET BY MOUTH TWO TIMES DAILY  . [DISCONTINUED] oxyCODONE-acetaminophen (PERCOCET/ROXICET) 5-325 MG tablet Take 1-2 tablets by mouth every 4 (four) hours as needed for severe pain.  . [DISCONTINUED] ranitidine (ZANTAC) 300 MG capsule Take 300 mg by mouth 2 (two) times daily as needed for heartburn.    No facility-administered encounter medications on file as of 02/03/2017.     Allergy:  Allergies  Allergen Reactions  . Codeine Nausea And Vomiting  . Sulfa Antibiotics Nausea And Vomiting    Social Hx:   Social History   Socioeconomic History  . Marital status: Married    Spouse name: Richard  . Number of children: 3  . Years of education: Not on file  . Highest education level: Not on file  Social Needs  . Financial resource strain: Not on file  . Food insecurity - worry: Not on file  . Food insecurity - inability: Not on file  . Transportation  needs - medical: Not on file  . Transportation needs - non-medical: Not on file  Occupational History  . Occupation: Non-working, applying disability  Tobacco Use  . Smoking status: Former Smoker    Packs/day: 1.00    Years: 10.00    Pack years: 10.00    Types: Cigarettes    Last attempt to quit: 12/05/1988    Years since quitting: 28.1  . Smokeless tobacco: Never Used  Substance and Sexual Activity  . Alcohol use: No  . Drug use: No  . Sexual activity: Yes    Partners: Male  Other Topics Concern  . Not on file  Social History Narrative   Taking care of her grandson who has autism, 68 years old    Past Surgical Hx:  Past Surgical History:  Procedure Laterality Date  . CHOLECYSTECTOMY  2000  . COLECTOMY  2016  . colonscopy    . ROBOTIC ASSISTED TOTAL HYSTERECTOMY WITH BILATERAL SALPINGO OOPHERECTOMY Bilateral 01/18/2016   Procedure: XI ROBOTIC ASSISTED TOTAL HYSTERECTOMY WITH BILATERAL SALPINGO OOPHORECTOMY WITH SENTINAL LYMPH NODE BIOPSY; LYSIS OF ADHESIONS;  Surgeon:  Everitt Amber, MD;  Location: [facility];  Service: Gynecology;  Laterality: Bilateral;  . TUBAL LIGATION  1991  . UPPER GI ENDOSCOPY  01/10/2016    Past Medical Hx:  Past Medical History:  Diagnosis Date  . Arthritis   . Complication of anesthesia 01/10/2016   when sw[place]ows feels like lump in throat had endo for bur feeling continues  . Depression   . Diverticulitis   . Endometrial cancer ([place])   . Essential hypertension   . 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 Family history of brain cancer   . Family history of breast cancer   . Family history of colon cancer   . GERD (gastroesophageal reflux disease)   . H. pylori infection   . Headache    migraines  . Heart palpitations   . History of hiatal hernia   . Hyperlipidemia   . IBS (irritable bowel syndrome)   . OSA (obstructive sleep apnea)    not used  last 3 weeks due to cold cpap setting of 11  . Urinary  incontinence     Past Gynecological History:  SVD x 3 No LMP recorded. Patient is postmenopausal.  Family Hx:  Family History  Problem Relation Age of Onset  . COPD Mother   . CAD Father   . Cancer Maternal Grandmother 80       breast ca  . Cancer Maternal Aunt        lung ca  . Cancer Maternal Uncle        brain ca  . Pulmonary embolism Sister   . Heart attack Brother   . COPD Brother   . Colon cancer Maternal Uncle   . Brain cancer Cousin        dx under 50    Review of Systems:  Constitutional  Feels well,    ENT Normal appearing ears and nares bilaterally Skin/Breast  No rash, sores, jaundice, itching, dryness Cardiovascular  No chest pain, shortness of breath, or edema  Pulmonary  No cough no wheeze.  Gastro Intestinal  No nausea, vomitting, or diarrhoea. No bright red blood per rectum, no abdominal pain, change in bowel movement, or constipation.  Genito Urinary  + frequency, + urgency, no dysuria, no bleeding Musculo Skeletal  + back pain (left lumbar) Neurologic  + radicular pain down left leg Psychology  No depression, anxiety, insomnia.   Vitals:  Blood pressure 128/87, pulse 77, temperature (!) 97.4 F (36.3 C), temperature source Oral, resp. rate 18, height '5\' 3"'$  (1.6 m), weight 195 lb 4.8 oz (88.6 kg), SpO2 100 %.  Physical Exam: WD in NAD Neck  Supple NROM, without any enlargements.  Lymph Node Survey No cervical supraclavicular or inguinal adenopathy Cardiovascular  Pulse normal rate, regularity and rhythm. S1 and S2 normal.  Lungs  Clear to auscultation bilateraly, without wheezes/crackles/rhonchi. Good air movement.  Skin  No rash/lesions/breakdown  Psychiatry  Alert and oriented to person, place, and time  Abdomen  Normoactive bowel sounds, abdomen soft, non-tender and obese without evidence of hernia.  Back No CVA tenderness Genito Urinary: vagina normal, no lesions, no blood. No masses.  Rectal  deferred.  Extremities  No  bilateral cyanosis, clubbing or edema.  Donaciano Eva, MD  02/03/2017, 1:42 PM

## 2017-05-09 ENCOUNTER — Inpatient Hospital Stay: Payer: Medicare Other | Admitting: Gynecologic Oncology

## 2017-05-16 ENCOUNTER — Inpatient Hospital Stay: Payer: Medicare Other | Attending: Gynecologic Oncology | Admitting: Gynecologic Oncology

## 2017-05-16 ENCOUNTER — Encounter: Payer: Self-pay | Admitting: Gynecologic Oncology

## 2017-05-16 ENCOUNTER — Inpatient Hospital Stay: Payer: Medicare Other

## 2017-05-16 VITALS — BP 120/90 | HR 65 | Temp 98.5°F | Resp 20 | Ht 63.0 in | Wt 191.5 lb

## 2017-05-16 DIAGNOSIS — Z90710 Acquired absence of both cervix and uterus: Secondary | ICD-10-CM

## 2017-05-16 DIAGNOSIS — C541 Malignant neoplasm of endometrium: Secondary | ICD-10-CM | POA: Diagnosis not present

## 2017-05-16 DIAGNOSIS — R300 Dysuria: Secondary | ICD-10-CM | POA: Insufficient documentation

## 2017-05-16 DIAGNOSIS — Z87891 Personal history of nicotine dependence: Secondary | ICD-10-CM | POA: Insufficient documentation

## 2017-05-16 DIAGNOSIS — Z90722 Acquired absence of ovaries, bilateral: Secondary | ICD-10-CM | POA: Diagnosis not present

## 2017-05-16 DIAGNOSIS — M549 Dorsalgia, unspecified: Secondary | ICD-10-CM | POA: Insufficient documentation

## 2017-05-16 LAB — BASIC METABOLIC PANEL
ANION GAP: 6 (ref 3–11)
BUN: 12 mg/dL (ref 7–26)
CALCIUM: 9.5 mg/dL (ref 8.4–10.4)
CO2: 27 mmol/L (ref 22–29)
CREATININE: 0.94 mg/dL (ref 0.60–1.10)
Chloride: 108 mmol/L (ref 98–109)
Glucose, Bld: 93 mg/dL (ref 70–140)
Potassium: 4.2 mmol/L (ref 3.5–5.1)
Sodium: 141 mmol/L (ref 136–145)

## 2017-05-16 NOTE — Patient Instructions (Addendum)
Dr Denman George will check your urine for infection.  Dr Denman George will order a CT scan to evaluate for swollen lymph nodes that indicate that the cancer has returned. We will call with the CT Scan appointment.  Dr Denman George will see you again for follow-up in 3 months.  Please notify Dr Denman George at phone number [phone] if you notice vaginal bleeding, new pelvic or abdominal pains, bloating, feeling full easy, or a change in bladder or bowel function.

## 2017-05-16 NOTE — Progress Notes (Signed)
Follow-up Note: Gyn-Onc  Consult was requested by Dr. Alfonse Spruce for the evaluation of Allison Spencer 57 y.o. female  CC:  Chief Complaint  Patient presents with  . Endometrial cancer [facility])    Assessment/Plan:  Allison Spencer  is a 57 y.o.  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   She was recommended adjuvant chemotherapy but declined as she wanted to treat her cancer with "cancer fighting foods".   No evidence of disease/recurrence on today's exam.   1/ back pain - likely secondary to degeneration and obesity. No evidence of cancer causing this on post-surgical imaging. To follow-up with her orthopedic surgeon. Will evaluate with CT abd/pelvis to ensure this back pain is not secondary to progressive lymph node metastases.  2/ urinary urgency - agree with Myrbetric with her PCP. Will check urine culture due to symptoms of dysuria  She is interested in treating the cancer if and when it returns, and so we will continue surveillance. I will see her back in 3 months.  HPI: Allison Spencer is a 57 year old G3P3 who is seen in consultation at the request of Dr Alfonse Spruce for grade 1 endometrial cancer. She was for a routine pap smear by her PCP, Heide Scales, FNP on 07/05/15. This revealed atypical glandular cells - endometrial. She was then seen by Dr Alfonse Spruce on 11/24/15 and reported a single episode of light pink spotting but otherwise no bleeding. 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  The patient has had a recent history of a laparoscopic sigmoid colectomy for diverticulitis in 2016. She also has a history of a laparoscopic cholecystectomy and tubal  ligation.  She has morbid obesity and severe sleep apnea.   On 01/18/16 she underwent robotic assisted total hysterectomy, BSO, and SLN biopsy. Surgery was uncomplicated and there were no suspicious findings intraoperatively.  Final pathology revealed a stage IIIC1 endometrioid endometrial adenocarcinoma with a 3.3cm tumor invading <50% (1cm of 2.4cm) with no LVSI. However, bilateral obturator SLN's revealed micrometastases on both H&E and IHC.  Postoperative imaging (including CT abdo/pelvis and chest x ray) revealed no enlarged nodes, residual disease or chest disease.  The patient was recommended to received 6 cycles of adjuvant carboplatin and paclitaxel in accordance with NCCN guidelines. She has met with medical oncology, Dr Heath Lark, but declined adjuvant chemotherapy as she was concerned about immunity.  She elected for an "anti-cancer" diet and is losing weight. She is completing antibiotics for H pylori and TB exposure.  She has been tested for Lynch syndrome (her tumor is MSI positive with loss of MLH1 expression), however, Lynch syndrome testing was negative.  Interval Hx:  Her back pain has been stable since surgery. + radicular pain down left leg. Post surgical CT imaging did not show any apparent disease. She has a known disc prolapse and is seeing an orthopedic surgeon for this later this week.  She has been prescribed myrbetric due to symptoms on oxybutinin.  She has some dysuria that she reports today.  She has no symptoms consistent with recurrent endometrial cancer including no bleeding.     Current Meds:  Outpatient Encounter Medications as of 05/16/2017  Medication Sig  . B Complex Vitamins (VITAMIN-B COMPLEX) TABS Take  1 tablet by mouth daily.   Marland Kitchen dicyclomine (BENTYL) 10 MG capsule Take 10 mg by mouth 2 (two) times daily as needed for spasms.   . Difluprednate (DUREZOL OP) Apply 1 drop to eye 3 (three) times daily. Durezol (shake bottle) 1 drop 3 times a  day(breakfast, lunch, and dinner)  . escitalopram (LEXAPRO) 20 MG tablet TAKE ONE AND ONE HALF (1.5) TABLETS BY MOUTH EVERY MORNING  . fluticasone (FLONASE) 50 MCG/ACT nasal spray INSTILL 2 SPRAYS INTO EACH NOSTRIL ONCE A DAY  . ketoconazole (NIZORAL) 2 % shampoo APPLY TO WASH TWICE WEEKLY  . losartan (COZAAR) 25 MG tablet Take 25 mg by mouth daily.  . metoprolol tartrate (LOPRESSOR) 25 MG tablet TAKE 1 TABLET BY MOUTH TWICE A DAY  . pantoprazole (PROTONIX) 40 MG tablet Take 40 mg by mouth 2 (two) times daily.   . pravastatin (PRAVACHOL) 20 MG tablet TAKE 1 TABLET BY MOUTH DAILY  . pregabalin (LYRICA) 75 MG capsule Lyrica 75 mg capsule  TAKE 1 CAPSULE BY MOUTH TWICE A DAY  . solifenacin (VESICARE) 10 MG tablet Vesicare 10 mg tablet  TAKE 1 TABLET BY MOUTH EVERY DAY  . sucralfate (CARAFATE) 1 GM/10ML suspension Take 1 g by mouth 2 (two) times daily. With meals  . traMADol (ULTRAM) 50 MG tablet tramadol 50 mg tablet  TAKE 1 TABLET BY MOUTH TWICE A DAY AS NEEDED  . traZODone (DESYREL) 100 MG tablet TAKE TWO AND ONE HALF (2.5) TABLETS BY MOUTH AT BEDTIME, AS NEEDED  . [START ON 05/20/2017] Besifloxacin HCl (BESIVANCE OP) Apply 1 drop to eye 3 (three) times daily. Besivance 1 drop, 3 times a day (breakfast, lunch, dinner)  . [START ON 05/20/2017] Bromfenac Sodium (PROLENSA OP) Apply 1 drop to eye at bedtime. Prolensa 1 drop @@ bedtime  . [DISCONTINUED] HYDROmorphone (DILAUDID) 2 MG tablet Take 1 tablet (2 mg total) by mouth every 6 (six) hours as needed for severe pain. (Patient not taking: Reported on 05/16/2017)  . [DISCONTINUED] ibuprofen (ADVIL,MOTRIN) 800 MG tablet TAKE 1 TABLET BY MOUTH 3 TIMES A DAY AS NEEDED FOR PAIN. TAKE WITH FOOD OR MILK  . [DISCONTINUED] oxybutynin (DITROPAN) 5 MG tablet TAKE 1 TAB(S) ORALLY ONCE (AT BEDTIME) FOR 30 DAY(S) FOR URINARY INCONTINENCE   No facility-administered encounter medications on file as of 05/16/2017.     Allergy:  Allergies  Allergen Reactions  .  Codeine Nausea And Vomiting  . Sulfa Antibiotics Nausea And Vomiting    Social Hx:   Social History   Socioeconomic History  . Marital status: Married    Spouse name: Richard  . Number of children: 3  . Years of education: Not on file  . Highest education level: Not on file  Occupational History  . Occupation: Non-working, applying disability  Social Needs  . Financial resource strain: Not on file  . Food insecurity:    Worry: Not on file    Inability: Not on file  . Transportation needs:    Medical: Not on file    Non-medical: Not on file  Tobacco Use  . Smoking status: Former Smoker    Packs/day: 1.00    Years: 10.00    Pack years: 10.00    Types: Cigarettes    Last attempt to quit: 12/05/1988    Years since quitting: 28.4  . Smokeless tobacco: Never Used  Substance and Sexual Activity  . Alcohol use: No  . Drug use: No  . Sexual activity: Yes    Partners: Male  Lifestyle  . Physical activity:    Days per week: Not on file    Minutes per session: Not on file  . Stress: Not on file  Relationships  . Social connections:    Talks on phone: Not on file    Gets together: Not on file    Attends religious service: Not on file    Active member of club or organization: Not on file    Attends meetings of clubs or organizations: Not on file    Relationship status: Not on file  . Intimate partner violence:    Fear of current or ex partner: Not on file    Emotionally abused: Not on file    Physically abused: Not on file    Forced sexual activity: Not on file  Other Topics Concern  . Not on file  Social History Narrative   Taking care of her grandson who has autism, 20 years old    Past Surgical Hx:  Past Surgical History:  Procedure Laterality Date  . CHOLECYSTECTOMY  2000  . COLECTOMY  2016  . colonscopy    . ROBOTIC ASSISTED TOTAL HYSTERECTOMY WITH BILATERAL SALPINGO OOPHERECTOMY Bilateral 01/18/2016   Procedure: XI ROBOTIC ASSISTED TOTAL HYSTERECTOMY WITH  BILATERAL SALPINGO OOPHORECTOMY WITH SENTINAL LYMPH NODE BIOPSY; LYSIS OF ADHESIONS;  Surgeon: Everitt Amber, MD;  Location: [facility];  Service: Gynecology;  Laterality: Bilateral;  . TUBAL LIGATION  1991  . UPPER GI ENDOSCOPY  01/10/2016    Past Medical Hx:  Past Medical History:  Diagnosis Date  . Arthritis   . Complication of anesthesia 01/10/2016   when [place] feels like lump in throat had endo for bur feeling continues  . Depression   . Diverticulitis   . Endometrial cancer ([place])   . Essential hypertension   . 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 Family history of brain cancer   . Family history of breast cancer   . Family history of colon cancer   . GERD (gastroesophageal reflux disease)   . H. pylori infection   . Headache    migraines  . Heart palpitations   . History of hiatal hernia   . Hyperlipidemia   . IBS (irritable bowel syndrome)   . OSA (obstructive sleep apnea)    not used  last 3 weeks due to cold cpap setting of 11  . Urinary incontinence     Past Gynecological History:  SVD x 3 No LMP recorded. Patient is postmenopausal.  Family Hx:  Family History  Problem Relation Age of Onset  . COPD Mother   . CAD Father   . Cancer Maternal Grandmother 80       breast ca  . Cancer Maternal Aunt        lung ca  . Cancer Maternal Uncle        brain ca  . Pulmonary embolism Sister   . Heart attack Brother   . COPD Brother   . Colon cancer Maternal Uncle   . Brain cancer Cousin        dx under 50    Review of Systems:  Constitutional  Feels well,    ENT Normal appearing ears and nares bilaterally Skin/Breast  No rash, sores, jaundice, itching, dryness Cardiovascular  No chest pain, shortness of breath, or edema  Pulmonary  No cough no wheeze.  Gastro Intestinal  No nausea, vomitting, or diarrhoea. No bright red blood per rectum,  no abdominal pain, change in bowel movement, or constipation.  Genito Urinary  +  frequency, + urgency, no dysuria, no bleeding Musculo Skeletal  + back pain (left lumbar) Neurologic  + radicular pain down left leg Psychology  No depression, anxiety, insomnia.   Vitals:  Blood pressure 120/90, pulse 65, temperature 98.5 F (36.9 C), temperature source Oral, resp. rate 20, height '5\' 3"'$  (1.6 m), weight 191 lb 8 oz (86.9 kg), SpO2 98 %.  Physical Exam: WD in NAD Neck  Supple NROM, without any enlargements.  Lymph Node Survey No cervical supraclavicular or inguinal adenopathy Cardiovascular  Pulse normal rate, regularity and rhythm. S1 and S2 normal.  Lungs  Clear to auscultation bilateraly, without wheezes/crackles/rhonchi. Good air movement.  Skin  No rash/lesions/breakdown  Psychiatry  Alert and oriented to person, place, and time  Abdomen  Normoactive bowel sounds, abdomen soft, non-tender and obese without evidence of hernia.  Back No CVA tenderness Genito Urinary: vagina normal, no lesions, no blood. No masses.  Rectal  deferred.  Extremities  No bilateral cyanosis, clubbing or edema.  Thereasa Solo, MD  05/16/2017, 1:30 PM

## 2017-05-17 LAB — URINE CULTURE

## 2017-05-19 ENCOUNTER — Telehealth: Payer: Self-pay | Admitting: *Deleted

## 2017-05-19 ENCOUNTER — Telehealth: Payer: Self-pay

## 2017-05-19 DIAGNOSIS — R300 Dysuria: Secondary | ICD-10-CM

## 2017-05-19 MED ORDER — AMOXICILLIN 250 MG PO CAPS: 250.0000 mg | ORAL_CAPSULE | Freq: Three times a day (TID) | ORAL | 0 refills | Status: DC

## 2017-05-19 NOTE — Telephone Encounter (Signed)
Told Allison Spencer that Dr. Denman George wants to treat the organism seen on urine culture from 05-16-17 so it does not get worse and she is more symptomatic. Sent in Amoxicillin 250 mg tas tid x 7 days.  Instructed patient to take all the tablets to clear the infection. Pt verbalized understanding.

## 2017-05-19 NOTE — Telephone Encounter (Signed)
Faxed the orders for the CT scans along with the authorization to [facility]. The patient is scheduled for the scans on May 15th at [facility] and gave the patient the appt date/time, also ask the patient to pick up her contrast from the outpatient center by 4pm tomorrow.

## 2017-05-26 ENCOUNTER — Telehealth: Payer: Self-pay

## 2017-05-26 NOTE — Telephone Encounter (Signed)
Told Ms Say that the CT scan shows no evidence of disease per Melissa Cross,NP.  Keep appointment as scheduled in August as scheduled.

## 2017-08-07 ENCOUNTER — Telehealth: Payer: Self-pay | Admitting: *Deleted

## 2017-08-07 NOTE — Telephone Encounter (Signed)
Called and rescheduled patient's appt earlier tomorrow.

## 2017-08-08 ENCOUNTER — Encounter: Payer: Self-pay | Admitting: Gynecologic Oncology

## 2017-08-08 ENCOUNTER — Inpatient Hospital Stay: Payer: Medicare Other | Attending: Gynecologic Oncology | Admitting: Gynecologic Oncology

## 2017-08-08 VITALS — BP 119/78 | HR 69 | Temp 97.7°F | Resp 18 | Ht 63.0 in | Wt 189.4 lb

## 2017-08-08 DIAGNOSIS — R3915 Urgency of urination: Secondary | ICD-10-CM | POA: Diagnosis not present

## 2017-08-08 DIAGNOSIS — Z08 Encounter for follow-up examination after completed treatment for malignant neoplasm: Secondary | ICD-10-CM | POA: Diagnosis not present

## 2017-08-08 DIAGNOSIS — Z90710 Acquired absence of both cervix and uterus: Secondary | ICD-10-CM | POA: Diagnosis not present

## 2017-08-08 DIAGNOSIS — Z8542 Personal history of malignant neoplasm of other parts of uterus: Secondary | ICD-10-CM | POA: Insufficient documentation

## 2017-08-08 DIAGNOSIS — C541 Malignant neoplasm of endometrium: Secondary | ICD-10-CM

## 2017-08-08 DIAGNOSIS — Z90722 Acquired absence of ovaries, bilateral: Secondary | ICD-10-CM | POA: Diagnosis not present

## 2017-08-08 DIAGNOSIS — F17210 Nicotine dependence, cigarettes, uncomplicated: Secondary | ICD-10-CM | POA: Diagnosis not present

## 2017-08-08 NOTE — Progress Notes (Signed)
Follow-up Note: Gyn-Onc  Consult was requested by Dr. Alfonse Spruce for the evaluation of Allison Spencer 57 y.o. female  CC:  Chief Complaint  Patient presents with  . Endometrial cancer [facility])    Assessment/Plan:  Ms. TIMI REESER  is a 57 y.o.  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   She was recommended adjuvant chemotherapy but declined as she wanted to treat her cancer with "cancer fighting foods".   No evidence of disease/recurrence on today's exam.   1/ back pain - likely secondary to degeneration and obesity. No evidence of cancer causing this on recent imaging.   2/ urinary urgency - agree with Myrbetric with her PCP. Will check urine culture due to symptoms of dysuria  She is interested in treating the cancer if and when it returns, and so we will continue surveillance. I will see her back in 6 months.  HPI: Garrett Bowring is a 57 year old G3P3 who is seen in consultation at the request of Dr Alfonse Spruce for grade 1 endometrial cancer. She was for a routine pap smear by her PCP, Heide Scales, FNP on 07/05/15. This revealed atypical glandular cells - endometrial. She was then seen by Dr Alfonse Spruce on 11/24/15 and reported a single episode of light pink spotting but otherwise no bleeding. 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  The patient has had a recent history of a laparoscopic sigmoid colectomy for diverticulitis in 2016. She also has a history of a laparoscopic cholecystectomy and tubal ligation.  She has morbid obesity and severe sleep apnea.   On 01/18/16 she underwent robotic assisted total hysterectomy, BSO, and SLN biopsy. Surgery was uncomplicated  and there were no suspicious findings intraoperatively.  Final pathology revealed a stage IIIC1 endometrioid endometrial adenocarcinoma with a 3.3cm tumor invading <50% (1cm of 2.4cm) with no LVSI. However, bilateral obturator SLN's revealed micrometastases on both H&E and IHC.  Postoperative imaging (including CT abdo/pelvis and chest x ray) revealed no enlarged nodes, residual disease or chest disease.  The patient was recommended to received 6 cycles of adjuvant carboplatin and paclitaxel in accordance with NCCN guidelines. She has met with medical oncology, Dr Heath Lark, but declined adjuvant chemotherapy as she was concerned about immunity.  She elected for an "anti-cancer" diet and is losing weight. She is completing antibiotics for H pylori and TB exposure.  She has been tested for Lynch syndrome (her tumor is MSI positive with loss of MLH1 expression), however, Lynch syndrome testing was negative.  Interval Hx:  Her back pain has been stable since surgery. In 2019 she reported + radicular pain down left leg. Post surgical CT imaging did not show any apparent disease. She has a known disc prolapse and is seeing an orthopedic surgeon for this later this week. Repeat CT scan on 05/26/17 showed no evidence of recurrence including no adenopathy in retroperitoneal region, and no explanation for her pain.   She has been prescribed myrbetric due to symptoms on oxybutinin.  She has some dysuria that she reports today.  She has no symptoms consistent with recurrent endometrial cancer including no bleeding.     Current Meds:  Outpatient Encounter Medications as of 08/08/2017  Medication Sig  . B Complex Vitamins (  VITAMIN-B COMPLEX) TABS Take 1 tablet by mouth daily.   Marland Kitchen dicyclomine (BENTYL) 10 MG capsule Take 10 mg by mouth 2 (two) times daily as needed for spasms.   Marland Kitchen escitalopram (LEXAPRO) 20 MG tablet TAKE ONE AND ONE HALF (1.5) TABLETS BY MOUTH EVERY MORNING  . losartan (COZAAR) 25 MG  tablet Take 25 mg by mouth daily.  . pantoprazole (PROTONIX) 40 MG tablet Take 40 mg by mouth 2 (two) times daily.   . pravastatin (PRAVACHOL) 20 MG tablet TAKE 1 TABLET BY MOUTH DAILY  . pregabalin (LYRICA) 75 MG capsule Lyrica 75 mg capsule  TAKE 1 CAPSULE BY MOUTH TWICE A DAY  . solifenacin (VESICARE) 10 MG tablet Vesicare 10 mg tablet  TAKE 1 TABLET BY MOUTH EVERY DAY  . traMADol (ULTRAM) 50 MG tablet tramadol 50 mg tablet  TAKE 1 TABLET BY MOUTH TWICE A DAY AS NEEDED  . traZODone (DESYREL) 100 MG tablet TAKE TWO AND ONE HALF (2.5) TABLETS BY MOUTH AT BEDTIME, AS NEEDED  . fluticasone (FLONASE) 50 MCG/ACT nasal spray INSTILL 2 SPRAYS INTO EACH NOSTRIL ONCE A DAY  . [DISCONTINUED] amoxicillin (AMOXIL) 250 MG capsule Take 1 capsule (250 mg total) by mouth 3 (three) times daily. (Patient not taking: Reported on 08/08/2017)  . [DISCONTINUED] Besifloxacin HCl (BESIVANCE OP) Apply 1 drop to eye 3 (three) times daily. Besivance 1 drop, 3 times a day (breakfast, lunch, dinner)  . [DISCONTINUED] Bromfenac Sodium (PROLENSA OP) Apply 1 drop to eye at bedtime. Prolensa 1 drop @@ bedtime  . [DISCONTINUED] Difluprednate (DUREZOL OP) Apply 1 drop to eye 3 (three) times daily. Durezol (shake bottle) 1 drop 3 times a day(breakfast, lunch, and dinner)  . [DISCONTINUED] ketoconazole (NIZORAL) 2 % shampoo APPLY TO WASH TWICE WEEKLY  . [DISCONTINUED] metoprolol tartrate (LOPRESSOR) 25 MG tablet TAKE 1 TABLET BY MOUTH TWICE A DAY  . [DISCONTINUED] sucralfate (CARAFATE) 1 GM/10ML suspension Take 1 g by mouth 2 (two) times daily. With meals   No facility-administered encounter medications on file as of 08/08/2017.     Allergy:  Allergies  Allergen Reactions  . Codeine Nausea And Vomiting  . Sulfa Antibiotics Nausea And Vomiting    Social Hx:   Social History   Socioeconomic History  . Marital status: Married    Spouse name: Richard  . Number of children: 3  . Years of education: Not on file  . Highest  education level: Not on file  Occupational History  . Occupation: Non-working, applying disability  Social Needs  . Financial resource strain: Not on file  . Food insecurity:    Worry: Not on file    Inability: Not on file  . Transportation needs:    Medical: Not on file    Non-medical: Not on file  Tobacco Use  . Smoking status: Former Smoker    Packs/day: 1.00    Years: 10.00    Pack years: 10.00    Types: Cigarettes    Last attempt to quit: 12/05/1988    Years since quitting: 28.6  . Smokeless tobacco: Never Used  Substance and Sexual Activity  . Alcohol use: No  . Drug use: No  . Sexual activity: Yes    Partners: Male  Lifestyle  . Physical activity:    Days per week: Not on file    Minutes per session: Not on file  . Stress: Not on file  Relationships  . Social connections:    Talks on phone: Not on file    Gets  together: Not on file    Attends religious service: Not on file    Active member of club or organization: Not on file    Attends meetings of clubs or organizations: Not on file    Relationship status: Not on file  . Intimate partner violence:    Fear of current or ex partner: Not on file    Emotionally abused: Not on file    Physically abused: Not on file    Forced sexual activity: Not on file  Other Topics Concern  . Not on file  Social History Narrative   Taking care of her grandson who has autism, 38 years old    Past Surgical Hx:  Past Surgical History:  Procedure Laterality Date  . CHOLECYSTECTOMY  2000  . COLECTOMY  2016  . colonscopy    . ROBOTIC ASSISTED TOTAL HYSTERECTOMY WITH BILATERAL SALPINGO OOPHERECTOMY Bilateral 01/18/2016   Procedure: XI ROBOTIC ASSISTED TOTAL HYSTERECTOMY WITH BILATERAL SALPINGO OOPHORECTOMY WITH SENTINAL LYMPH NODE BIOPSY; LYSIS OF ADHESIONS;  Surgeon: Everitt Amber, MD;  Location: [facility];  Service: Gynecology;  Laterality: Bilateral;  . TUBAL LIGATION  1991  . UPPER GI ENDOSCOPY  01/10/2016    Past Medical Hx:   Past Medical History:  Diagnosis Date  . Arthritis   . Complication of anesthesia 01/10/2016   when [place] feels like lump in throat had endo for bur feeling continues  . Depression   . Diverticulitis   . Endometrial cancer ([place])   . Essential hypertension   . 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 Family history of brain cancer   . Family history of breast cancer   . Family history of colon cancer   . GERD (gastroesophageal reflux disease)   . H. pylori infection   . Headache    migraines  . Heart palpitations   . History of hiatal hernia   . Hyperlipidemia   . IBS (irritable bowel syndrome)   . OSA (obstructive sleep apnea)    not used  last 3 weeks due to cold cpap setting of 11  . Urinary incontinence     Past Gynecological History:  SVD x 3 No LMP recorded. Patient is postmenopausal.  Family Hx:  Family History  Problem Relation Age of Onset  . COPD Mother   . CAD Father   . Cancer Maternal Grandmother 80       breast ca  . Cancer Maternal Aunt        lung ca  . Cancer Maternal Uncle        brain ca  . Pulmonary embolism Sister   . Heart attack Brother   . COPD Brother   . Colon cancer Maternal Uncle   . Brain cancer Cousin        dx under 50    Review of Systems:  Constitutional  Feels well,    ENT Normal appearing ears and nares bilaterally Skin/Breast  No rash, sores, jaundice, itching, dryness Cardiovascular  No chest pain, shortness of breath, or edema  Pulmonary  No cough no wheeze.  Gastro Intestinal  No nausea, vomitting, or diarrhoea. No bright red blood per rectum, no abdominal pain, change in bowel movement, or constipation.  Genito Urinary  + frequency, + urgency, no dysuria, no bleeding Musculo Skeletal  + back pain (left lumbar) Neurologic  + radicular pain down left leg Psychology  No depression, anxiety, insomnia.   Vitals:  Blood  pressure 119/78, pulse 69, temperature 97.7 F (36.5  C), temperature source Oral, resp. rate 18, height '5\' 3"'$  (1.6 m), weight 189 lb 6.4 oz (85.9 kg), SpO2 100 %.  Physical Exam: WD in NAD Neck  Supple NROM, without any enlargements.  Lymph Node Survey No cervical supraclavicular or inguinal adenopathy Cardiovascular  Pulse normal rate, regularity and rhythm. S1 and S2 normal.  Lungs  Clear to auscultation bilateraly, without wheezes/crackles/rhonchi. Good air movement.  Skin  No rash/lesions/breakdown  Psychiatry  Alert and oriented to person, place, and time  Abdomen  Normoactive bowel sounds, abdomen soft, non-tender and obese without evidence of hernia.  Back No CVA tenderness Genito Urinary: vagina normal, no lesions, no blood. No masses.  Rectal  deferred.  Extremities  No bilateral cyanosis, clubbing or edema.  Thereasa Solo, MD  08/08/2017, 12:47 PM

## 2017-08-08 NOTE — Patient Instructions (Signed)
Please notify Dr Denman George at phone number [phone] if you notice vaginal bleeding, new pelvic or abdominal pains, bloating, feeling full easy, or a change in bladder or bowel function. '  Please return to see Dr Denman George in February, 2020. Contact her office at the above number after October to schedule this.

## 2017-11-05 DIAGNOSIS — M47816 Spondylosis without myelopathy or radiculopathy, lumbar region: Secondary | ICD-10-CM

## 2017-11-05 HISTORY — DX: Spondylosis without myelopathy or radiculopathy, lumbar region: M47.816

## 2017-11-06 ENCOUNTER — Telehealth: Payer: Self-pay

## 2017-11-06 NOTE — Telephone Encounter (Signed)
Incoming appt from pt to make her 6 month f/u appt with Dr Denman George.  Appt made for Jan 30 th - 12:40 arrival time. No other needs per pt at this time.

## 2018-02-05 ENCOUNTER — Inpatient Hospital Stay: Payer: Medicare Other | Attending: Gynecologic Oncology | Admitting: Gynecologic Oncology

## 2018-02-05 ENCOUNTER — Encounter: Payer: Self-pay | Admitting: Gynecologic Oncology

## 2018-02-05 VITALS — BP 119/83 | HR 77 | Temp 98.2°F | Resp 18 | Ht 63.0 in | Wt 198.0 lb

## 2018-02-05 DIAGNOSIS — C541 Malignant neoplasm of endometrium: Secondary | ICD-10-CM | POA: Diagnosis not present

## 2018-02-05 DIAGNOSIS — Z87891 Personal history of nicotine dependence: Secondary | ICD-10-CM | POA: Diagnosis not present

## 2018-02-05 DIAGNOSIS — Z90710 Acquired absence of both cervix and uterus: Secondary | ICD-10-CM | POA: Diagnosis not present

## 2018-02-05 DIAGNOSIS — Z90722 Acquired absence of ovaries, bilateral: Secondary | ICD-10-CM

## 2018-02-05 NOTE — Patient Instructions (Signed)
Please notify Dr Denman George at phone number [phone] if you notice vaginal bleeding, new pelvic or abdominal pains, bloating, feeling full easy, or a change in bladder or bowel function.   Please contact Dr Serita Grit office (at [phone]) in April, 2020 to request an appointment with her for July, 2020.

## 2018-02-05 NOTE — Progress Notes (Signed)
Follow-up Note: Gyn-Onc  Consult was initially requested by Dr. Alfonse Spruce for the evaluation of Allison Spencer 58 y.o. female  CC:  Chief Complaint  Patient presents with  . Endometrial cancer [facility])    Assessment/Plan:  Allison Spencer  is a 58 y.o.  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   She was recommended adjuvant chemotherapy but declined as she wanted to treat her cancer with "cancer fighting foods".   No evidence of disease/recurrence on today's exam.   1/ back pain - likely secondary to degeneration and obesity. No evidence of cancer causing this on recent imaging.   2/ persistent cough - imaging work-up at [facility] negative for apparent metastatic endometrial cancer  She is interested in treating the cancer if and when it returns, and so we will continue surveillance. I will see her back in 6 months.  HPI: Allison Spencer is a 58 year old G3P3 who is seen in consultation at the request of Dr Alfonse Spruce for grade 1 endometrial cancer. She was for a routine pap smear by her PCP, Heide Scales, FNP on 07/05/15. This revealed atypical glandular cells - endometrial. She was then seen by Dr Alfonse Spruce on 11/24/15 and reported a single episode of light pink spotting but otherwise no bleeding. 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  The patient has had a recent history of a laparoscopic sigmoid colectomy for diverticulitis in 2016. She also has a history of a laparoscopic cholecystectomy and tubal ligation.  She has morbid obesity and severe sleep apnea.   On 01/18/16 she underwent robotic assisted total hysterectomy, BSO, and SLN biopsy. Surgery was  uncomplicated and there were no suspicious findings intraoperatively.  Final pathology revealed a stage IIIC1 endometrioid endometrial adenocarcinoma with a 3.3cm tumor invading <50% (1cm of 2.4cm) with no LVSI. However, bilateral obturator SLN's revealed micrometastases on both H&E and IHC.  Postoperative imaging (including CT abdo/pelvis and chest x ray) revealed no enlarged nodes, residual disease or chest disease.  The patient was recommended to received 6 cycles of adjuvant carboplatin and paclitaxel in accordance with NCCN guidelines. She has met with medical oncology, Dr Heath Lark, but declined adjuvant chemotherapy as she was concerned about immunity.  She elected for an "anti-cancer" diet and is losing weight. She is completing antibiotics for H pylori and TB exposure.  She has been tested for Lynch syndrome (her tumor is MSI positive with loss of MLH1 expression), however, Lynch syndrome testing was negative.  Her back pain has been stable since surgery. In 2019 she reported + radicular pain down left leg. Post surgical CT imaging did not show any apparent disease. She has a known disc prolapse and is seeing an orthopedic surgeon for this later this week. Repeat CT scan on 05/26/17 showed no evidence of recurrence including no adenopathy in retroperitoneal region, and no explanation for her pain.   Interval Hx:  She has had a persistent cough since October, 2019. CT chest in October, 2019 showed a tiny subpleural nodule in the anterior aspect of the left lower lobe abutting the major fissure. Recommended annual follow-up. Suspected to be benign. Stable liver cyst also seen. CXR on 02/03/17 showed normal heart and lungs.   She has no  symptoms consistent with recurrent endometrial cancer including no bleeding.     Current Meds:  Outpatient Encounter Medications as of 02/05/2018  Medication Sig  . B Complex Vitamins (VITAMIN-B COMPLEX) TABS Take 1 tablet by mouth daily.   Marland Kitchen dicyclomine  (BENTYL) 10 MG capsule Take 10 mg by mouth 2 (two) times daily as needed for spasms.   Marland Kitchen escitalopram (LEXAPRO) 20 MG tablet TAKE ONE AND ONE HALF (1.5) TABLETS BY MOUTH EVERY MORNING  . fluticasone (FLONASE) 50 MCG/ACT nasal spray INSTILL 2 SPRAYS INTO EACH NOSTRIL ONCE A DAY  . losartan (COZAAR) 25 MG tablet Take 25 mg by mouth daily.  . pantoprazole (PROTONIX) 40 MG tablet Take 40 mg by mouth 2 (two) times daily.   . pravastatin (PRAVACHOL) 20 MG tablet TAKE 1 TABLET BY MOUTH DAILY  . pregabalin (LYRICA) 75 MG capsule Lyrica 75 mg capsule  TAKE 1 CAPSULE BY MOUTH TWICE A DAY  . solifenacin (VESICARE) 10 MG tablet Vesicare 10 mg tablet  TAKE 1 TABLET BY MOUTH EVERY DAY  . traMADol (ULTRAM) 50 MG tablet tramadol 50 mg tablet  TAKE 1 TABLET BY MOUTH TWICE A DAY AS NEEDED  . [DISCONTINUED] traZODone (DESYREL) 100 MG tablet TAKE TWO AND ONE HALF (2.5) TABLETS BY MOUTH AT BEDTIME, AS NEEDED   No facility-administered encounter medications on file as of 02/05/2018.     Allergy:  Allergies  Allergen Reactions  . Codeine Nausea And Vomiting  . Sulfa Antibiotics Nausea And Vomiting    Social Hx:   Social History   Socioeconomic History  . Marital status: Married    Spouse name: Richard  . Number of children: 3  . Years of education: Not on file  . Highest education level: Not on file  Occupational History  . Occupation: Non-working, applying disability  Social Needs  . Financial resource strain: Not on file  . Food insecurity:    Worry: Not on file    Inability: Not on file  . Transportation needs:    Medical: Not on file    Non-medical: Not on file  Tobacco Use  . Smoking status: Former Smoker    Packs/day: 1.00    Years: 10.00    Pack years: 10.00    Types: Cigarettes    Last attempt to quit: 12/05/1988    Years since quitting: 29.1  . Smokeless tobacco: Never Used  Substance and Sexual Activity  . Alcohol use: No  . Drug use: No  . Sexual activity: Yes    Partners:  Male  Lifestyle  . Physical activity:    Days per week: Not on file    Minutes per session: Not on file  . Stress: Not on file  Relationships  . Social connections:    Talks on phone: Not on file    Gets together: Not on file    Attends religious service: Not on file    Active member of club or organization: Not on file    Attends meetings of clubs or organizations: Not on file    Relationship status: Not on file  . Intimate partner violence:    Fear of current or ex partner: Not on file    Emotionally abused: Not on file    Physically abused: Not on file    Forced sexual activity: Not on file  Other Topics Concern  . Not on file  Social History Narrative   Taking care of her grandson who has autism, 53 years old  Past Surgical Hx:  Past Surgical History:  Procedure Laterality Date  . CHOLECYSTECTOMY  2000  . COLECTOMY  2016  . colonscopy    . ROBOTIC ASSISTED TOTAL HYSTERECTOMY WITH BILATERAL SALPINGO OOPHERECTOMY Bilateral 01/18/2016   Procedure: XI ROBOTIC ASSISTED TOTAL HYSTERECTOMY WITH BILATERAL SALPINGO OOPHORECTOMY WITH SENTINAL LYMPH NODE BIOPSY; LYSIS OF ADHESIONS;  Surgeon: Everitt Amber, MD;  Location: [facility];  Service: Gynecology;  Laterality: Bilateral;  . TUBAL LIGATION  1991  . UPPER GI ENDOSCOPY  01/10/2016    Past Medical Hx:  Past Medical History:  Diagnosis Date  . Arthritis   . Complication of anesthesia 01/10/2016   when [place] feels like lump in throat had endo for bur feeling continues  . Depression   . Diverticulitis   . Endometrial cancer ([place])   . Essential hypertension   . 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 Family history of brain cancer   . Family history of breast cancer   . Family history of colon cancer   . GERD (gastroesophageal reflux disease)   . H. pylori infection   . Headache    migraines  . Heart palpitations   . History of hiatal hernia   . Hyperlipidemia   . IBS (irritable bowel  syndrome)   . OSA (obstructive sleep apnea)    not used  last 3 weeks due to cold cpap setting of 11  . Urinary incontinence     Past Gynecological History:  SVD x 3 No LMP recorded. Patient is postmenopausal.  Family Hx:  Family History  Problem Relation Age of Onset  . COPD Mother   . CAD Father   . Cancer Maternal Grandmother 80       breast ca  . Cancer Maternal Aunt        lung ca  . Cancer Maternal Uncle        brain ca  . Pulmonary embolism Sister   . Heart attack Brother   . COPD Brother   . Colon cancer Maternal Uncle   . Brain cancer Cousin        dx under 50    Review of Systems:  Constitutional  Feels well,    ENT Normal appearing ears and nares bilaterally Skin/Breast  No rash, sores, jaundice, itching, dryness Cardiovascular  No chest pain, shortness of breath, or edema  Pulmonary  No cough no wheeze.  Gastro Intestinal  No nausea, vomitting, or diarrhoea. No bright red blood per rectum, no abdominal pain, change in bowel movement, or constipation.  Genito Urinary  + frequency, + urgency, no dysuria, no bleeding Musculo Skeletal  + back pain (left lumbar) Neurologic  + radicular pain down left leg Psychology  No depression, anxiety, insomnia.   Vitals:  Blood pressure 119/83, pulse 77, temperature 98.2 F (36.8 C), temperature source Oral, resp. rate 18, height '5\' 3"'$  (1.6 m), weight 198 lb (89.8 kg), SpO2 97 %.  Physical Exam: WD in NAD Neck  Supple NROM, without any enlargements.  Lymph Node Survey No cervical supraclavicular or inguinal adenopathy Cardiovascular  Pulse normal rate, regularity and rhythm. S1 and S2 normal.  Lungs  Clear to auscultation bilateraly, without wheezes/crackles/rhonchi. Good air movement.  Skin  No rash/lesions/breakdown  Psychiatry  Alert and oriented to person, place, and time  Abdomen  Normoactive bowel sounds, abdomen soft, non-tender and obese without evidence of hernia.  Back No CVA  tenderness Genito Urinary: vagina normal,  no lesions, no blood. No masses.  Rectal  deferred.  Extremities  No bilateral cyanosis, clubbing or edema.  Thereasa Solo, MD  02/05/2018, 1:29 PM

## 2018-08-31 ENCOUNTER — Telehealth: Payer: Self-pay | Admitting: *Deleted

## 2018-08-31 NOTE — Telephone Encounter (Signed)
Patient called and scheduled a follow up appt  

## 2018-09-23 ENCOUNTER — Encounter: Payer: Self-pay | Admitting: Gynecologic Oncology

## 2018-09-23 ENCOUNTER — Inpatient Hospital Stay: Payer: Medicare Other | Attending: Gynecologic Oncology | Admitting: Gynecologic Oncology

## 2018-09-23 ENCOUNTER — Other Ambulatory Visit: Payer: Self-pay

## 2018-09-23 VITALS — BP 123/83 | HR 78 | Temp 98.0°F | Resp 18 | Ht 63.0 in | Wt 210.8 lb

## 2018-09-23 DIAGNOSIS — Z8542 Personal history of malignant neoplasm of other parts of uterus: Secondary | ICD-10-CM | POA: Diagnosis not present

## 2018-09-23 DIAGNOSIS — R32 Unspecified urinary incontinence: Secondary | ICD-10-CM | POA: Diagnosis not present

## 2018-09-23 DIAGNOSIS — Z800 Family history of malignant neoplasm of digestive organs: Secondary | ICD-10-CM | POA: Diagnosis not present

## 2018-09-23 DIAGNOSIS — E785 Hyperlipidemia, unspecified: Secondary | ICD-10-CM | POA: Diagnosis not present

## 2018-09-23 DIAGNOSIS — G8929 Other chronic pain: Secondary | ICD-10-CM | POA: Diagnosis not present

## 2018-09-23 DIAGNOSIS — M5442 Lumbago with sciatica, left side: Secondary | ICD-10-CM | POA: Diagnosis not present

## 2018-09-23 DIAGNOSIS — K219 Gastro-esophageal reflux disease without esophagitis: Secondary | ICD-10-CM | POA: Diagnosis not present

## 2018-09-23 DIAGNOSIS — Z87891 Personal history of nicotine dependence: Secondary | ICD-10-CM | POA: Diagnosis not present

## 2018-09-23 DIAGNOSIS — Z79899 Other long term (current) drug therapy: Secondary | ICD-10-CM | POA: Insufficient documentation

## 2018-09-23 DIAGNOSIS — Z201 Contact with and (suspected) exposure to tuberculosis: Secondary | ICD-10-CM | POA: Diagnosis not present

## 2018-09-23 DIAGNOSIS — R911 Solitary pulmonary nodule: Secondary | ICD-10-CM | POA: Diagnosis not present

## 2018-09-23 DIAGNOSIS — C541 Malignant neoplasm of endometrium: Secondary | ICD-10-CM

## 2018-09-23 DIAGNOSIS — M1990 Unspecified osteoarthritis, unspecified site: Secondary | ICD-10-CM | POA: Insufficient documentation

## 2018-09-23 DIAGNOSIS — Z803 Family history of malignant neoplasm of breast: Secondary | ICD-10-CM | POA: Diagnosis not present

## 2018-09-23 DIAGNOSIS — Z08 Encounter for follow-up examination after completed treatment for malignant neoplasm: Secondary | ICD-10-CM | POA: Diagnosis present

## 2018-09-23 DIAGNOSIS — R002 Palpitations: Secondary | ICD-10-CM | POA: Insufficient documentation

## 2018-09-23 DIAGNOSIS — G4733 Obstructive sleep apnea (adult) (pediatric): Secondary | ICD-10-CM | POA: Diagnosis not present

## 2018-09-23 DIAGNOSIS — Z90710 Acquired absence of both cervix and uterus: Secondary | ICD-10-CM

## 2018-09-23 DIAGNOSIS — I10 Essential (primary) hypertension: Secondary | ICD-10-CM | POA: Insufficient documentation

## 2018-09-23 DIAGNOSIS — K589 Irritable bowel syndrome without diarrhea: Secondary | ICD-10-CM | POA: Diagnosis not present

## 2018-09-23 DIAGNOSIS — Z90722 Acquired absence of ovaries, bilateral: Secondary | ICD-10-CM | POA: Insufficient documentation

## 2018-09-23 DIAGNOSIS — Z808 Family history of malignant neoplasm of other organs or systems: Secondary | ICD-10-CM | POA: Diagnosis not present

## 2018-09-23 DIAGNOSIS — F329 Major depressive disorder, single episode, unspecified: Secondary | ICD-10-CM | POA: Diagnosis not present

## 2018-09-23 NOTE — Patient Instructions (Signed)
Please notify Dr Denman George at phone number [phone] if you notice vaginal bleeding, new pelvic or abdominal pains, bloating, feeling full easy, or a change in bladder or bowel function.   Please contact Dr Serita Grit office (at [phone]) in January, 2021 to request an appointment with her for March, 2021.  Dr Denman George will order a CT scan to re-evaluate your disease. This will be done at [facility]

## 2018-09-23 NOTE — Progress Notes (Signed)
Follow-up Note: Gyn-Onc  Consult was initially requested by Dr. Alfonse Spruce for the evaluation of Allison Spencer 58 y.o. female  CC:  Chief Complaint  Patient presents with  . endometrial cancer    follow-up    Assessment/Plan:  Allison Spencer  is a 58 y.o.  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   She was recommended adjuvant chemotherapy but declined as she wanted to treat her cancer with "cancer fighting foods".   No evidence of disease/recurrence on today's exam.   1/ back pain - likely secondary to degeneration and obesity. Repeat imaging to ensure back pain is not secondary to lymph node progression. Hx of lung nodule needs follow-up imaging.   She is interested in treating the cancer if and when it returns, and so we will continue surveillance. I will see her back in 6 months.  HPI: Allison Spencer is a 58 year old G3P3 who is seen in consultation at the request of Dr Alfonse Spruce for grade 1 endometrial cancer. She was for a routine pap smear by her PCP, Heide Scales, FNP on 07/05/15. This revealed atypical glandular cells - endometrial. She was then seen by Dr Alfonse Spruce on 11/24/15 and reported a single episode of light pink spotting but otherwise no bleeding. 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  The patient has had a recent history of a laparoscopic sigmoid colectomy for diverticulitis in 2016. She also has a history of a laparoscopic cholecystectomy and tubal ligation.  She has morbid obesity and severe sleep apnea.   On 01/18/16 she underwent robotic assisted total hysterectomy, BSO, and SLN biopsy. Surgery was uncomplicated and there were no  suspicious findings intraoperatively.  Final pathology revealed a stage IIIC1 endometrioid endometrial adenocarcinoma with a 3.3cm tumor invading <50% (1cm of 2.4cm) with no LVSI. However, bilateral obturator SLN's revealed micrometastases on both H&E and IHC.  Postoperative imaging (including CT abdo/pelvis and chest x ray) revealed no enlarged nodes, residual disease or chest disease.  The patient was recommended to received 6 cycles of adjuvant carboplatin and paclitaxel in accordance with NCCN guidelines. She has met with medical oncology, Dr Heath Lark, but declined adjuvant chemotherapy as she was concerned about immunity.  She elected for an "anti-cancer" diet and is losing weight. She is completing antibiotics for H pylori and TB exposure.  She has been tested for Lynch syndrome (her tumor is MSI positive with loss of MLH1 expression), however, Lynch syndrome testing was negative.  Her back pain has been stable since surgery. In 2019 she reported + radicular pain down left leg. Post surgical CT imaging did not show any apparent disease. She has a known disc prolapse and is seeing an orthopedic surgeon for this later this week. Repeat CT scan on 05/26/17 showed no evidence of recurrence including no adenopathy in retroperitoneal region, and no explanation for her pain.   Interval Hx:  She has had a persistent cough since October, 2019. CT chest in October, 2019 showed a tiny subpleural nodule in the anterior aspect of the left lower lobe abutting the major fissure. Recommended annual follow-up. Suspected to be benign. Stable liver cyst also seen. CXR on 02/03/17 showed normal heart and lungs.   She has no symptoms  consistent with recurrent endometrial cancer including no bleeding.    She has progressive low back pain, sciatic pain and neck pains.    Current Meds:  Outpatient Encounter Medications as of 09/23/2018  Medication Sig  . B Complex Vitamins (VITAMIN-B COMPLEX) TABS Take 1  tablet by mouth daily.   Marland Kitchen dicyclomine (BENTYL) 10 MG capsule Take 10 mg by mouth 2 (two) times daily as needed for spasms.   Marland Kitchen escitalopram (LEXAPRO) 20 MG tablet TAKE ONE AND ONE HALF (1.5) TABLETS BY MOUTH EVERY MORNING  . fluticasone (FLONASE) 50 MCG/ACT nasal spray INSTILL 2 SPRAYS INTO EACH NOSTRIL ONCE A DAY  . losartan (COZAAR) 25 MG tablet Take 25 mg by mouth daily.  . pantoprazole (PROTONIX) 40 MG tablet Take 40 mg by mouth 2 (two) times daily.   . pravastatin (PRAVACHOL) 20 MG tablet TAKE 1 TABLET BY MOUTH DAILY  . pregabalin (LYRICA) 75 MG capsule Lyrica 75 mg capsule  TAKE 1 CAPSULE BY MOUTH TWICE A DAY  . solifenacin (VESICARE) 10 MG tablet Vesicare 10 mg tablet  TAKE 1 TABLET BY MOUTH EVERY DAY  . traMADol (ULTRAM) 50 MG tablet tramadol 50 mg tablet  TAKE 1 TABLET BY MOUTH TWICE A DAY AS NEEDED   No facility-administered encounter medications on file as of 09/23/2018.     Allergy:  Allergies  Allergen Reactions  . Codeine Nausea And Vomiting  . Sulfa Antibiotics Nausea And Vomiting    Social Hx:   Social History   Socioeconomic History  . Marital status: Married    Spouse name: Richard  . Number of children: 3  . Years of education: Not on file  . Highest education level: Not on file  Occupational History  . Occupation: Non-working, applying disability  Social Needs  . Financial resource strain: Not on file  . Food insecurity    Worry: Not on file    Inability: Not on file  . Transportation needs    Medical: Not on file    Non-medical: Not on file  Tobacco Use  . Smoking status: Former Smoker    Packs/day: 1.00    Years: 10.00    Pack years: 10.00    Types: Cigarettes    Quit date: 12/05/1988    Years since quitting: 29.8  . Smokeless tobacco: Never Used  Substance and Sexual Activity  . Alcohol use: No  . Drug use: No  . Sexual activity: Yes    Partners: Male  Lifestyle  . Physical activity    Days per week: Not on file    Minutes per  session: Not on file  . Stress: Not on file  Relationships  . Social Herbalist on phone: Not on file    Gets together: Not on file    Attends religious service: Not on file    Active member of club or organization: Not on file    Attends meetings of clubs or organizations: Not on file    Relationship status: Not on file  . Intimate partner violence    Fear of current or ex partner: Not on file    Emotionally abused: Not on file    Physically abused: Not on file    Forced sexual activity: Not on file  Other Topics Concern  . Not on file  Social History Narrative   Taking care of her grandson who has autism, 67 years old    Past Surgical Hx:  Past Surgical History:  Procedure Laterality Date  .  CHOLECYSTECTOMY  2000  . COLECTOMY  2016  . colonscopy    . ROBOTIC ASSISTED TOTAL HYSTERECTOMY WITH BILATERAL SALPINGO OOPHERECTOMY Bilateral 01/18/2016   Procedure: XI ROBOTIC ASSISTED TOTAL HYSTERECTOMY WITH BILATERAL SALPINGO OOPHORECTOMY WITH SENTINAL LYMPH NODE BIOPSY; LYSIS OF ADHESIONS;  Surgeon: Everitt Amber, MD;  Location: [facility];  Service: Gynecology;  Laterality: Bilateral;  . TUBAL LIGATION  1991  . UPPER GI ENDOSCOPY  01/10/2016    Past Medical Hx:  Past Medical History:  Diagnosis Date  . Arthritis   . Complication of anesthesia 01/10/2016   when [place] feels like lump in throat had endo for bur feeling continues  . Depression   . Diverticulitis   . Endometrial cancer ([address])   . Essential hypertension   . 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 Family history of brain cancer   . Family history of breast cancer   . Family history of colon cancer   . GERD (gastroesophageal reflux disease)   . H. pylori infection   . Headache    migraines  . Heart palpitations   . History of hiatal hernia   . Hyperlipidemia   . IBS (irritable bowel syndrome)   . OSA (obstructive sleep apnea)    not used  last 3 weeks due to cold cpap  setting of 11  . Urinary incontinence     Past Gynecological History:  SVD x 3 No LMP recorded. Patient is postmenopausal.  Family Hx:  Family History  Problem Relation Age of Onset  . COPD Mother   . CAD Father   . Cancer Maternal Grandmother 80       breast ca  . Cancer Maternal Aunt        lung ca  . Cancer Maternal Uncle        brain ca  . Pulmonary embolism Sister   . Heart attack Brother   . COPD Brother   . Colon cancer Maternal Uncle   . Brain cancer Cousin        dx under 50    Review of Systems:  Constitutional  Feels well,    ENT Normal appearing ears and nares bilaterally Skin/Breast  No rash, sores, jaundice, itching, dryness Cardiovascular  No chest pain, shortness of breath, or edema  Pulmonary  No cough no wheeze.  Gastro Intestinal  No nausea, vomitting, or diarrhoea. No bright red blood per rectum, no abdominal pain, change in bowel movement, or constipation.  Genito Urinary  + frequency, + urgency, no dysuria, no bleeding Musculo Skeletal  + back pain (left lumbar) Neurologic  + radicular pain down left leg Psychology  No depression, anxiety, insomnia.   Vitals:  Blood pressure 123/83, pulse 78, temperature 98 F (36.7 C), temperature source Oral, resp. rate 18, height '5\' 3"'$  (1.6 m), weight 210 lb 12.8 oz (95.6 kg), SpO2 100 %.  Physical Exam: WD in NAD Neck  Supple NROM, without any enlargements.  Lymph Node Survey No cervical supraclavicular or inguinal adenopathy Cardiovascular  Pulse normal rate, regularity and rhythm. S1 and S2 normal.  Lungs  Clear to auscultation bilateraly, without wheezes/crackles/rhonchi. Good air movement.  Skin  No rash/lesions/breakdown  Psychiatry  Alert and oriented to person, place, and time  Abdomen  Normoactive bowel sounds, abdomen soft, non-tender and obese without evidence of hernia.  Back No CVA tenderness Genito Urinary: vagina normal, no lesions, no blood. No masses.  Rectal  deferred.  Extremities  No bilateral cyanosis, clubbing or edema.  Thereasa Solo, MD  09/23/2018, 2:19 PM

## 2018-10-02 ENCOUNTER — Encounter: Payer: Self-pay | Admitting: *Deleted

## 2018-10-06 ENCOUNTER — Encounter: Payer: Self-pay | Admitting: Cardiology

## 2018-10-06 ENCOUNTER — Other Ambulatory Visit: Payer: Self-pay

## 2018-10-06 ENCOUNTER — Ambulatory Visit (INDEPENDENT_AMBULATORY_CARE_PROVIDER_SITE_OTHER): Payer: Medicare Other | Admitting: Cardiology

## 2018-10-06 VITALS — BP 110/80 | HR 70 | Ht 63.0 in | Wt 213.0 lb

## 2018-10-06 DIAGNOSIS — R0789 Other chest pain: Secondary | ICD-10-CM | POA: Diagnosis not present

## 2018-10-06 DIAGNOSIS — I2510 Atherosclerotic heart disease of native coronary artery without angina pectoris: Secondary | ICD-10-CM | POA: Diagnosis not present

## 2018-10-06 DIAGNOSIS — E782 Mixed hyperlipidemia: Secondary | ICD-10-CM

## 2018-10-06 DIAGNOSIS — R079 Chest pain, unspecified: Secondary | ICD-10-CM

## 2018-10-06 NOTE — Progress Notes (Signed)
Cardiology Office Note:    Date:  10/06/2018   ID:  Allison Spencer, DOB 21-May-1960, MRN [id number]  PCP:  Imagene Riches, NP  Cardiologist:  Berniece Salines, DO  Electrophysiologist:  None   Referring MD: Imagene Riches, NP    The patient was referred by her pcp.   History of Present Illness:    Allison Spencer is a 58 y.o. female with a hx of hyperlipidemia, mild coronary artery disease from a cardiac catheterization performed in 2015 per patient, OSA stage IIIC1 grade 1 endometrioid endometrial cancer diagnosed in January, 2018 which was treated with cancer fighting food - no chemotherapy. The patient presents to be evaluated for this pain.  The patient reports for the last several weeks she has been experiencing intermittent left-sided chest pain.  She notes that the pain radiates to her arm as well as to her shoulder blade.  He admits to associated shortness of breath.  Denies palpitations, lightheadedness, nausea, vomiting.  Past Medical History:  Diagnosis Date  . Arthritis   . Complication of anesthesia 01/10/2016   when [place] feels like lump in throat had endo for bur feeling continues  . Depression   . Diverticulitis   . Endometrial cancer ([place])   . Essential hypertension   . 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 Family history of brain cancer   . Family history of breast cancer   . Family history of colon cancer   . GERD (gastroesophageal reflux disease)   . H. pylori infection   . Headache    migraines  . Heart palpitations   . History of hiatal hernia   . Hyperlipidemia   . IBS (irritable bowel syndrome)   . OSA (obstructive sleep apnea)    not used  last 3 weeks due to cold cpap setting of 11  . Urinary incontinence     Past Surgical History:  Procedure Laterality Date  . CHOLECYSTECTOMY  2000  . COLECTOMY  2016  . colonscopy    . ROBOTIC ASSISTED TOTAL HYSTERECTOMY WITH BILATERAL SALPINGO OOPHERECTOMY Bilateral  01/18/2016   Procedure: XI ROBOTIC ASSISTED TOTAL HYSTERECTOMY WITH BILATERAL SALPINGO OOPHORECTOMY WITH SENTINAL LYMPH NODE BIOPSY; LYSIS OF ADHESIONS;  Surgeon: Everitt Amber, MD;  Location: [facility];  Service: Gynecology;  Laterality: Bilateral;  . TUBAL LIGATION  1991  . UPPER GI ENDOSCOPY  01/10/2016    Current Medications: Current Meds  Medication Sig  . dicyclomine (BENTYL) 10 MG capsule Take 10 mg by mouth 2 (two) times daily as needed for spasms.   Marland Kitchen escitalopram (LEXAPRO) 20 MG tablet TAKE ONE AND ONE HALF (1.5) TABLETS BY MOUTH EVERY MORNING  . famotidine (PEPCID) 40 MG tablet TAKE 1 (ONE) TABLET TWICE DAILY, WITH BREAKFAST AND AT BEDTIME  . fluticasone (FLONASE) 50 MCG/ACT nasal spray INSTILL 2 SPRAYS INTO EACH NOSTRIL ONCE A DAY  . ketoconazole (NIZORAL) 2 % shampoo ketoconazole 2 % shampoo  APPLY TO WASH TWICE WEEKLY  . losartan (COZAAR) 25 MG tablet Take 25 mg by mouth daily.  Marland Kitchen nystatin cream (MYCOSTATIN)   . pantoprazole (PROTONIX) 40 MG tablet Take 40 mg by mouth 2 (two) times daily.   . pravastatin (PRAVACHOL) 20 MG tablet TAKE 1 TABLET BY MOUTH DAILY  . pregabalin (LYRICA) 150 MG capsule Take 150 mg by mouth 2 (two) times daily.  . solifenacin (VESICARE) 10 MG tablet Vesicare 10 mg tablet  TAKE 1 TABLET BY  MOUTH EVERY DAY  . traMADol (ULTRAM) 50 MG tablet tramadol 50 mg tablet  TAKE 1 TABLET BY MOUTH TWICE A DAY AS NEEDED     Allergies:   Codeine and Sulfa antibiotics   Social History   Socioeconomic History  . Marital status: Married    Spouse name: Richard  . Number of children: 3  . Years of education: Not on file  . Highest education level: Not on file  Occupational History  . Occupation: Non-working, applying disability  Social Needs  . Financial resource strain: Not on file  . Food insecurity    Worry: Not on file    Inability: Not on file  . Transportation needs    Medical: Not on file    Non-medical: Not on file  Tobacco Use  . Smoking status:  Former Smoker    Packs/day: 1.00    Years: 10.00    Pack years: 10.00    Types: Cigarettes    Quit date: 12/05/1988    Years since quitting: 29.8  . Smokeless tobacco: Never Used  Substance and Sexual Activity  . Alcohol use: No  . Drug use: No  . Sexual activity: Yes    Partners: Male  Lifestyle  . Physical activity    Days per week: Not on file    Minutes per session: Not on file  . Stress: Not on file  Relationships  . Social Herbalist on phone: Not on file    Gets together: Not on file    Attends religious service: Not on file    Active member of club or organization: Not on file    Attends meetings of clubs or organizations: Not on file    Relationship status: Not on file  Other Topics Concern  . Not on file  Social History Narrative   Taking care of her grandson who has autism, 37 years old     Family History: The patient's family history includes Brain cancer in her cousin; CAD in her father; COPD in her brother and mother; Cancer in her maternal aunt and maternal uncle; Cancer (age of onset: 58) in her maternal grandmother; Colon cancer in her maternal uncle; Heart attack in her brother; Pulmonary embolism in her sister.  ROS:   Review of Systems  Constitution: Negative for decreased appetite, fever and weight gain.  HENT: Negative for congestion, ear discharge, hoarse voice and sore throat.   Eyes: Negative for discharge, redness, vision loss in right eye and visual halos.  Cardiovascular: Negative for chest pain, dyspnea on exertion, leg swelling, orthopnea and palpitations.  Respiratory: Negative for cough, hemoptysis, shortness of breath and snoring.   Endocrine: Negative for heat intolerance and polyphagia.  Hematologic/Lymphatic: Negative for bleeding problem. Does not bruise/bleed easily.  Skin: Negative for flushing, nail changes, rash and suspicious lesions.  Musculoskeletal: Negative for arthritis, joint pain, muscle cramps, myalgias, neck pain  and stiffness.  Gastrointestinal: Negative for abdominal pain, bowel incontinence, diarrhea and excessive appetite.  Genitourinary: Negative for decreased libido, genital sores and incomplete emptying.  Neurological: Negative for brief paralysis, focal weakness, headaches and loss of balance.  Psychiatric/Behavioral: Negative for altered mental status, depression and suicidal ideas.  Allergic/Immunologic: Negative for HIV exposure and persistent infections.    EKGs/Labs/Other Studies Reviewed:    The following studies were reviewed today:   EKG:  The ekg ordered today demonstrates sinus rhythm, heart rate 87 bpm  Recent Labs: No results found for requested labs within last 8760 hours.  Recent Lipid Panel No results found for: CHOL, TRIG, HDL, CHOLHDL, VLDL, LDLCALC, LDLDIRECT  Physical Exam:    VS:  BP 110/80 (BP Location: Left Arm, Patient Position: Sitting, Cuff Size: Large)   Pulse 70   Ht 5\' 3"  (1.6 m)   Wt 213 lb (96.6 kg)   SpO2 97%   BMI 37.73 kg/m     Wt Readings from Last 3 Encounters:  10/06/18 213 lb (96.6 kg)  09/23/18 210 lb 12.8 oz (95.6 kg)  02/05/18 198 lb (89.8 kg)     GEN: Patient is obese, well nourished, well developed in no acute distress HEENT: Normal NECK: No JVD; No carotid bruits LYMPHATICS: No lymphadenopathy CARDIAC: S1S2 noted,RRR, no murmurs, rubs, gallops RESPIRATORY:  Clear to auscultation without rales, wheezing or rhonchi  ABDOMEN: Soft, non-tender, non-distended, +bowel sounds, no guarding. EXTREMITIES: No edema, No cyanosis, no clubbing MUSCULOSKELETAL:  No edema; No deformity  SKIN: Warm and dry NEUROLOGIC:  Alert and oriented x 3, non-focal PSYCHIATRIC:  Normal affect, good insight  ASSESSMENT:    1. Coronary artery disease involving native coronary artery of native heart without angina pectoris   2. Mixed hyperlipidemia   3. Chest pain of uncertain etiology   4. Morbid obesity ([place])   5. Other chest pain    PLAN:    1.   The patient does have normal coronary artery disease and is presenting with a symptoms of chest pain.  At this time is appropriate to pursue an ischemic evaluation.  A pharmacologic nuclear stress test will be appropriate.  He was educated on this test.  She is in agreement to undergo this test.  In addition, sublingual nitroglycerin prescription was sent, its protocol and 911 protocol explained and the patient vocalized understanding questions were answered to the patient's satisfaction.  She will also be started on aspirin 81 mg daily given her coronary artery disease.  She is currently not on this medication and denies any bleeding in the past.  2.  A transthoracic echocardiogram has been ordered to assess LV function, RV function and any diastolic dysfunction given her history of hypertension.  3.  Her diastolic blood pressure is elevated in the office today.  At this time she reports that she has been recently eating a moderate amount of pink salt.  Asked patient to decrease her intake of salt.  She was advised to take her blood pressure daily and she will follow-up with me in 1 month.  4.  The patient understands the need to lose weight with diet and exercise. We have discussed specific strategies for this.  The patient is in agreement with the above plan. The patient left the office in stable condition.  The patient will follow up in 1 month.   Medication Adjustments/Labs and Tests Ordered: Current medicines are reviewed at length with the patient today.  Concerns regarding medicines are outlined above.  Orders Placed This Encounter  Procedures  . Basic Metabolic Panel (BMET)  . Magnesium  . CBC  . EKG 12-Lead   No orders of the defined types were placed in this encounter.   Patient Instructions  Medication Instructions:  Your physician has recommended you make the following change in your medication:  START taking aspirin 81 mg (1 tablet) once daily  START taking nitroglycerin as  needed for chest pain. When having chest pain, stop what you are doing and sit down. Take 1 nitro, wait 5 minutes. Still having chest pain, take 1 nitro, wait 5 minutes.  Still having chest pain, take 1 nitro, dial 911. Total of 3 nitro in 15 minutes.  If you need a refill on your cardiac medications before your next appointment, please call your pharmacy.   Lab work: Your physician recommends that you have a BMP, mag and CBC drawn today.   YOU will need to come back FASTING for a lipid panel to be drawn.   If you have labs (blood work) drawn today and your tests are completely normal, you will receive your results only by: Marland Kitchen MyChart Message (if you have MyChart) OR . A paper copy in the mail If you have any lab test that is abnormal or we need to change your treatment, we will call you to review the results.  Testing/Procedures: You had an EKG Performed today  Your physician has requested that you have an echocardiogram. Echocardiography is a painless test that uses sound waves to create images of your heart. It provides your doctor with information about the size and shape of your heart and how well your heart's chambers and valves are working. This procedure takes approximately one hour. There are no restrictions for this procedure.  Your physician has requested that you have a lexiscan myoview. For further information please visit [email or web address]. Please follow instruction sheet, as given.   Follow-Up: At [facility], you and your health needs are our priority.  As part of our continuing mission to provide you with exceptional heart care, we have created designated Provider Care Teams.  These Care Teams include your primary Cardiologist (physician) and Advanced Practice Providers (APPs -  Physician Assistants and Nurse Practitioners) who all work together to provide you with the care you need, when you need it. . You will need a follow up appointment in 1 months.   Godfrey Pick Gizella [place],  DO  Any Other Special Instructions Will Be Listed Below   Aspirin and Your Heart  Aspirin is a medicine that prevents the cells in the blood that are used for clotting, called platelets, from sticking together. Aspirin can be used to help reduce the risk of blood clots, heart attacks, and other heart-related problems. Can I take aspirin? Your health care provider will help you determine whether it is safe and beneficial for you to take aspirin daily. Taking aspirin daily may be helpful if you:  Have had a heart attack or chest pain.  Are at risk for a heart attack.  Have undergone open-heart surgery, such as coronary artery bypass surgery (CABG).  Have had coronary angioplasty or a stent.  Have had certain types of stroke or transient ischemic attack (TIA).  Have peripheral artery disease (PAD).  Have chronic heart rhythm problems such as atrial fibrillation and cannot take an anticoagulant.  Have valve disease or have had surgery on a valve. What are the risks? Daily use of aspirin can cause side effects. Some of these include:  Bleeding. Bleeding problems can be minor or serious. An example of a minor problem is a cut that does not stop bleeding. An example of a more serious problem is stomach bleeding or, rarely, bleeding into the brain. Your risk of bleeding is increased if you are also taking non-steroidal anti-inflammatory drugs (NSAIDs).  Increased bruising.  Upset stomach.  An allergic reaction. People who have nasal polyps have an increased risk of developing an aspirin allergy. General guidelines  Take aspirin only as told by your health care provider. Make sure that you understand how much you should take and what form  you should take. The two forms of aspirin are: ? Non-enteric-coated.This type of aspirin does not have a coating and is absorbed quickly. This type of aspirin also comes in a chewable form. ? Enteric-coated. This type of aspirin has a coating that  releases the medicine very slowly. Enteric-coated aspirin might cause less stomach upset than non-enteric-coated aspirin. This type of aspirin should not be chewed or crushed.  Limit alcohol intake to no more than 1 drink a day for nonpregnant women and 2 drinks a day for men. Drinking alcohol increases your risk of bleeding. One drink equals 12 oz of beer, 5 oz of wine, or 1 oz of hard liquor. Contact a health care provider if you:  Have unusual bleeding or bruising.  Have stomach pain or nausea.  Have ringing in your ears.  Have an allergic reaction that causes: ? Hives. ? Itchy skin. ? Swelling of the lips, tongue, or face. Get help right away if you:  Notice that your bowel movements are bloody, dark red, or black in color.  Vomit or cough up blood.  Have blood in your urine.  Cough, have noisy breathing (wheeze), or feel short of breath.  Have chest pain, especially if the pain spreads to the arms, back, neck, or jaw.  Have a severe headache, or a headache with confusion, or dizziness. These symptoms may represent a serious problem that is an emergency. Do not wait to see if the symptoms will go away. Get medical help right away. Call your local emergency services (911 in the [place] Do not drive yourself to the hospital. Summary  Aspirin can be used to help reduce the risk of blood clots, heart attacks, and other heart-related problems.  Daily use of aspirin can increase your risk of side effects. Your health care provider will help you determine whether it is safe and beneficial for you to take aspirin daily.  Take aspirin only as told by your health care provider. Make sure that you understand how much you can take and what form you can take. This information is not intended to replace advice given to you by your health care provider. Make sure you discuss any questions you have with your health care provider. Document Released: 12/07/2007 Document Revised: 10/24/2016  Document Reviewed: 10/24/2016 Elsevier Patient Education  2020 [place]. Nitroglycerin sublingual tablets What is this medicine? NITROGLYCERIN (nye troe GLI ser in) is a type of vasodilator. It relaxes blood vessels, increasing the blood and oxygen supply to your heart. This medicine is used to relieve chest pain caused by angina. It is also used to prevent chest pain before activities like climbing stairs, going outdoors in cold weather, or sexual activity. This medicine may be used for other purposes; ask your health care provider or pharmacist if you have questions. COMMON BRAND NAME(S): Nitroquick, Nitrostat, Nitrotab What should I tell my health care provider before I take this medicine? They need to know if you have any of these conditions:  anemia  head injury, recent stroke, or bleeding in the brain  liver disease  previous heart attack  an unusual or allergic reaction to nitroglycerin, other medicines, foods, dyes, or preservatives  pregnant or trying to get pregnant  breast-feeding How should I use this medicine? Take this medicine by mouth as needed. At the first sign of an angina attack (chest pain or tightness) place one tablet under your tongue. You can also take this medicine 5 to 10 minutes before an event likely to produce  chest pain. Follow the directions on the prescription label. Let the tablet dissolve under the tongue. Do not swallow whole. Replace the dose if you accidentally swallow it. It will help if your mouth is not dry. Saliva around the tablet will help it to dissolve more quickly. Do not eat or drink, smoke or chew tobacco while a tablet is dissolving. If you are not better within 5 minutes after taking ONE dose of nitroglycerin, call 9-1-1 immediately to seek emergency medical care. Do not take more than 3 nitroglycerin tablets over 15 minutes. If you take this medicine often to relieve symptoms of angina, your doctor or health care professional may  provide you with different instructions to manage your symptoms. If symptoms do not go away after following these instructions, it is important to call 9-1-1 immediately. Do not take more than 3 nitroglycerin tablets over 15 minutes. Talk to your pediatrician regarding the use of this medicine in children. Special care may be needed. Overdosage: If you think you have taken too much of this medicine contact a poison control center or emergency room at once. NOTE: This medicine is only for you. Do not share this medicine with others. What if I miss a dose? This does not apply. This medicine is only used as needed. What may interact with this medicine? Do not take this medicine with any of the following medications:  certain migraine medicines like ergotamine and dihydroergotamine (DHE)  medicines used to treat erectile dysfunction like sildenafil, tadalafil, and vardenafil  riociguat This medicine may also interact with the following medications:  alteplase  aspirin  heparin  medicines for high blood pressure  medicines for mental depression  other medicines used to treat angina  phenothiazines like chlorpromazine, mesoridazine, prochlorperazine, thioridazine This list may not describe all possible interactions. Give your health care provider a list of all the medicines, herbs, non-prescription drugs, or dietary supplements you use. Also tell them if you smoke, drink alcohol, or use illegal drugs. Some items may interact with your medicine. What should I watch for while using this medicine? Tell your doctor or health care professional if you feel your medicine is no longer working. Keep this medicine with you at all times. Sit or lie down when you take your medicine to prevent falling if you feel dizzy or faint after using it. Try to remain calm. This will help you to feel better faster. If you feel dizzy, take several deep breaths and lie down with your feet propped up, or bend forward  with your head resting between your knees. You may get drowsy or dizzy. Do not drive, use machinery, or do anything that needs mental alertness until you know how this drug affects you. Do not stand or sit up quickly, especially if you are an older patient. This reduces the risk of dizzy or fainting spells. Alcohol can make you more drowsy and dizzy. Avoid alcoholic drinks. Do not treat yourself for coughs, colds, or pain while you are taking this medicine without asking your doctor or health care professional for advice. Some ingredients may increase your blood pressure. What side effects may I notice from receiving this medicine? Side effects that you should report to your doctor or health care professional as soon as possible:  blurred vision  dry mouth  skin rash  sweating  the feeling of extreme pressure in the head  unusually weak or tired Side effects that usually do not require medical attention (report to your doctor or health care  professional if they continue or are bothersome):  flushing of the face or neck  headache  irregular heartbeat, palpitations  nausea, vomiting This list may not describe all possible side effects. Call your doctor for medical advice about side effects. You may report side effects to FDA at 1-800-FDA-1088. Where should I keep my medicine? Keep out of the reach of children. Store at room temperature between 20 and 25 degrees C (68 and 77 degrees F). Store in [facility] [employer]. Protect from light and moisture. Keep tightly closed. Throw away any unused medicine after the expiration date. NOTE: This sheet is a summary. It may not cover all possible information. If you have questions about this medicine, talk to your doctor, pharmacist, or health care provider.  2020 Elsevier/Gold Standard (2012-10-22 17:57:36)  Echocardiogram An echocardiogram is a procedure that uses painless sound waves (ultrasound) to produce an image of the heart. Images from  an echocardiogram can provide important information about:  Signs of coronary artery disease (CAD).  Aneurysm detection. An aneurysm is a weak or damaged part of an artery wall that bulges out from the normal force of blood pumping through the body.  Heart size and shape. Changes in the size or shape of the heart can be associated with certain conditions, including heart failure, aneurysm, and CAD.  Heart muscle function.  Heart valve function.  Signs of a past heart attack.  Fluid buildup around the heart.  Thickening of the heart muscle.  A tumor or infectious growth around the heart valves. Tell a health care provider about:  Any allergies you have.  All medicines you are taking, including vitamins, herbs, eye drops, creams, and over-the-counter medicines.  Any blood disorders you have.  Any surgeries you have had.  Any medical conditions you have.  Whether you are pregnant or may be pregnant. What are the risks? Generally, this is a safe procedure. However, problems may occur, including:  Allergic reaction to dye (contrast) that may be used during the procedure. What happens before the procedure? No specific preparation is needed. You may eat and drink normally. What happens during the procedure?   An IV tube may be inserted into one of your veins.  You may receive contrast through this tube. A contrast is an injection that improves the quality of the pictures from your heart.  A gel will be applied to your chest.  A wand-like tool (transducer) will be moved over your chest. The gel will help to transmit the sound waves from the transducer.  The sound waves will harmlessly bounce off of your heart to allow the heart images to be captured in real-time motion. The images will be recorded on a computer. The procedure may vary among health care providers and hospitals. What happens after the procedure?  You may return to your normal, everyday life, including diet,  activities, and medicines, unless your health care provider tells you not to do that. Summary  An echocardiogram is a procedure that uses painless sound waves (ultrasound) to produce an image of the heart.  Images from an echocardiogram can provide important information about the size and shape of your heart, heart muscle function, heart valve function, and fluid buildup around your heart.  You do not need to do anything to prepare before this procedure. You may eat and drink normally.  After the echocardiogram is completed, you may return to your normal, everyday life, unless your health care provider tells you not to do that. This information is not  intended to replace advice given to you by your health care provider. Make sure you discuss any questions you have with your health care provider. Document Released: 12/22/1999 Document Revised: 04/16/2018 Document Reviewed: 01/27/2016 Elsevier Patient Education  [place] injection What is this medicine? REGADENOSON is used to test the heart for coronary artery disease. It is used in patients who can not exercise for their stress test. This medicine may be used for other purposes; ask your health care provider or pharmacist if you have questions. COMMON BRAND NAME(S): Lexiscan What should I tell my health care provider before I take this medicine? They need to know if you have any of these conditions:  heart problems  lung or breathing disease, like asthma or COPD  an unusual or allergic reaction to regadenoson, other medicines, foods, dyes, or preservatives  pregnant or trying to get pregnant  breast-feeding How should I use this medicine? This medicine is for injection into a vein. It is given by a health care professional in a hospital or clinic setting. Talk to your pediatrician regarding the use of this medicine in children. Special care may be needed. Overdosage: If you think you have taken too much of  this medicine contact a poison control center or emergency room at once. NOTE: This medicine is only for you. Do not share this medicine with others. What if I miss a dose? This does not apply. What may interact with this medicine?  caffeine  dipyridamole  guarana  theophylline This list may not describe all possible interactions. Give your health care provider a list of all the medicines, herbs, non-prescription drugs, or dietary supplements you use. Also tell them if you smoke, drink alcohol, or use illegal drugs. Some items may interact with your medicine. What should I watch for while using this medicine? Your condition will be monitored carefully while you are receiving this medicine. Do not take medicines, foods, or drinks with caffeine (like coffee, tea, or colas) for at least 12 hours before your test. If you do not know if something contains caffeine, ask your health care professional. What side effects may I notice from receiving this medicine? Side effects that you should report to your doctor or health care professional as soon as possible:  allergic reactions like skin rash, itching or hives, swelling of the face, lips, or tongue  breathing problems  chest pain, tightness or palpitations  severe headache Side effects that usually do not require medical attention (report to your doctor or health care professional if they continue or are bothersome):  flushing  headache  irritation or pain at site where injected  nausea, vomiting This list may not describe all possible side effects. Call your doctor for medical advice about side effects. You may report side effects to FDA at 1-800-FDA-1088. Where should I keep my medicine? This drug is given in a hospital or clinic and will not be stored at home. NOTE: This sheet is a summary. It may not cover all possible information. If you have questions about this medicine, talk to your doctor, pharmacist, or health care  provider.  2020 Elsevier/Gold Standard (2007-08-24 15:08:13)  Cardiac Nuclear Scan A cardiac nuclear scan is a test that is done to check the flow of blood to your heart. It is done when you are resting and when you are exercising. The test looks for problems such as:  Not enough blood reaching a portion of the heart.  The heart muscle not working as  it should. You may need this test if:  You have heart disease.  You have had lab results that are not normal.  You have had heart surgery or a balloon procedure to open up blocked arteries (angioplasty).  You have chest pain.  You have shortness of breath. In this test, a special dye (tracer) is put into your bloodstream. The tracer will travel to your heart. A camera will then take pictures of your heart to see how the tracer moves through your heart. This test is usually done at a hospital and takes 2-4 hours. Tell a doctor about:  Any allergies you have.  All medicines you are taking, including vitamins, herbs, eye drops, creams, and over-the-counter medicines.  Any problems you or family members have had with anesthetic medicines.  Any blood disorders you have.  Any surgeries you have had.  Any medical conditions you have.  Whether you are pregnant or may be pregnant. What are the risks? Generally, this is a safe test. However, problems may occur, such as:  Serious chest pain and heart attack. This is only a risk if the stress portion of the test is done.  Rapid heartbeat.  A feeling of warmth in your chest. This feeling usually does not last long.  Allergic reaction to the tracer. What happens before the test?  Ask your doctor about changing or stopping your normal medicines. This is important.  Follow instructions from your doctor about what you cannot eat or drink.  Remove your jewelry on the day of the test. What happens during the test?  An IV tube will be inserted into one of your veins.  Your doctor  will give you a small amount of tracer through the IV tube.  You will wait for 20-40 minutes while the tracer moves through your bloodstream.  Your heart will be monitored with an electrocardiogram (ECG).  You will lie down on an exam table.  Pictures of your heart will be taken for about 15-20 minutes.  You may also have a stress test. For this test, one of these things may be done: ? You will be asked to exercise on a treadmill or a stationary bike. ? You will be given medicines that will make your heart work harder. This is done if you are unable to exercise.  When blood flow to your heart has peaked, a tracer will again be given through the IV tube.  After 20-40 minutes, you will get back on the exam table. More pictures will be taken of your heart.  Depending on the tracer that is used, more pictures may need to be taken 3-4 hours later.  Your IV tube will be removed when the test is over. The test may vary among doctors and hospitals. What happens after the test?  Ask your doctor: ? Whether you can return to your normal schedule, including diet, activities, and medicines. ? Whether you should drink more fluids. This will help to remove the tracer from your body. Drink enough fluid to keep your pee (urine) pale yellow.  Ask your doctor, or the department that is doing the test: ? When will my results be ready? ? How will I get my results? Summary  A cardiac nuclear scan is a test that is done to check the flow of blood to your heart.  Tell your doctor whether you are pregnant or may be pregnant.  Before the test, ask your doctor about changing or stopping your normal medicines. This is important.  Ask your doctor whether you can return to your normal activities. You may be asked to drink more fluids. This information is not intended to replace advice given to you by your health care provider. Make sure you discuss any questions you have with your health care provider.  Document Released: 06/09/2017 Document Revised: 04/15/2018 Document Reviewed: 06/09/2017 Elsevier Patient Education  2020 [place]  Tips to measure your blood pressure correctly  To determine whether you have hypertension, a medical professional will take a blood pressure reading. How you prepare for the test, the position of your arm, and other factors can change a blood pressure reading by 10% or more. That could be enough to hide high blood pressure, start you on a drug you don't really need, or lead your doctor to incorrectly adjust your medications. National and international guidelines offer specific instructions for measuring blood pressure. If a doctor, nurse, or medical assistant isn't doing it right, don't hesitate to ask him or her to get with the guidelines. Here's what you can do to ensure a correct reading: . Don't drink a caffeinated beverage or smoke during the 30 minutes before the test. . Sit quietly for five minutes before the test begins. . During the measurement, sit in a chair with your feet on the floor and your arm supported so your elbow is at about heart level. . The inflatable part of the cuff should completely cover at least 80% of your upper arm, and the cuff should be placed on bare skin, not over a shirt. . Don't talk during the measurement. . Have your blood pressure measured twice, with a brief break in between. If the readings are different by 5 points or more, have it done a third time. There are times to break these rules. If you sometimes feel lightheaded when getting out of bed in the morning or when you stand after sitting, you should have your blood pressure checked while seated and then while standing to see if it falls from one position to the next. Because blood pressure varies throughout the day, your doctor will rarely diagnose hypertension on the basis of a single reading. Instead, he or she will want to confirm the measurements on  at least two occasions, usually within a few weeks of one another. The exception to this rule is if you have a blood pressure reading of 180/110 mm Hg or higher. A result this high usually calls for prompt treatment. It's also a good idea to have your blood pressure measured in both arms at least once, since the reading in one arm (usually the right) may be higher than that in the left. A 2014 study in The American Journal of Medicine of nearly 3,400 people found average arm- to-arm differences in systolic blood pressure of about 5 points. The higher number should be used to make treatment decisions. In 2017, new guidelines from the [place], the [employer] of [employer], and nine other health organizations lowered the diagnosis of high blood pressure to 130/80 mm Hg or higher for all adults. The guidelines also redefined the various blood pressure categories to now include normal, elevated, Stage 1 hypertension, Stage 2 hypertension, and hypertensive crisis (see "Blood pressure categories"). Blood pressure categories  Blood pressure category SYSTOLIC (upper number)  DIASTOLIC (lower number)  Normal Less than 120 mm Hg and Less than 80 mm Hg  Elevated 120-129 mm Hg and Less than 80 mm Hg  High blood pressure:  Stage 1 hypertension 130-139 mm Hg or 80-89 mm Hg  High blood pressure: Stage 2 hypertension 140 mm Hg or higher or 90 mm Hg or higher  Hypertensive crisis (consult your doctor immediately) Higher than 180 mm Hg and/or Higher than 120 mm Hg  Source: American Heart Association and American Stroke Association. For more on getting your blood pressure under control, buy Controlling Your Blood Pressure, a Special Health Report from [facility].   Adopting a Healthy Lifestyle.  Know what a healthy weight is for you (roughly BMI <25) and aim to maintain this   Aim for 7+ servings of fruits and vegetables daily   65-80+ fluid ounces of water or unsweet tea for  healthy kidneys   Limit to max 1 drink of alcohol per day; avoid smoking/tobacco   Limit animal fats in diet for cholesterol and heart health - choose grass fed whenever available   Avoid highly processed foods, and foods high in saturated/trans fats   Aim for low stress - take time to unwind and care for your mental health   Aim for 150 min of moderate intensity exercise weekly for heart health, and weights twice weekly for bone health   Aim for 7-9 hours of sleep daily   When it comes to diets, agreement about the perfect plan isnt easy to find, even among the experts. Experts at the [place] developed an idea known as the Healthy Eating Plate. Just imagine a plate divided into logical, healthy portions.   The emphasis is on diet quality:   Load up on vegetables and fruits - one-half of your plate: Aim for color and variety, and remember that potatoes dont count.   Go for whole grains - one-quarter of your plate: Whole wheat, barley, wheat berries, quinoa, oats, brown rice, and foods made with them. If you want pasta, go with whole wheat pasta.   Protein power - one-quarter of your plate: Fish, chicken, beans, and nuts are all healthy, versatile protein sources. Limit red meat.   The diet, however, does go beyond the plate, offering a few other suggestions.   Use healthy plant oils, such as olive, canola, soy, corn, sunflower and peanut. Check the labels, and avoid partially hydrogenated oil, which have unhealthy trans fats.   If youre thirsty, drink water. Coffee and tea are good in moderation, but skip sugary drinks and limit milk and dairy products to one or two daily servings.   The type of carbohydrate in the diet is more important than the amount. Some sources of carbohydrates, such as vegetables, fruits, whole grains, and beans-are healthier than others.   Finally, stay active  Signed, Berniece Salines, DO  10/06/2018 10:50 AM    [place]

## 2018-10-06 NOTE — Patient Instructions (Signed)
Medication Instructions:  Your physician has recommended you make the following change in your medication:  START taking aspirin 81 mg (1 tablet) once daily  START taking nitroglycerin as needed for chest pain. When having chest pain, stop what you are doing and sit down. Take 1 nitro, wait 5 minutes. Still having chest pain, take 1 nitro, wait 5 minutes. Still having chest pain, take 1 nitro, dial 911. Total of 3 nitro in 15 minutes.  If you need a refill on your cardiac medications before your next appointment, please call your pharmacy.   Lab work: Your physician recommends that you have a BMP, mag and CBC drawn today.   YOU will need to come back FASTING for a lipid panel to be drawn.   If you have labs (blood work) drawn today and your tests are completely normal, you will receive your results only by: Marland Kitchen MyChart Message (if you have MyChart) OR . A paper copy in the mail If you have any lab test that is abnormal or we need to change your treatment, we will call you to review the results.  Testing/Procedures: You had an EKG Performed today  Your physician has requested that you have an echocardiogram. Echocardiography is a painless test that uses sound waves to create images of your heart. It provides your doctor with information about the size and shape of your heart and how well your heart's chambers and valves are working. This procedure takes approximately one hour. There are no restrictions for this procedure.  Your physician has requested that you have a lexiscan myoview. For further information please visit [email or web address]. Please follow instruction sheet, as given.   Follow-Up: At [facility], you and your health needs are our priority.  As part of our continuing mission to provide you with exceptional heart care, we have created designated Provider Care Teams.  These Care Teams include your primary Cardiologist (physician) and Advanced Practice Providers (APPs -   Physician Assistants and Nurse Practitioners) who all work together to provide you with the care you need, when you need it. . You will need a follow up appointment in 1 months.   Allison Pick Tobb, DO  Any Other Special Instructions Will Be Listed Below   Aspirin and Your Heart  Aspirin is a medicine that prevents the cells in the blood that are used for clotting, called platelets, from sticking together. Aspirin can be used to help reduce the risk of blood clots, heart attacks, and other heart-related problems. Can I take aspirin? Your health care provider will help you determine whether it is safe and beneficial for you to take aspirin daily. Taking aspirin daily may be helpful if you:  Have had a heart attack or chest pain.  Are at risk for a heart attack.  Have undergone open-heart surgery, such as coronary artery bypass surgery (CABG).  Have had coronary angioplasty or a stent.  Have had certain types of stroke or transient ischemic attack (TIA).  Have peripheral artery disease (PAD).  Have chronic heart rhythm problems such as atrial fibrillation and cannot take an anticoagulant.  Have valve disease or have had surgery on a valve. What are the risks? Daily use of aspirin can cause side effects. Some of these include:  Bleeding. Bleeding problems can be minor or serious. An example of a minor problem is a cut that does not stop bleeding. An example of a more serious problem is stomach bleeding or, rarely, bleeding into the brain. Your risk of  bleeding is increased if you are also taking non-steroidal anti-inflammatory drugs (NSAIDs).  Increased bruising.  Upset stomach.  An allergic reaction. People who have nasal polyps have an increased risk of developing an aspirin allergy. General guidelines  Take aspirin only as told by your health care provider. Make sure that you understand how much you should take and what form you should take. The two forms of aspirin are: ?  Non-enteric-coated.This type of aspirin does not have a coating and is absorbed quickly. This type of aspirin also comes in a chewable form. ? Enteric-coated. This type of aspirin has a coating that releases the medicine very slowly. Enteric-coated aspirin might cause less stomach upset than non-enteric-coated aspirin. This type of aspirin should not be chewed or crushed.  Limit alcohol intake to no more than 1 drink a day for nonpregnant women and 2 drinks a day for men. Drinking alcohol increases your risk of bleeding. One drink equals 12 oz of beer, 5 oz of wine, or 1 oz of hard liquor. Contact a health care provider if you:  Have unusual bleeding or bruising.  Have stomach pain or nausea.  Have ringing in your ears.  Have an allergic reaction that causes: ? Hives. ? Itchy skin. ? Swelling of the lips, tongue, or face. Get help right away if you:  Notice that your bowel movements are bloody, dark red, or black in color.  Vomit or cough up blood.  Have blood in your urine.  Cough, have noisy breathing (wheeze), or feel short of breath.  Have chest pain, especially if the pain spreads to the arms, back, neck, or jaw.  Have a severe headache, or a headache with confusion, or dizziness. These symptoms may represent a serious problem that is an emergency. Do not wait to see if the symptoms will go away. Get medical help right away. Call your local emergency services (911 in the [place] Do not drive yourself to the hospital. Summary  Aspirin can be used to help reduce the risk of blood clots, heart attacks, and other heart-related problems.  Daily use of aspirin can increase your risk of side effects. Your health care provider will help you determine whether it is safe and beneficial for you to take aspirin daily.  Take aspirin only as told by your health care provider. Make sure that you understand how much you can take and what form you can take. This information is not intended  to replace advice given to you by your health care provider. Make sure you discuss any questions you have with your health care provider. Document Released: 12/07/2007 Document Revised: 10/24/2016 Document Reviewed: 10/24/2016 Elsevier Patient Education  2020 [employer]. Nitroglycerin sublingual tablets What is this medicine? NITROGLYCERIN (nye troe GLI ser in) is a type of vasodilator. It relaxes blood vessels, increasing the blood and oxygen supply to your heart. This medicine is used to relieve chest pain caused by angina. It is also used to prevent chest pain before activities like climbing stairs, going outdoors in cold weather, or sexual activity. This medicine may be used for other purposes; ask your health care provider or pharmacist if you have questions. COMMON BRAND NAME(S): Nitroquick, Nitrostat, Nitrotab What should I tell my health care provider before I take this medicine? They need to know if you have any of these conditions:  anemia  head injury, recent stroke, or bleeding in the brain  liver disease  previous heart attack  an unusual or allergic reaction to nitroglycerin,  other medicines, foods, dyes, or preservatives  pregnant or trying to get pregnant  breast-feeding How should I use this medicine? Take this medicine by mouth as needed. At the first sign of an angina attack (chest pain or tightness) place one tablet under your tongue. You can also take this medicine 5 to 10 minutes before an event likely to produce chest pain. Follow the directions on the prescription label. Let the tablet dissolve under the tongue. Do not swallow whole. Replace the dose if you accidentally swallow it. It will help if your mouth is not dry. Saliva around the tablet will help it to dissolve more quickly. Do not eat or drink, smoke or chew tobacco while a tablet is dissolving. If you are not better within 5 minutes after taking ONE dose of nitroglycerin, call 9-1-1 immediately to seek  emergency medical care. Do not take more than 3 nitroglycerin tablets over 15 minutes. If you take this medicine often to relieve symptoms of angina, your doctor or health care professional may provide you with different instructions to manage your symptoms. If symptoms do not go away after following these instructions, it is important to call 9-1-1 immediately. Do not take more than 3 nitroglycerin tablets over 15 minutes. Talk to your pediatrician regarding the use of this medicine in children. Special care may be needed. Overdosage: If you think you have taken too much of this medicine contact a poison control center or emergency room at once. NOTE: This medicine is only for you. Do not share this medicine with others. What if I miss a dose? This does not apply. This medicine is only used as needed. What may interact with this medicine? Do not take this medicine with any of the following medications:  certain migraine medicines like ergotamine and dihydroergotamine (DHE)  medicines used to treat erectile dysfunction like sildenafil, tadalafil, and vardenafil  riociguat This medicine may also interact with the following medications:  alteplase  aspirin  heparin  medicines for high blood pressure  medicines for mental depression  other medicines used to treat angina  phenothiazines like chlorpromazine, mesoridazine, prochlorperazine, thioridazine This list may not describe all possible interactions. Give your health care provider a list of all the medicines, herbs, non-prescription drugs, or dietary supplements you use. Also tell them if you smoke, drink alcohol, or use illegal drugs. Some items may interact with your medicine. What should I watch for while using this medicine? Tell your doctor or health care professional if you feel your medicine is no longer working. Keep this medicine with you at all times. Sit or lie down when you take your medicine to prevent falling if you  feel dizzy or faint after using it. Try to remain calm. This will help you to feel better faster. If you feel dizzy, take several deep breaths and lie down with your feet propped up, or bend forward with your head resting between your knees. You may get drowsy or dizzy. Do not drive, use machinery, or do anything that needs mental alertness until you know how this drug affects you. Do not stand or sit up quickly, especially if you are an older patient. This reduces the risk of dizzy or fainting spells. Alcohol can make you more drowsy and dizzy. Avoid alcoholic drinks. Do not treat yourself for coughs, colds, or pain while you are taking this medicine without asking your doctor or health care professional for advice. Some ingredients may increase your blood pressure. What side effects may I notice  from receiving this medicine? Side effects that you should report to your doctor or health care professional as soon as possible:  blurred vision  dry mouth  skin rash  sweating  the feeling of extreme pressure in the head  unusually weak or tired Side effects that usually do not require medical attention (report to your doctor or health care professional if they continue or are bothersome):  flushing of the face or neck  headache  irregular heartbeat, palpitations  nausea, vomiting This list may not describe all possible side effects. Call your doctor for medical advice about side effects. You may report side effects to FDA at 1-800-FDA-1088. Where should I keep my medicine? Keep out of the reach of children. Store at room temperature between 20 and 25 degrees C (68 and 77 degrees F). Store in [employer]. Protect from light and moisture. Keep tightly closed. Throw away any unused medicine after the expiration date. NOTE: This sheet is a summary. It may not cover all possible information. If you have questions about this medicine, talk to your doctor, pharmacist, or health care  provider.  2020 Elsevier/Gold Standard (2012-10-22 17:57:36)  Echocardiogram An echocardiogram is a procedure that uses painless sound waves (ultrasound) to produce an image of the heart. Images from an echocardiogram can provide important information about:  Signs of coronary artery disease (CAD).  Aneurysm detection. An aneurysm is a weak or damaged part of an artery wall that bulges out from the normal force of blood pumping through the body.  Heart size and shape. Changes in the size or shape of the heart can be associated with certain conditions, including heart failure, aneurysm, and CAD.  Heart muscle function.  Heart valve function.  Signs of a past heart attack.  Fluid buildup around the heart.  Thickening of the heart muscle.  A tumor or infectious growth around the heart valves. Tell a health care provider about:  Any allergies you have.  All medicines you are taking, including vitamins, herbs, eye drops, creams, and over-the-counter medicines.  Any blood disorders you have.  Any surgeries you have had.  Any medical conditions you have.  Whether you are pregnant or may be pregnant. What are the risks? Generally, this is a safe procedure. However, problems may occur, including:  Allergic reaction to dye (contrast) that may be used during the procedure. What happens before the procedure? No specific preparation is needed. You may eat and drink normally. What happens during the procedure?   An IV tube may be inserted into one of your veins.  You may receive contrast through this tube. A contrast is an injection that improves the quality of the pictures from your heart.  A gel will be applied to your chest.  A wand-like tool (transducer) will be moved over your chest. The gel will help to transmit the sound waves from the transducer.  The sound waves will harmlessly bounce off of your heart to allow the heart images to be captured in real-time motion. The  images will be recorded on a computer. The procedure may vary among health care providers and hospitals. What happens after the procedure?  You may return to your normal, everyday life, including diet, activities, and medicines, unless your health care provider tells you not to do that. Summary  An echocardiogram is a procedure that uses painless sound waves (ultrasound) to produce an image of the heart.  Images from an echocardiogram can provide important information about the size and shape of  your heart, heart muscle function, heart valve function, and fluid buildup around your heart.  You do not need to do anything to prepare before this procedure. You may eat and drink normally.  After the echocardiogram is completed, you may return to your normal, everyday life, unless your health care provider tells you not to do that. This information is not intended to replace advice given to you by your health care provider. Make sure you discuss any questions you have with your health care provider. Document Released: 12/22/1999 Document Revised: 04/16/2018 Document Reviewed: 01/27/2016 Elsevier Patient Education  [place] injection What is this medicine? REGADENOSON is used to test the heart for coronary artery disease. It is used in patients who can not exercise for their stress test. This medicine may be used for other purposes; ask your health care provider or pharmacist if you have questions. COMMON BRAND NAME(S): Lexiscan What should I tell my health care provider before I take this medicine? They need to know if you have any of these conditions:  heart problems  lung or breathing disease, like asthma or COPD  an unusual or allergic reaction to regadenoson, other medicines, foods, dyes, or preservatives  pregnant or trying to get pregnant  breast-feeding How should I use this medicine? This medicine is for injection into a vein. It is given by a health care  professional in a hospital or clinic setting. Talk to your pediatrician regarding the use of this medicine in children. Special care may be needed. Overdosage: If you think you have taken too much of this medicine contact a poison control center or emergency room at once. NOTE: This medicine is only for you. Do not share this medicine with others. What if I miss a dose? This does not apply. What may interact with this medicine?  caffeine  dipyridamole  guarana  theophylline This list may not describe all possible interactions. Give your health care provider a list of all the medicines, herbs, non-prescription drugs, or dietary supplements you use. Also tell them if you smoke, drink alcohol, or use illegal drugs. Some items may interact with your medicine. What should I watch for while using this medicine? Your condition will be monitored carefully while you are receiving this medicine. Do not take medicines, foods, or drinks with caffeine (like coffee, tea, or colas) for at least 12 hours before your test. If you do not know if something contains caffeine, ask your health care professional. What side effects may I notice from receiving this medicine? Side effects that you should report to your doctor or health care professional as soon as possible:  allergic reactions like skin rash, itching or hives, swelling of the face, lips, or tongue  breathing problems  chest pain, tightness or palpitations  severe headache Side effects that usually do not require medical attention (report to your doctor or health care professional if they continue or are bothersome):  flushing  headache  irritation or pain at site where injected  nausea, vomiting This list may not describe all possible side effects. Call your doctor for medical advice about side effects. You may report side effects to FDA at 1-800-FDA-1088. Where should I keep my medicine? This drug is given in a hospital or clinic and  will not be stored at home. NOTE: This sheet is a summary. It may not cover all possible information. If you have questions about this medicine, talk to your doctor, pharmacist, or health care provider.  2020 Elsevier/Gold  Standard (2007-08-24 15:08:13)  Cardiac Nuclear Scan A cardiac nuclear scan is a test that is done to check the flow of blood to your heart. It is done when you are resting and when you are exercising. The test looks for problems such as:  Not enough blood reaching a portion of the heart.  The heart muscle not working as it should. You may need this test if:  You have heart disease.  You have had lab results that are not normal.  You have had heart surgery or a balloon procedure to open up blocked arteries (angioplasty).  You have chest pain.  You have shortness of breath. In this test, a special dye (tracer) is put into your bloodstream. The tracer will travel to your heart. A camera will then take pictures of your heart to see how the tracer moves through your heart. This test is usually done at a hospital and takes 2-4 hours. Tell a doctor about:  Any allergies you have.  All medicines you are taking, including vitamins, herbs, eye drops, creams, and over-the-counter medicines.  Any problems you or family members have had with anesthetic medicines.  Any blood disorders you have.  Any surgeries you have had.  Any medical conditions you have.  Whether you are pregnant or may be pregnant. What are the risks? Generally, this is a safe test. However, problems may occur, such as:  Serious chest pain and heart attack. This is only a risk if the stress portion of the test is done.  Rapid heartbeat.  A feeling of warmth in your chest. This feeling usually does not last long.  Allergic reaction to the tracer. What happens before the test?  Ask your doctor about changing or stopping your normal medicines. This is important.  Follow instructions from your  doctor about what you cannot eat or drink.  Remove your jewelry on the day of the test. What happens during the test?  An IV tube will be inserted into one of your veins.  Your doctor will give you a small amount of tracer through the IV tube.  You will wait for 20-40 minutes while the tracer moves through your bloodstream.  Your heart will be monitored with an electrocardiogram (ECG).  You will lie down on an exam table.  Pictures of your heart will be taken for about 15-20 minutes.  You may also have a stress test. For this test, one of these things may be done: ? You will be asked to exercise on a treadmill or a stationary bike. ? You will be given medicines that will make your heart work harder. This is done if you are unable to exercise.  When blood flow to your heart has peaked, a tracer will again be given through the IV tube.  After 20-40 minutes, you will get back on the exam table. More pictures will be taken of your heart.  Depending on the tracer that is used, more pictures may need to be taken 3-4 hours later.  Your IV tube will be removed when the test is over. The test may vary among doctors and hospitals. What happens after the test?  Ask your doctor: ? Whether you can return to your normal schedule, including diet, activities, and medicines. ? Whether you should drink more fluids. This will help to remove the tracer from your body. Drink enough fluid to keep your pee (urine) pale yellow.  Ask your doctor, or the department that is doing the test: ? When  will my results be ready? ? How will I get my results? Summary  A cardiac nuclear scan is a test that is done to check the flow of blood to your heart.  Tell your doctor whether you are pregnant or may be pregnant.  Before the test, ask your doctor about changing or stopping your normal medicines. This is important.  Ask your doctor whether you can return to your normal activities. You may be asked to  drink more fluids. This information is not intended to replace advice given to you by your health care provider. Make sure you discuss any questions you have with your health care provider. Document Released: 06/09/2017 Document Revised: 04/15/2018 Document Reviewed: 06/09/2017 Elsevier Patient Education  2020 [employer].

## 2018-10-07 ENCOUNTER — Telehealth: Payer: Self-pay

## 2018-10-07 LAB — BASIC METABOLIC PANEL
BUN/Creatinine Ratio: 17 (ref 9–23)
BUN: 14 mg/dL (ref 6–24)
CO2: 23 mmol/L (ref 20–29)
Calcium: 9.4 mg/dL (ref 8.7–10.2)
Chloride: 104 mmol/L (ref 96–106)
Creatinine, Ser: 0.81 mg/dL (ref 0.57–1.00)
GFR calc Af Amer: 92 mL/min/{1.73_m2} (ref >59)
GFR calc non Af Amer: 80 mL/min/{1.73_m2} (ref >59)
Glucose: 95 mg/dL (ref 65–99)
Potassium: 4.3 mmol/L (ref 3.5–5.2)
Sodium: 142 mmol/L (ref 134–144)

## 2018-10-07 LAB — CBC
Hematocrit: 42.6 % (ref 34.0–46.6)
Hemoglobin: 13.9 g/dL (ref 11.1–15.9)
MCH: 29.9 pg (ref 26.6–33.0)
MCHC: 32.6 g/dL (ref 31.5–35.7)
MCV: 92 fL (ref 79–97)
Platelets: 275 10*3/uL (ref 150–450)
RBC: 4.65 x10E6/uL (ref 3.77–5.28)
RDW: 13 % (ref 11.7–15.4)
WBC: 10.1 10*3/uL (ref 3.4–10.8)

## 2018-10-07 LAB — MAGNESIUM: Magnesium: 2.1 mg/dL (ref 1.6–2.3)

## 2018-10-07 NOTE — Telephone Encounter (Signed)
Results relayed, no further questions. 

## 2018-10-07 NOTE — Telephone Encounter (Signed)
-----   Message from Berniece Salines, DO sent at 10/07/2018  8:29 AM EDT ----- Normal labs please notify patient.

## 2018-10-12 ENCOUNTER — Telehealth: Payer: Self-pay

## 2018-10-12 NOTE — Telephone Encounter (Signed)
I spoke with Mrs and Mr. Yeakel regarding Mrs Deller's CT scan results.  I told them the node in her lung is unchanged.  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  I let them know that Dr. Denman George would like to meet with Mrs. Trebilcock for further discussion.  An appointment was made for 10/23/2018 at 0900. Mrs. And Mr. Vanloan verbalized understanding.

## 2018-10-14 ENCOUNTER — Telehealth (HOSPITAL_COMMUNITY): Payer: Self-pay | Admitting: *Deleted

## 2018-10-14 NOTE — Telephone Encounter (Signed)
Patient given detailed instructions per Myocardial Perfusion Study Information Sheet for the test on 10/22/18. Patient notified to arrive 15 minutes early and that it is imperative to arrive on time for appointment to keep from having the test rescheduled.  If you need to cancel or reschedule your appointment, please call the office within 24 hours of your appointment. . Patient verbalized understanding. Kirstie Peri

## 2018-10-23 ENCOUNTER — Other Ambulatory Visit: Payer: Self-pay

## 2018-10-23 ENCOUNTER — Encounter: Payer: Self-pay | Admitting: Gynecologic Oncology

## 2018-10-23 ENCOUNTER — Inpatient Hospital Stay: Payer: Medicare Other | Attending: Gynecologic Oncology | Admitting: Gynecologic Oncology

## 2018-10-23 VITALS — BP 128/88 | HR 75 | Temp 98.3°F | Resp 18 | Ht 63.0 in | Wt 217.0 lb

## 2018-10-23 DIAGNOSIS — C772 Secondary and unspecified malignant neoplasm of intra-abdominal lymph nodes: Secondary | ICD-10-CM

## 2018-10-23 DIAGNOSIS — K219 Gastro-esophageal reflux disease without esophagitis: Secondary | ICD-10-CM | POA: Diagnosis not present

## 2018-10-23 DIAGNOSIS — C541 Malignant neoplasm of endometrium: Secondary | ICD-10-CM | POA: Diagnosis present

## 2018-10-23 DIAGNOSIS — G4733 Obstructive sleep apnea (adult) (pediatric): Secondary | ICD-10-CM | POA: Insufficient documentation

## 2018-10-23 DIAGNOSIS — Z90710 Acquired absence of both cervix and uterus: Secondary | ICD-10-CM | POA: Diagnosis not present

## 2018-10-23 DIAGNOSIS — I10 Essential (primary) hypertension: Secondary | ICD-10-CM | POA: Diagnosis not present

## 2018-10-23 DIAGNOSIS — Z79899 Other long term (current) drug therapy: Secondary | ICD-10-CM | POA: Insufficient documentation

## 2018-10-23 DIAGNOSIS — F329 Major depressive disorder, single episode, unspecified: Secondary | ICD-10-CM | POA: Insufficient documentation

## 2018-10-23 DIAGNOSIS — M545 Low back pain: Secondary | ICD-10-CM | POA: Insufficient documentation

## 2018-10-23 DIAGNOSIS — Z90722 Acquired absence of ovaries, bilateral: Secondary | ICD-10-CM | POA: Insufficient documentation

## 2018-10-23 DIAGNOSIS — Z87891 Personal history of nicotine dependence: Secondary | ICD-10-CM | POA: Insufficient documentation

## 2018-10-23 DIAGNOSIS — M542 Cervicalgia: Secondary | ICD-10-CM | POA: Diagnosis not present

## 2018-10-23 DIAGNOSIS — E785 Hyperlipidemia, unspecified: Secondary | ICD-10-CM | POA: Insufficient documentation

## 2018-10-23 HISTORY — DX: Secondary and unspecified malignant neoplasm of intra-abdominal lymph nodes: C77.2

## 2018-10-23 NOTE — Patient Instructions (Signed)
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 She is recommending a PET scan to look for any other potential areas of cancer regrowing. If the PET scan confirms that the lymph nodes look like they have cancer in them, she will be recommending chemotherapy treatments. She will discuss your case at a tumor board conference to see if there is a role for radiation.  She will schedule the PET for [facility] and schedule an appointment with a chemotherapy doctor pending the results.

## 2018-10-23 NOTE — Progress Notes (Signed)
Follow-up Note: Gyn-Onc  Consult was initially requested by Dr. Alfonse Spruce for the evaluation of Allison Spencer 58 y.o. female  CC:  Chief Complaint  Patient presents with  . Endometrial cancer [facility])    Assessment/Plan:  Allison Spencer  is a 58 y.o.  year old with recurrent grade 1 endometrioid endometrial cancer diagnosed in January, 2018. 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   She was initially recommended adjuvant chemotherapy but declined as she wanted to treat her cancer with "cancer fighting foods".   Nodal (para-aortic) recurrence on CT on 10/06/18.    Recommend PET to confirm disease status (including extent of recurrence). If localized to the PA nodes, recommend chemotherapy vs pembro with consideration of radiation. Will discuss at tumor board.   HPI: Allison Spencer is a 58 year old G3P3 who is seen in consultation at the request of Dr Alfonse Spruce for grade 1 endometrial cancer. She was for a routine pap smear by her PCP, Heide Scales, FNP on 07/05/15. This revealed atypical glandular cells - endometrial. She was then seen by Dr Alfonse Spruce on 11/24/15 and reported a single episode of light pink spotting but otherwise no bleeding. 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  The patient has had a recent history of a laparoscopic sigmoid colectomy for diverticulitis in 2016. She also has a history of a laparoscopic cholecystectomy and tubal ligation.  She has morbid obesity and severe sleep apnea.   On 01/18/16 she underwent robotic assisted total hysterectomy, BSO, and SLN biopsy. Surgery was uncomplicated and there were no suspicious findings intraoperatively.  Final pathology revealed a stage IIIC1 endometrioid endometrial adenocarcinoma with a 3.3cm tumor  invading <50% (1cm of 2.4cm) with no LVSI. However, bilateral obturator SLN's revealed micrometastases on both H&E and IHC.  Postoperative imaging (including CT abdo/pelvis and chest x ray) revealed no enlarged nodes, residual disease or chest disease.  The patient was recommended to received 6 cycles of adjuvant carboplatin and paclitaxel in accordance with NCCN guidelines. She has met with medical oncology, Dr Heath Lark, but declined adjuvant chemotherapy as she was concerned about immunity.  She elected for an "anti-cancer" diet and is losing weight. She is completing antibiotics for H pylori and TB exposure.  She has been tested for Lynch syndrome (her tumor is MSI positive with loss of MLH1 expression), however, Lynch syndrome testing was negative.  Her back pain has been stable since surgery. In 2019 she reported + radicular pain down left leg. Post surgical CT imaging did not show any apparent disease. She has a known disc prolapse and is seeing an orthopedic surgeon for this later this week. Repeat CT scan on 05/26/17 showed no evidence of recurrence including no adenopathy in retroperitoneal region, and no explanation for her pain.   She has had a persistent cough since October, 2019. CT chest in October, 2019 showed a tiny subpleural nodule in the anterior aspect of the left lower lobe abutting the major fissure. Recommended annual follow-up. Suspected to be benign. Stable liver cyst also seen. CXR on 02/03/17 showed normal heart and lungs.    Interval Hx:   She has progressive low back pain, sciatic pain and neck pains.   Due to her back pain a CT chest abdomen pelvis was performed at [facility] on October 06, 2018.  It  revealed a stable 5 x 2 mm perifissural nodule in the left lower lobe of the lung.  No signs of pulmonary metastases.  CT of the abdomen and pelvis revealed a 9 mm short axis right periaortic node that was new and suspicious for recurrent endometrial cancer.   There was no other gross abdominal or retroperitoneal recurrence.   Current Meds:  Outpatient Encounter Medications as of 10/23/2018  Medication Sig  . B Complex Vitamins (VITAMIN-B COMPLEX) TABS Take 1 tablet by mouth daily.   Marland Kitchen dicyclomine (BENTYL) 10 MG capsule Take 10 mg by mouth 2 (two) times daily as needed for spasms.   Marland Kitchen escitalopram (LEXAPRO) 20 MG tablet TAKE ONE AND ONE HALF (1.5) TABLETS BY MOUTH EVERY MORNING  . famotidine (PEPCID) 40 MG tablet TAKE 1 (ONE) TABLET TWICE DAILY, WITH BREAKFAST AND AT BEDTIME  . fluticasone (FLONASE) 50 MCG/ACT nasal spray INSTILL 2 SPRAYS INTO EACH NOSTRIL ONCE A DAY  . ketoconazole (NIZORAL) 2 % shampoo ketoconazole 2 % shampoo  APPLY TO WASH TWICE WEEKLY  . losartan (COZAAR) 25 MG tablet Take 25 mg by mouth daily.  Marland Kitchen nystatin cream (MYCOSTATIN)   . pantoprazole (PROTONIX) 40 MG tablet Take 40 mg by mouth 2 (two) times daily.   . pravastatin (PRAVACHOL) 20 MG tablet TAKE 1 TABLET BY MOUTH DAILY  . pregabalin (LYRICA) 150 MG capsule Take 150 mg by mouth 2 (two) times daily.  . solifenacin (VESICARE) 10 MG tablet Vesicare 10 mg tablet  TAKE 1 TABLET BY MOUTH EVERY DAY  . traMADol (ULTRAM) 50 MG tablet tramadol 50 mg tablet  TAKE 1 TABLET BY MOUTH TWICE A DAY AS NEEDED   No facility-administered encounter medications on file as of 10/23/2018.     Allergy:  Allergies  Allergen Reactions  . Codeine Nausea And Vomiting  . Sulfa Antibiotics Nausea And Vomiting    Social Hx:   Social History   Socioeconomic History  . Marital status: Married    Spouse name: Richard  . Number of children: 3  . Years of education: Not on file  . Highest education level: Not on file  Occupational History  . Occupation: Non-working, applying disability  Social Needs  . Financial resource strain: Not on file  . Food insecurity    Worry: Not on file    Inability: Not on file  . Transportation needs    Medical: Not on file    Non-medical: Not on file   Tobacco Use  . Smoking status: Former Smoker    Packs/day: 1.00    Years: 10.00    Pack years: 10.00    Types: Cigarettes    Quit date: 12/05/1988    Years since quitting: 29.9  . Smokeless tobacco: Never Used  Substance and Sexual Activity  . Alcohol use: No  . Drug use: No  . Sexual activity: Yes    Partners: Male  Lifestyle  . Physical activity    Days per week: Not on file    Minutes per session: Not on file  . Stress: Not on file  Relationships  . Social Herbalist on phone: Not on file    Gets together: Not on file    Attends religious service: Not on file    Active member of club or organization: Not on file    Attends meetings of clubs or organizations: Not on file    Relationship status: Not on file  . Intimate partner violence    Fear of  current or ex partner: Not on file    Emotionally abused: Not on file    Physically abused: Not on file    Forced sexual activity: Not on file  Other Topics Concern  . Not on file  Social History Narrative   Taking care of her grandson who has autism, 67 years old    Past Surgical Hx:  Past Surgical History:  Procedure Laterality Date  . CHOLECYSTECTOMY  2000  . COLECTOMY  2016  . colonscopy    . ROBOTIC ASSISTED TOTAL HYSTERECTOMY WITH BILATERAL SALPINGO OOPHERECTOMY Bilateral 01/18/2016   Procedure: XI ROBOTIC ASSISTED TOTAL HYSTERECTOMY WITH BILATERAL SALPINGO OOPHORECTOMY WITH SENTINAL LYMPH NODE BIOPSY; LYSIS OF ADHESIONS;  Surgeon: Everitt Amber, MD;  Location: [facility];  Service: Gynecology;  Laterality: Bilateral;  . TUBAL LIGATION  1991  . UPPER GI ENDOSCOPY  01/10/2016    Past Medical Hx:  Past Medical History:  Diagnosis Date  . Arthritis   . Complication of anesthesia 01/10/2016   when [place] feels like lump in throat had endo for bur feeling continues  . Depression   . Diverticulitis   . Endometrial cancer ([place])   . Essential hypertension   . Exposure to TB    latent takes rifadin for 4 months  for started nov 2017  . Family history of brain cancer   . Family history of breast cancer   . Family history of colon cancer   . GERD (gastroesophageal reflux disease)   . H. pylori infection   . Headache    migraines  . Heart palpitations   . History of hiatal hernia   . Hyperlipidemia   . IBS (irritable bowel syndrome)   . OSA (obstructive sleep apnea)    not used  last 3 weeks due to cold cpap setting of 11  . Urinary incontinence     Past Gynecological History:  SVD x 3 No LMP recorded. Patient is postmenopausal.  Family Hx:  Family History  Problem Relation Age of Onset  . COPD Mother   . CAD Father   . Cancer Maternal Grandmother 80       breast ca  . Cancer Maternal Aunt        lung ca  . Cancer Maternal Uncle        brain ca  . Pulmonary embolism Sister   . Heart attack Brother   . COPD Brother   . Colon cancer Maternal Uncle   . Brain cancer Cousin        dx under 50    Review of Systems:  Constitutional  Feels well,    ENT Normal appearing ears and nares bilaterally Skin/Breast  No rash, sores, jaundice, itching, dryness Cardiovascular  No chest pain, shortness of breath, or edema  Pulmonary  No cough no wheeze.  Gastro Intestinal  No nausea, vomitting, or diarrhoea. No bright red blood per rectum, no abdominal pain, change in bowel movement, or constipation.  Genito Urinary  + frequency, + urgency, no dysuria, no bleeding Musculo Skeletal  + back pain (left lumbar) Neurologic  + radicular pain down left leg Psychology  No depression, anxiety, insomnia.   Vitals:  Blood pressure 128/88, pulse 75, temperature 98.3 F (36.8 C), temperature source Temporal, resp. rate 18, height '5\' 3"'$  (1.6 m), weight 217 lb (98.4 kg).  Physical Exam: WD in NAD Neck  Supple NROM, without any enlargements.  Lymph Node Survey No cervical supraclavicular or inguinal adenopathy Cardiovascular  Pulse normal rate, regularity and  rhythm. S1 and S2 normal.  Lungs   Clear to auscultation bilateraly, without wheezes/crackles/rhonchi. Good air movement.  Skin  No rash/lesions/breakdown  Psychiatry  Alert and oriented to person, place, and time  Abdomen  Normoactive bowel sounds, abdomen soft, non-tender and obese without evidence of hernia.  Back No CVA tenderness Genito Urinary: vagina normal, no lesions, no blood. No masses.  Rectal  deferred.  Extremities  No bilateral cyanosis, clubbing or edema.  Thereasa Solo, MD  10/23/2018, 9:32 AM

## 2018-11-04 ENCOUNTER — Ambulatory Visit
Admission: RE | Admit: 2018-11-04 | Discharge: 2018-11-04 | Disposition: A | Discharge status: Home / Self Care | Payer: Self-pay | Source: Ambulatory Visit | Attending: Gynecologic Oncology | Admitting: Gynecologic Oncology

## 2018-11-04 ENCOUNTER — Other Ambulatory Visit: Payer: Self-pay | Admitting: Oncology

## 2018-11-04 DIAGNOSIS — C541 Malignant neoplasm of endometrium: Secondary | ICD-10-CM

## 2018-11-05 ENCOUNTER — Telehealth: Payer: Self-pay

## 2018-11-05 NOTE — Telephone Encounter (Signed)
LM for patient to call back to discuss the results . She needs to be informed that the PET scan shows only the solitary enlarged retroperitoneal lymph node indicating recurrent disease. Her case will be presented  At the Tumor Board on 11-16-18 to discuss treatment plan per Melissa Cross,NP.

## 2018-11-06 NOTE — Telephone Encounter (Signed)
Told Allison Spencer the results of the Pet scan as noted below. Pt verbalized understanding.

## 2018-11-09 ENCOUNTER — Other Ambulatory Visit: Payer: Self-pay

## 2018-11-09 ENCOUNTER — Ambulatory Visit (INDEPENDENT_AMBULATORY_CARE_PROVIDER_SITE_OTHER): Payer: Medicare Other

## 2018-11-09 ENCOUNTER — Encounter: Payer: Self-pay | Admitting: Cardiology

## 2018-11-09 ENCOUNTER — Ambulatory Visit (INDEPENDENT_AMBULATORY_CARE_PROVIDER_SITE_OTHER): Payer: Medicare Other | Admitting: Cardiology

## 2018-11-09 ENCOUNTER — Encounter: Payer: Self-pay | Admitting: *Deleted

## 2018-11-09 VITALS — BP 118/86 | HR 71 | Ht 63.0 in | Wt 214.0 lb

## 2018-11-09 DIAGNOSIS — R079 Chest pain, unspecified: Secondary | ICD-10-CM

## 2018-11-09 DIAGNOSIS — I2510 Atherosclerotic heart disease of native coronary artery without angina pectoris: Secondary | ICD-10-CM

## 2018-11-09 DIAGNOSIS — E782 Mixed hyperlipidemia: Secondary | ICD-10-CM | POA: Diagnosis not present

## 2018-11-09 NOTE — Progress Notes (Signed)
  Echocardiogram 2D Echocardiogram has been performed.  Darlina Sicilian [facility] 11/09/2018 13:49

## 2018-11-09 NOTE — Patient Instructions (Signed)
Medication Instructions:  Your physician recommends that you continue on your current medications as directed. Please refer to the Current Medication list given to you today.  *If you need a refill on your cardiac medications before your next appointment, please call your pharmacy*  Lab Work: None If you have labs (blood work) drawn today and your tests are completely normal, you will receive your results only by: Marland Kitchen MyChart Message (if you have MyChart) OR . A paper copy in the mail If you have any lab test that is abnormal or we need to change your treatment, we will call you to review the results.  Testing/Procedures: Your physician has requested that you have a lexiscan myoview. For further information please visit [email or web address]. Please follow instruction sheet, as given.    Follow-Up: At [facility], you and your health needs are our priority.  As part of our continuing mission to provide you with exceptional heart care, we have created designated Provider Care Teams.  These Care Teams include your primary Cardiologist (physician) and Advanced Practice Providers (APPs -  Physician Assistants and Nurse Practitioners) who all work together to provide you with the care you need, when you need it.  Your next appointment:   3 months  The format for your next appointment:   In Person  Provider:   Berniece Salines, DO  Other Instructions

## 2018-11-09 NOTE — Progress Notes (Signed)
Cardiology Office Note:    Date:  11/09/2018   ID:  Allison Spencer, DOB [birth year], MRN [id number]  PCP:  Allison Riches, NP  Cardiologist:  Allison Salines, DO  Electrophysiologist:  None    Referring MD: Allison Riches, NP   Chief Complaint  Patient presents with  . Follow-up    History of Present Illness:    Allison Spencer is a 59 y.o. 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   Last saw patient on September 27, 2018 at which time she was experiencing intermittent chest pain.  During in her visit we did order a transthoracic echocardiogram and a pharmacologic stress test.  Interim the patient has had some bad news with a phone call from her oncologist telling her that her major cancer has recurred it seems that there may be some metastasis.  She is waiting for results from the tumor board for treatment plan.  She was able to get her echocardiogram today.  But her stress test is still pending.  Past Medical History:  Diagnosis Date  . Arthritis   . Complication of anesthesia 01/10/2016   when [place] feels like lump in throat had endo for bur feeling continues  . Depression   . Diverticulitis   . Endometrial cancer ([place])   . Essential hypertension   . 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 Family history of brain cancer   . Family history of breast cancer   . Family history of colon cancer   . GERD (gastroesophageal reflux disease)   . H. pylori infection   . Headache    migraines  . Heart palpitations   . History of hiatal hernia   . Hyperlipidemia   . IBS (irritable bowel syndrome)   . OSA (obstructive sleep apnea)    not used  last 3 weeks due to cold cpap setting of 11  . Urinary incontinence      Past Surgical History:  Procedure Laterality Date  . CHOLECYSTECTOMY  2000  . COLECTOMY  2016  . colonscopy    . ROBOTIC ASSISTED TOTAL HYSTERECTOMY WITH BILATERAL SALPINGO OOPHERECTOMY Bilateral 01/18/2016   Procedure: XI ROBOTIC ASSISTED TOTAL HYSTERECTOMY WITH BILATERAL SALPINGO OOPHORECTOMY WITH SENTINAL LYMPH NODE BIOPSY; LYSIS OF ADHESIONS;  Surgeon: Everitt Amber, MD;  Location: [facility];  Service: Gynecology;  Laterality: Bilateral;  . TUBAL LIGATION  1991  . UPPER GI ENDOSCOPY  01/10/2016    Current Medications: Current Meds  Medication Sig  . B Complex Vitamins (VITAMIN-B COMPLEX) TABS Take 1 tablet by mouth daily.   Marland Kitchen dicyclomine (BENTYL) 10 MG capsule Take 10 mg by mouth 2 (two) times daily as needed for spasms.   Marland Kitchen escitalopram (LEXAPRO) 20 MG tablet TAKE ONE AND ONE HALF (1.5) TABLETS BY MOUTH EVERY MORNING  . famotidine (PEPCID) 40 MG tablet TAKE 1 (ONE) TABLET TWICE DAILY, WITH BREAKFAST AND AT BEDTIME  . fluticasone (FLONASE) 50 MCG/ACT nasal spray INSTILL 2 SPRAYS INTO EACH NOSTRIL ONCE A DAY  . ketoconazole (NIZORAL) 2 % shampoo ketoconazole 2 % shampoo  APPLY TO WASH TWICE WEEKLY  . losartan (COZAAR) 25 MG tablet Take 25 mg by mouth daily.  Marland Kitchen nystatin cream (MYCOSTATIN)   . pantoprazole (PROTONIX)  40 MG tablet Take 40 mg by mouth 2 (two) times daily.   . pravastatin (PRAVACHOL) 20 MG tablet TAKE 1 TABLET BY MOUTH DAILY  . pregabalin (LYRICA) 150 MG capsule Take 150 mg by mouth 2 (two) times daily.  . solifenacin (VESICARE) 10 MG tablet Vesicare 10 mg tablet  TAKE 1 TABLET BY MOUTH EVERY DAY  . traMADol (ULTRAM) 50 MG tablet tramadol 50 mg tablet  TAKE 1 TABLET BY MOUTH TWICE A DAY AS NEEDED     Allergies:   Codeine and Sulfa antibiotics   Social History   Socioeconomic History  . Marital status: Married    Spouse name: Richard  . Number of children: 3  . Years of education: Not on file  . Highest education level: Not on file  Occupational History  .  Occupation: Non-working, applying disability  Social Needs  . Financial resource strain: Not on file  . Food insecurity    Worry: Not on file    Inability: Not on file  . Transportation needs    Medical: Not on file    Non-medical: Not on file  Tobacco Use  . Smoking status: Former Smoker    Packs/day: 1.00    Years: 10.00    Pack years: 10.00    Types: Cigarettes    Quit date: 12/05/1988    Years since quitting: 29.9  . Smokeless tobacco: Never Used  Substance and Sexual Activity  . Alcohol use: No  . Drug use: No  . Sexual activity: Yes    Partners: Male  Lifestyle  . Physical activity    Days per week: Not on file    Minutes per session: Not on file  . Stress: Not on file  Relationships  . Social Herbalist on phone: Not on file    Gets together: Not on file    Attends religious service: Not on file    Active member of club or organization: Not on file    Attends meetings of clubs or organizations: Not on file    Relationship status: Not on file  Other Topics Concern  . Not on file  Social History Narrative   Taking care of her grandson who has autism, 27 years old     Family History: The patient's family history includes Brain cancer in her cousin; CAD in her father; COPD in her brother and mother; Cancer in her maternal aunt and maternal uncle; Cancer (age of onset: 69) in her maternal grandmother; Colon cancer in her maternal uncle; Heart attack in her brother; Pulmonary embolism in her sister.  ROS:   Review of Systems  Constitution: Negative for decreased appetite, fever and weight gain.  HENT: Negative for congestion, ear discharge, hoarse voice and sore throat.   Eyes: Negative for discharge, redness, vision loss in right eye and visual halos.  Cardiovascular: Negative for chest pain, dyspnea on exertion, leg swelling, orthopnea and palpitations.  Respiratory: Negative for cough, hemoptysis, shortness of breath and snoring.   Endocrine: Negative  for heat intolerance and polyphagia.  Hematologic/Lymphatic: Negative for bleeding problem. Does not bruise/bleed easily.  Skin: Negative for flushing, nail changes, rash and suspicious lesions.  Musculoskeletal: Negative for arthritis, joint pain, muscle cramps, myalgias, neck pain and stiffness.  Gastrointestinal: Negative for abdominal pain, bowel incontinence, diarrhea and excessive appetite.  Genitourinary: Negative for decreased libido, genital sores and incomplete emptying.  Neurological: Negative for brief paralysis, focal weakness, headaches and loss of balance.  Psychiatric/Behavioral: Negative for altered  mental status, depression and suicidal ideas.  Allergic/Immunologic: Negative for HIV exposure and persistent infections.    EKGs/Labs/Other Studies Reviewed:    The following studies were reviewed today:   EKG:  None today  Recent Labs: 10/06/2018: BUN 14; Creatinine, Ser 0.81; Hemoglobin 13.9; Magnesium 2.1; Platelets 275; Potassium 4.3; Sodium 142  Recent Lipid Panel No results found for: CHOL, TRIG, HDL, CHOLHDL, VLDL, LDLCALC, LDLDIRECT  Physical Exam:    VS:  BP 118/86 (BP Location: Right Arm, Patient Position: Sitting, Cuff Size: Large)   Pulse 71   Ht 5\' 3"  (1.6 m)   Wt 214 lb (97.1 kg)   SpO2 97%   BMI 37.91 kg/m     Wt Readings from Last 3 Encounters:  11/09/18 214 lb (97.1 kg)  10/23/18 217 lb (98.4 kg)  10/06/18 213 lb (96.6 kg)     GEN: Well nourished, well developed in no acute distress HEENT: Normal NECK: No JVD; No carotid bruits LYMPHATICS: No lymphadenopathy CARDIAC: S1S2 noted,RRR, no murmurs, rubs, gallops RESPIRATORY:  Clear to auscultation without rales, wheezing or rhonchi  ABDOMEN: Soft, non-tender, non-distended, +bowel sounds, no guarding. EXTREMITIES: No edema, No cyanosis, no clubbing MUSCULOSKELETAL:  No edema; No deformity  SKIN: Warm and dry NEUROLOGIC:  Alert and oriented x 3, non-focal PSYCHIATRIC:  Normal affect, good  insight  ASSESSMENT:    1. Mild CAD   2. Mixed hyperlipidemia   3. Chest pain, unspecified type    PLAN:    1.  The patient had her echocardiogram performed today.  I was able to review her echocardiogram preliminarily which show normal left ventricular ejection fraction.  No valvular abnormalities.  2.  She still experiencing intermittent chest pain therefore I did encourage patient to have her stress test scheduled so we can be able to complete this.  3.  She has had recurrence of her major cancer and she is waiting for response from the tumor board of how to proceed with her treatment planning.  4.  Her blood pressure is acceptable today to monitor this.  The patient is in agreement with the above plan. The patient left the office in stable condition.  The patient will follow up in 3 months.  Medication Adjustments/Labs and Tests Ordered: Current medicines are reviewed at length with the patient today.  Concerns regarding medicines are outlined above.  Orders Placed This Encounter  Procedures  . MYOCARDIAL PERFUSION IMAGING   No orders of the defined types were placed in this encounter.   Patient Instructions  Medication Instructions:  Your physician recommends that you continue on your current medications as directed. Please refer to the Current Medication list given to you today.  *If you need a refill on your cardiac medications before your next appointment, please call your pharmacy*  Lab Work: None If you have labs (blood work) drawn today and your tests are completely normal, you will receive your results only by: Marland Kitchen MyChart Message (if you have MyChart) OR . A paper copy in the mail If you have any lab test that is abnormal or we need to change your treatment, we will call you to review the results.  Testing/Procedures: Your physician has requested that you have a lexiscan myoview. For further information please visit [email or web address]. Please follow instruction  sheet, as given.    Follow-Up: At [facility], you and your health needs are our priority.  As part of our continuing mission to provide you with exceptional heart care, we have created designated Provider  Care Teams.  These Care Teams include your primary Cardiologist (physician) and Advanced Practice Providers (APPs -  Physician Assistants and Nurse Practitioners) who all work together to provide you with the care you need, when you need it.  Your next appointment:   3 months  The format for your next appointment:   In Person  Provider:   Berniece Salines, DO  Other Instructions      Adopting a Healthy Lifestyle.  Know what a healthy weight is for you (roughly BMI <25) and aim to maintain this   Aim for 7+ servings of fruits and vegetables daily   65-80+ fluid ounces of water or unsweet tea for healthy kidneys   Limit to max 1 drink of alcohol per day; avoid smoking/tobacco   Limit animal fats in diet for cholesterol and heart health - choose grass fed whenever available   Avoid highly processed foods, and foods high in saturated/trans fats   Aim for low stress - take time to unwind and care for your mental health   Aim for 150 min of moderate intensity exercise weekly for heart health, and weights twice weekly for bone health   Aim for 7-9 hours of sleep daily   When it comes to diets, agreement about the perfect plan isnt easy to find, even among the experts. Experts at the [employer] developed an idea known as the Healthy Eating Plate. Just imagine a plate divided into logical, healthy portions.   The emphasis is on diet quality:   Load up on vegetables and fruits - one-half of your plate: Aim for color and variety, and remember that potatoes dont count.   Go for whole grains - one-quarter of your plate: Whole wheat, barley, wheat berries, quinoa, oats, brown rice, and foods made with them. If you want pasta, go with whole wheat pasta.    Protein power - one-quarter of your plate: Fish, chicken, beans, and nuts are all healthy, versatile protein sources. Limit red meat.   The diet, however, does go beyond the plate, offering a few other suggestions.   Use healthy plant oils, such as olive, canola, soy, corn, sunflower and peanut. Check the labels, and avoid partially hydrogenated oil, which have unhealthy trans fats.   If youre thirsty, drink water. Coffee and tea are good in moderation, but skip sugary drinks and limit milk and dairy products to one or two daily servings.   The type of carbohydrate in the diet is more important than the amount. Some sources of carbohydrates, such as vegetables, fruits, whole grains, and beans-are healthier than others.   Finally, stay active  Signed, Allison Salines, DO  11/09/2018 3:04 PM    [facility]

## 2018-11-16 ENCOUNTER — Other Ambulatory Visit: Payer: Self-pay | Admitting: Oncology

## 2018-11-16 ENCOUNTER — Telehealth: Payer: Self-pay | Admitting: Oncology

## 2018-11-16 DIAGNOSIS — C541 Malignant neoplasm of endometrium: Secondary | ICD-10-CM

## 2018-11-16 NOTE — Telephone Encounter (Signed)
Left a message requesting a return call regarding Radiation Oncology and Medical Oncology appointments.

## 2018-11-16 NOTE — Progress Notes (Signed)
Gynecologic Oncology Multi-Disciplinary Disposition Conference Note  Date of the Conference: 11/16/2018  Patient Name: Allison Spencer  Referring Provider: Dr. Alfonse Spruce Primary GYN Oncologist: Dr. Denman George  Stage/Disposition:  Stage IIIC1 recurrent endometrioid endometrial cancer. Disposition is to 1. salvage chemotherapy and/or external beam radiation to retroperitoneal lymph node or 2. Radiation to lymph node followed by pembrolizumab.   This Multidisciplinary conference took place involving physicians from [place], [place], Radiation Oncology, Pathology, Radiology along with the Gynecologic Oncology Nurse Practitioner and RN.  Comprehensive assessment of the patient's malignancy, staging, need for surgery, chemotherapy, radiation therapy, and need for further testing were reviewed. Supportive measures, both inpatient and following discharge were also discussed. The recommended plan of care is documented. Greater than 35 minutes were spent correlating and coordinating this patient's care.

## 2018-11-17 NOTE — Telephone Encounter (Signed)
Allison Spencer with appointments for radiation oncology (11/18/18 3:30 nurse eval, 4:00 consult and Dr. Alvy Bimler (11/20/2018 11:45). She verbalized understanding and agreement.

## 2018-11-18 ENCOUNTER — Ambulatory Visit
Admission: RE | Admit: 2018-11-18 | Discharge: 2018-11-18 | Disposition: A | Discharge status: Home / Self Care | Payer: Medicare Other | Source: Ambulatory Visit | Attending: Radiation Oncology | Admitting: Radiation Oncology

## 2018-11-18 ENCOUNTER — Other Ambulatory Visit: Payer: Self-pay

## 2018-11-18 ENCOUNTER — Encounter: Payer: Self-pay | Admitting: Radiation Oncology

## 2018-11-18 VITALS — BP 118/52 | HR 63 | Temp 98.5°F | Resp 18 | Ht 63.0 in | Wt 214.0 lb

## 2018-11-18 DIAGNOSIS — I10 Essential (primary) hypertension: Secondary | ICD-10-CM | POA: Diagnosis not present

## 2018-11-18 DIAGNOSIS — C541 Malignant neoplasm of endometrium: Secondary | ICD-10-CM

## 2018-11-18 DIAGNOSIS — Z8719 Personal history of other diseases of the digestive system: Secondary | ICD-10-CM | POA: Diagnosis not present

## 2018-11-18 DIAGNOSIS — M549 Dorsalgia, unspecified: Secondary | ICD-10-CM | POA: Diagnosis not present

## 2018-11-18 DIAGNOSIS — M129 Arthropathy, unspecified: Secondary | ICD-10-CM | POA: Diagnosis not present

## 2018-11-18 DIAGNOSIS — Z79899 Other long term (current) drug therapy: Secondary | ICD-10-CM | POA: Insufficient documentation

## 2018-11-18 DIAGNOSIS — G4730 Sleep apnea, unspecified: Secondary | ICD-10-CM | POA: Insufficient documentation

## 2018-11-18 DIAGNOSIS — C772 Secondary and unspecified malignant neoplasm of intra-abdominal lymph nodes: Secondary | ICD-10-CM

## 2018-11-18 DIAGNOSIS — F329 Major depressive disorder, single episode, unspecified: Secondary | ICD-10-CM | POA: Insufficient documentation

## 2018-11-18 DIAGNOSIS — C775 Secondary and unspecified malignant neoplasm of intrapelvic lymph nodes: Secondary | ICD-10-CM | POA: Insufficient documentation

## 2018-11-18 DIAGNOSIS — E785 Hyperlipidemia, unspecified: Secondary | ICD-10-CM | POA: Insufficient documentation

## 2018-11-18 DIAGNOSIS — R918 Other nonspecific abnormal finding of lung field: Secondary | ICD-10-CM | POA: Insufficient documentation

## 2018-11-18 DIAGNOSIS — Z90710 Acquired absence of both cervix and uterus: Secondary | ICD-10-CM | POA: Diagnosis not present

## 2018-11-18 DIAGNOSIS — Z800 Family history of malignant neoplasm of digestive organs: Secondary | ICD-10-CM | POA: Diagnosis not present

## 2018-11-18 DIAGNOSIS — Z803 Family history of malignant neoplasm of breast: Secondary | ICD-10-CM | POA: Diagnosis not present

## 2018-11-18 DIAGNOSIS — K219 Gastro-esophageal reflux disease without esophagitis: Secondary | ICD-10-CM | POA: Insufficient documentation

## 2018-11-18 DIAGNOSIS — Z87891 Personal history of nicotine dependence: Secondary | ICD-10-CM | POA: Insufficient documentation

## 2018-11-18 NOTE — Patient Instructions (Signed)
Coronavirus (COVID-19) Are you at risk?  Are you at risk for the Coronavirus (COVID-19)?  To be considered HIGH RISK for Coronavirus (COVID-19), you have to meet the following criteria:  . Traveled to [place], [place], [place], [place] or [place]; or in the [place] to [place], [place], [place], or [place]; and have fever, cough, and shortness of breath within the last 2 weeks of travel OR . Been in close contact with a person diagnosed with COVID-19 within the last 2 weeks and have fever, cough, and shortness of breath . IF YOU DO NOT MEET THESE CRITERIA, YOU ARE CONSIDERED LOW RISK FOR COVID-19.  What to do if you are HIGH RISK for COVID-19?  . If you are having a medical emergency, call 911. . Seek medical care right away. Before you go to a doctor's office, urgent care or emergency department, call ahead and tell them about your recent travel, contact with someone diagnosed with COVID-19, and your symptoms. You should receive instructions from your physician's office regarding next steps of care.  . When you arrive at healthcare provider, tell the healthcare staff immediately you have returned from visiting [place], [place], [place], [place] or [place]; or traveled in the [place] to [place], [place], [place], or [place]; in the last two weeks or you have been in close contact with a person diagnosed with COVID-19 in the last 2 weeks.   . Tell the health care staff about your symptoms: fever, cough and shortness of breath. . After you have been seen by a medical provider, you will be either: o Tested for (COVID-19) and discharged home on quarantine except to seek medical care if symptoms worsen, and asked to  - Stay home and avoid contact with others until you get your results (4-5 days)  - Avoid travel on public transportation if possible (such as bus, train, or airplane) or o Sent to the Emergency Department by EMS for evaluation, COVID-19 testing, and possible  admission depending on your condition and test results.  What to do if you are LOW RISK for COVID-19?  Reduce your risk of any infection by using the same precautions used for avoiding the common cold or flu:  . Wash your hands often with soap and warm water for at least 20 seconds.  If soap and water are not readily available, use an alcohol-based hand sanitizer with at least 60% alcohol.  . If coughing or sneezing, cover your mouth and nose by coughing or sneezing into the elbow areas of your shirt or coat, into a tissue or into your sleeve (not your hands). . Avoid shaking hands with others and consider head nods or verbal greetings only. . Avoid touching your eyes, nose, or mouth with unwashed hands.  . Avoid close contact with people who are sick. . Avoid places or events with large numbers of people in one location, like concerts or sporting events. . Carefully consider travel plans you have or are making. . If you are planning any travel outside or inside the [place], visit the CDC's Travelers' Health webpage for the latest health notices. . If you have some symptoms but not all symptoms, continue to monitor at home and seek medical attention if your symptoms worsen. . If you are having a medical emergency, call 911.   ADDITIONAL HEALTHCARE OPTIONS FOR PATIENTS  [facility] / e-Visit: [email or web address]         [facility]: [phone]  [facility]: [phone]                   [facility]: [phone]   

## 2018-11-18 NOTE — Progress Notes (Signed)
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 A diagnosis of Endometrial cancer [facility]) was also pertinent to this visit.  Recurrent grade 1 endometrioid endometrial cancer.  HISTORY OF PRESENT ILLNESS::Allison Spencer is a 58 y.o. female who is accompanied by no one due to COVID-19 restrictions. 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 She underwent robotic assisted total hysterectomy on 01/18/2016. She declined adjuvant chemotherapy because she was concerned about immunity and opted to treat her cancer with "cancer fighting foods".  Per Dr. Serita Grit note on 10/23/2018, CT scan of chest/abdomen/pelvis was done on 10/06/2018 and showed a stable 5 x 2 mm perifissural nodule in the left lower lobe of the lung and a new 9 mm short axis right para-aortic node that was suspicious for recurrent endometrial cancer with nodal metastases.  CT scan was performed due to chronic problems with back pain.  PET scan on 11/02/2018 showed solitary enlarged hypermetabolic retroperitoneal lymph node adjacent to the right anterior margin of the abdominal aorta, compatible with metastatic disease. No additional sites suspicious for hypermetabolic metastatic disease.   PREVIOUS RADIATION THERAPY: No  PAST MEDICAL HISTORY:  Past Medical History:  Diagnosis Date  . Arthritis   . Complication of anesthesia 01/10/2016   when [place] feels like lump in  throat had endo for bur feeling continues  . Depression   . Diverticulitis   . Endometrial cancer ([place])   . Essential hypertension   . 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 Family history of brain cancer   . Family history of breast cancer   . Family history of colon cancer   . GERD (gastroesophageal reflux disease)   . H. pylori infection   . Headache    migraines  . Heart palpitations   . History of hiatal hernia   . Hyperlipidemia   . IBS (irritable bowel syndrome)   . OSA (obstructive sleep apnea)    not used  last 3 weeks due to cold cpap setting of 11  . Urinary incontinence     PAST SURGICAL HISTORY: Past Surgical History:  Procedure Laterality Date  . CHOLECYSTECTOMY  2000  . COLECTOMY  2016  . colonscopy    . ROBOTIC ASSISTED TOTAL HYSTERECTOMY WITH BILATERAL SALPINGO OOPHERECTOMY Bilateral 01/18/2016   Procedure: XI ROBOTIC ASSISTED TOTAL HYSTERECTOMY WITH BILATERAL SALPINGO OOPHORECTOMY WITH SENTINAL LYMPH NODE BIOPSY; LYSIS OF ADHESIONS;  Surgeon: Everitt Amber, MD;  Location: [facility];  Service: Gynecology;  Laterality: Bilateral;  . TUBAL LIGATION  1991  . UPPER GI ENDOSCOPY  01/10/2016    FAMILY HISTORY:  Family History  Problem Relation Age of Onset  . COPD Mother   . CAD Father   . Cancer Maternal Grandmother 80       breast ca  . Cancer Maternal Aunt        lung ca  . Cancer Maternal Uncle        brain ca  .  Pulmonary embolism Sister   . Heart attack Brother   . COPD Brother   . Colon cancer Maternal Uncle   . Brain cancer Cousin        dx under 50    SOCIAL HISTORY:  Social History   Tobacco Use  . Smoking status: Former Smoker    Packs/day: 1.00    Years: 10.00    Pack years: 10.00    Types: Cigarettes    Quit date: 12/05/1988    Years since quitting: 29.9  . Smokeless tobacco: Never Used  Substance Use Topics  . Alcohol use: No  . Drug use: No    ALLERGIES:  Allergies  Allergen Reactions  .  Codeine Nausea And Vomiting  . Sulfa Antibiotics Nausea And Vomiting    MEDICATIONS:  Current Outpatient Medications  Medication Sig Dispense Refill  . B Complex Vitamins (VITAMIN-B COMPLEX) TABS Take 1 tablet by mouth daily.     Marland Kitchen dicyclomine (BENTYL) 10 MG capsule Take 10 mg by mouth 2 (two) times daily as needed for spasms.     Marland Kitchen escitalopram (LEXAPRO) 20 MG tablet TAKE ONE AND ONE HALF (1.5) TABLETS BY MOUTH EVERY MORNING  1  . famotidine (PEPCID) 40 MG tablet TAKE 1 (ONE) TABLET TWICE DAILY, WITH BREAKFAST AND AT BEDTIME    . fluticasone (FLONASE) 50 MCG/ACT nasal spray INSTILL 2 SPRAYS INTO EACH NOSTRIL ONCE A DAY  0  . ketoconazole (NIZORAL) 2 % shampoo ketoconazole 2 % shampoo  APPLY TO WASH TWICE WEEKLY    . losartan (COZAAR) 25 MG tablet Take 25 mg by mouth daily.  0  . nystatin cream (MYCOSTATIN)     . pantoprazole (PROTONIX) 40 MG tablet Take 40 mg by mouth 2 (two) times daily.   11  . pravastatin (PRAVACHOL) 20 MG tablet TAKE 1 TABLET BY MOUTH DAILY    . pregabalin (LYRICA) 150 MG capsule Take 150 mg by mouth 2 (two) times daily.    . solifenacin (VESICARE) 10 MG tablet Vesicare 10 mg tablet  TAKE 1 TABLET BY MOUTH EVERY DAY    . traMADol (ULTRAM) 50 MG tablet tramadol 50 mg tablet  TAKE 1 TABLET BY MOUTH TWICE A DAY AS NEEDED     No current facility-administered medications for this encounter.     REVIEW OF SYSTEMS:  A 10+ POINT REVIEW OF SYSTEMS WAS OBTAINED including neurology, dermatology, psychiatry, cardiac, respiratory, lymph, extremities, GI, GU, musculoskeletal, constitutional, reproductive, HEENT.  She reports chronic low back pain with some radiation into the right pelvis and right leg area.  She denies any vaginal bleeding   PHYSICAL EXAM:  height is '5\' 3"'$  (1.6 m) and weight is 214 lb (97.1 kg). Her temporal temperature is 98.5 F (36.9 C). Her blood pressure is 118/52 (abnormal) and her pulse is 63. Her respiration is 18 and oxygen saturation is 97%.    General: Alert and oriented, in no acute distress HEENT: Head is normocephalic. Extraocular movements are intact. Oropharynx is clear. Neck: Neck is supple, no palpable cervical or supraclavicular lymphadenopathy. Heart: Regular in rate and rhythm with no murmurs, rubs, or gallops. Chest: Clear to auscultation bilaterally, with no rhonchi, wheezes, or rales. Abdomen: Soft, nontender, nondistended, with no rigidity or guarding. Extremities: No cyanosis or edema. Lymphatics: see Neck Exam Skin: No concerning lesions. Musculoskeletal: symmetric strength and muscle tone throughout. Neurologic: Cranial nerves II through XII are grossly intact. No obvious focalities. Speech is fluent. Coordination is intact. Psychiatric: Judgment and insight are intact.  Affect is appropriate. Pelvic exam not performed in light of recent exam by Dr. Luanna Salk = 1  0 - Asymptomatic (Fully active, able to carry on all predisease activities without restriction)  1 - Symptomatic but completely ambulatory (Restricted in physically strenuous activity but ambulatory and able to carry out work of a light or sedentary nature. For example, light housework, office work)  2 - Symptomatic, <50% in bed during the day (Ambulatory and capable of all self care but unable to carry out any work activities. Up and about more than 50% of waking hours)  3 - Symptomatic, >50% in bed, but not bedbound (Capable of only limited self-care, confined to bed or chair 50% or more of waking hours)  4 - Bedbound (Completely disabled. Cannot carry on any self-care. Totally confined to bed or chair)  5 - Death   Eustace Pen MM, Creech RH, Tormey DC, et al. [phone]). "Toxicity and response criteria of the [facility]". [place] Oncol. 5 (6): 649-55  LABORATORY DATA:  Lab Results  Component Value Date   WBC 10.1 10/06/2018   HGB 13.9 10/06/2018   HCT 42.6 10/06/2018   MCV 92 10/06/2018   PLT 275 10/06/2018   NEUTROABS  7.5 01/15/2016   Lab Results  Component Value Date   NA 142 10/06/2018   K 4.3 10/06/2018   CL 104 10/06/2018   CO2 23 10/06/2018   GLUCOSE 95 10/06/2018   CREATININE 0.81 10/06/2018   CALCIUM 9.4 10/06/2018      RADIOGRAPHY: No results found.    IMPRESSION: Recurrentgrade 1 endometrioid endometrial cancer.  Based on PET scan the patient has an isolated recurrence within the periaortic region.  Given the location of this recurrence adjacent to the aorta, she is not a candidate for biopsy.  We discussed several options for management.  Also discussed with patient that her case was presented at the multidisciplinary gynecologic oncology conference earlier this week.  We discussed consideration for chemotherapy versus Pembro.  We also discussed consideration for radiation therapy since this is an isolated recurrence in the periaortic nodal chain.  This option would be my preference given that this is an isolated recurrence.  If she were to occur after radiation therapy then she would be a candidate for salvage therapy with chemotherapy or Pembro.  Today, I talked to the patient  about the findings and work-up thus far.  We discussed the natural history of recurrent endometrial cancer and general treatment, highlighting the role of radiotherapy in the management.  We discussed the available radiation techniques, and focused on the details of logistics and delivery.  We reviewed the anticipated acute and late sequelae associated with radiation in this setting.  The patient was encouraged to ask questions that I answered to the best of my ability.  PLAN: Patient will meet with Dr. Alvy Bimler later this week for discussion of chemotherapy and Pembro.  Have asked patient to call me once she has made the decision concerning which  Therapy she decides for management of her recurrent endometrial cancer.    ------------------------------------------------  Blair Promise, PhD, MD  This document  serves as a record of services personally performed by Gery Pray, MD. It was created on his behalf by Clerance Lav, a trained medical scribe. The creation of this record is based on the scribe's personal observations and the provider's statements to them. This document has been checked and approved by the attending provider.

## 2018-11-18 NOTE — Progress Notes (Signed)
GYN Location of Tumor / Histology: recurrent grade 1 endometrioid endometrial cancer diagnosed in January, 2018.  Nodal (para-aortic) recurrence on CT on 10/06/18.     On 01/18/16 she underwent robotic assisted total hysterectomy, BSO, and SLN biopsy. Surgery was uncomplicated and there were no suspicious findings intraoperatively.  Final pathology revealed a stage IIIC1 endometrioid endometrial adenocarcinoma with a 3.3cm tumor invading <50% (1cm of 2.4cm) with no LVSI. However, bilateral obturator SLN's revealed micrometastases on both H&E and IHC.  Postoperative imaging (including CT abdo/pelvis and chest x ray) revealed no enlarged nodes, residual disease or chest disease.  The patient was recommended to received 6 cycles of adjuvant carboplatin and paclitaxel in accordance with NCCN guidelines. She has met with medical oncology, Dr Heath Lark, but declined adjuvant chemotherapy as she was concerned about immunity.  She elected for an "anti-cancer" diet and is losing weight. She is completing antibiotics for H pylori and TB exposure.  She has been tested for Lynch syndrome (her tumor is MSI positive with loss of MLH1 expression), however, Lynch syndrome testing was negative. Past/Anticipated interventions by Gyn/Onc surgery, if any:   Past/Anticipated interventions by medical oncology, if any: Appt scheduled with Dr. Alvy Bimler 11/20/18  Weight changes, if any:  Wt Readings from Last 3 Encounters:  11/18/18 214 lb (97.1 kg)  11/09/18 214 lb (97.1 kg)  10/23/18 217 lb (98.4 kg)     Bowel/Bladder complaints, if any: Pt denies dysuria. Pt reports "little bit for a long time" re: hematuria. Pt denies vaginal bleeding/discharge. Pt denies rectal bleeding. Pt reports medication-related constipation.   Nausea/Vomiting, if any: Pt denies abdominal bloating, N/V.  Pain issues, if any: Pt reports "stomachache" in lower abdomin. Rated 3-4/10.  SAFETY ISSUES:  Prior radiation?  No  Pacemaker/ICD? No  Possible current pregnancy? No  Is the patient on methotrexate? No  Current Complaints / other details:  Pt presents today for initial consult with Dr. Sondra Come for Radiation Oncology.    Gynecologic Oncology Multi-Disciplinary Disposition Conference Note  Date of the Conference: 11/16/2018  Patient Name: Allison Spencer  Referring Provider: Dr. Alfonse Spruce Primary GYN Oncologist: Dr. Denman George  Stage/Disposition:  Stage IIIC1 recurrent endometrioid endometrial cancer. Disposition is to 1. salvage chemotherapy and/or external beam radiation to retroperitoneal lymph node or 2. Radiation to lymph node followed by pembrolizumab.   BP (!) 118/52 (BP Location: Left Arm, Patient Position: Sitting)   Pulse 63   Temp 98.5 F (36.9 C) (Temporal)   Resp 18   Ht '5\' 3"'$  (1.6 m)   Wt 214 lb (97.1 kg)   SpO2 97%   BMI 37.91 kg/m   Loma Sousa, RN BSN

## 2018-11-20 ENCOUNTER — Inpatient Hospital Stay: Payer: Medicare Other | Admitting: Hematology and Oncology

## 2018-11-23 ENCOUNTER — Telehealth: Payer: Self-pay | Admitting: Oncology

## 2018-11-23 NOTE — Telephone Encounter (Signed)
Called Allison Spencer to see how she is feeling.  She said she is doing ok.  She said her stomach is hurting a little.  Discussed rescheduling the appointment with Dr .Alvy Bimler or starting radiation first.  Allison Spencer said she would like to discuss chemotherapy first, especially Pembro before making the decision to start radiation.  Scheduled appointment for 11/27/18 at 12:30.  Advised her to bring a family member.  She verbalized understanding.

## 2018-11-27 ENCOUNTER — Encounter: Payer: Self-pay | Admitting: Hematology and Oncology

## 2018-11-27 ENCOUNTER — Other Ambulatory Visit: Payer: Self-pay

## 2018-11-27 ENCOUNTER — Inpatient Hospital Stay: Payer: Medicare Other | Attending: Gynecologic Oncology | Admitting: Hematology and Oncology

## 2018-11-27 DIAGNOSIS — C541 Malignant neoplasm of endometrium: Secondary | ICD-10-CM | POA: Diagnosis not present

## 2018-11-27 DIAGNOSIS — Z79899 Other long term (current) drug therapy: Secondary | ICD-10-CM | POA: Insufficient documentation

## 2018-11-27 NOTE — Assessment & Plan Note (Addendum)
Her case was recently discussed extensively at GYN oncology tumor board Currently, she is not symptomatic We discussed why surgery is not indicated We discussed why biopsy is not indicated given the location of the disease We discussed treatment options including radiation therapy, chemotherapy or immunotherapy Given localized disease seen on imaging study, I recommend she proceed with radiation treatment first After radiation therapy is completed, we will wait 4 to 6 weeks before repeating PET CT scan If she have evidence of active disease, we will proceed with further discussion about chemotherapy versus immunotherapy I briefly reviewed some of the common side effects to be expected with chemotherapy and immunotherapy I have addressed all her questions and concerns

## 2018-11-27 NOTE — Progress Notes (Signed)
[place] OFFICE PROGRESS NOTE  Patient Care Team: Imagene Riches, NP as PCP - General Berniece Salines, DO as PCP - Cardiology (Cardiology) Gavin Pound, MD as Consulting Physician (Rheumatology)  ASSESSMENT & PLAN:  Endometrial cancer [facility]) Her case was recently discussed extensively at GYN oncology tumor board Currently, she is not symptomatic We discussed why surgery is not indicated We discussed why biopsy is not indicated given the location of the disease We discussed treatment options including radiation therapy, chemotherapy or immunotherapy Given localized disease seen on imaging study, I recommend she proceed with radiation treatment first After radiation therapy is completed, we will wait 4 to 6 weeks before repeating PET CT scan If she have evidence of active disease, we will proceed with further discussion about chemotherapy versus immunotherapy I briefly reviewed some of the common side effects to be expected with chemotherapy and immunotherapy I have addressed all her questions and concerns   No orders of the defined types were placed in this encounter.   INTERVAL HISTORY: Please see below for problem oriented charting. She is referred back here due to recent findings of cancer relapse She is accompanied by her son, Richardson Landry Since last time I saw her, she has declined adjuvant treatment She was follow-up periodically by GYN surgeon for evaluation Most recently, due to some nonspecific symptoms, she had underwent CT imaging followed by PET CT scan which showed signs of cancer relapse  SUMMARY OF ONCOLOGIC HISTORY: Oncology History Overview Note  MSI high disease detected   Endometrial cancer ([place])  07/05/2015 Pathology Results   She had abnormal PAP   11/24/2015 Procedure   She underwent endometrial sampling that came back abnormal   01/10/2016 Procedure   She had EGD which showed esophagitis   01/18/2016 Pathology Results   Diagnosis 1. Lymph  node, sentinel, biopsy, right obturator - MICROMETASTASIS IN ONE OF ONE LYMPH NODES (1/1). 2. Lymph node, sentinel, biopsy, left external - ONE OF ONE LYMPH NODES NEGATIVE FOR CARCINOMA (0/1). 3. Lymph node, sentinel, biopsy, left obturator - MICROMETASTASIS IN ONE OF ONE LYMPH NODES (1/1). 4. Uterus +/- tubes/ovaries, neoplastic - UTERUS: -ENDOMYOMETRIUM: ENDOMETRIOID ADENOCARCINOMA, FIGO GRADE 1, SPANNING 3.3 CM. TUMOR INVADES LESS THAN 1/2 OF THE MYOMETRIUM. SEE ONCOLOGY TABLE. -SEROSA: UNREMARKABLE. NO MALIGNANCY IDENTIFIED. - CERVIX: BENIGN SQUAMOUS AND ENDOCERVICAL MUCOSA. NO DYSPLASIA OR MALIGNANCY. - BILATERAL OVARIES: INCLUSION CYSTS. NO MALIGNANCY. - BILATERAL FALLOPIAN TUBES: UNREMARKABLE RIGHT TUBE. LEFT TUBE IS NOT IDENTIFIED GROSSLY OR MICROSCOPICALLY.   01/18/2016 Surgery   She underwent robotic-assisted laparoscopic total hysterectomy with bilateral salpingoophorectomy, sentinel lymph node biopsy, lysis of adhesions   04/24/2016 Genetic Testing   Negative genetic testing on the TumorNext Lynch with CancerNext.  This is paired germline and tumor analyses for enhanced diagnosis of lynch syndrome plus analyses of 29 additional genes associated with hereditary cancer.  The CancerNext gene panel offered by [employer] includes sequencing and rearrangement analysis for the following 34 genes:   APC, ATM, BARD1, BMPR1A, BRCA1, BRCA2, BRIP1, CDH1, CDK4, CDKN2A, CHEK2, DICER, HOXB13, EPCAM, GREM1, MLH1, MRE11A, MSH2, MSH6, MUTYH, NBN, NF1, PALB2, PMS2, POLD1, POLE, PTEN, RAD50, RAD51C, RAD51D, SMAD4, SMARCA4, STK11, and TP53.   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  Taken with the germline results demonstrating absence of pathogenic mutations or likely pathogenic variants (VLPs) in the mismatch repair (MMR) genes, the likelihood that this individual has Lynch syndrome/HNPCC is greatly decreased; however, the possibility of an  undetected variant of  either germline or somatic origin due to mosaicism and other rare etiologies cannot be ruled out by the current methodology.  Correlation with clinical history and external tumor testing results is advised.    10/29/2018 PET scan   Outside PET scan showed enlarged aorta-caval LN     REVIEW OF SYSTEMS:   Constitutional: Denies fevers, chills or abnormal weight loss Eyes: Denies blurriness of vision Ears, nose, mouth, throat, and face: Denies mucositis or sore throat Respiratory: Denies cough, dyspnea or wheezes Cardiovascular: Denies palpitation, chest discomfort or lower extremity swelling Gastrointestinal:  Denies nausea, heartburn or change in bowel habits Skin: Denies abnormal skin rashes Lymphatics: Denies new lymphadenopathy or easy bruising Neurological:Denies numbness, tingling or new weaknesses Behavioral/Psych: Mood is stable, no new changes  All other systems were reviewed with the patient and are negative.  I have reviewed the past medical history, past surgical history, social history and family history with the patient and they are unchanged from previous note.  ALLERGIES:  is allergic to codeine and sulfa antibiotics.  MEDICATIONS:  Current Outpatient Medications  Medication Sig Dispense Refill  . B Complex Vitamins (VITAMIN-B COMPLEX) TABS Take 1 tablet by mouth daily.     Marland Kitchen dicyclomine (BENTYL) 10 MG capsule Take 10 mg by mouth 2 (two) times daily as needed for spasms.     Marland Kitchen escitalopram (LEXAPRO) 20 MG tablet TAKE ONE AND ONE HALF (1.5) TABLETS BY MOUTH EVERY MORNING  1  . famotidine (PEPCID) 40 MG tablet TAKE 1 (ONE) TABLET TWICE DAILY, WITH BREAKFAST AND AT BEDTIME    . fluticasone (FLONASE) 50 MCG/ACT nasal spray INSTILL 2 SPRAYS INTO EACH NOSTRIL ONCE A DAY  0  . ketoconazole (NIZORAL) 2 % shampoo ketoconazole 2 % shampoo  APPLY TO WASH TWICE WEEKLY    . losartan (COZAAR) 25 MG tablet Take 25 mg by mouth daily.  0  . nystatin cream  (MYCOSTATIN)     . pantoprazole (PROTONIX) 40 MG tablet Take 40 mg by mouth 2 (two) times daily.   11  . pravastatin (PRAVACHOL) 20 MG tablet TAKE 1 TABLET BY MOUTH DAILY    . pregabalin (LYRICA) 150 MG capsule Take 150 mg by mouth 2 (two) times daily.    . solifenacin (VESICARE) 10 MG tablet Vesicare 10 mg tablet  TAKE 1 TABLET BY MOUTH EVERY DAY    . traMADol (ULTRAM) 50 MG tablet tramadol 50 mg tablet  TAKE 1 TABLET BY MOUTH TWICE A DAY AS NEEDED     No current facility-administered medications for this visit.     PHYSICAL EXAMINATION: ECOG PERFORMANCE STATUS: 0 - Asymptomatic  Vitals:   11/27/18 1244  BP: 126/87  Pulse: 76  Resp: 18  Temp: 98 F (36.7 C)  SpO2: 96%   Filed Weights   11/27/18 1244  Weight: 216 lb 1.6 oz (98 kg)    GENERAL:alert, no distress and comfortable Musculoskeletal:no cyanosis of digits and no clubbing  NEURO: alert & oriented x 3 with fluent speech, no focal motor/sensory deficits  LABORATORY DATA:  I have reviewed the data as listed    Component Value Date/Time   NA 142 10/06/2018 1101   K 4.3 10/06/2018 1101   CL 104 10/06/2018 1101   CO2 23 10/06/2018 1101   GLUCOSE 95 10/06/2018 1101   GLUCOSE 93 05/16/2017 1411   BUN 14 10/06/2018 1101   CREATININE 0.81 10/06/2018 1101   CREATININE 0.87 12/06/2015 1022   CALCIUM 9.4 10/06/2018 1101   PROT 6.8 01/15/2016  0930   ALBUMIN 3.6 01/15/2016 0930   AST 16 01/15/2016 0930   ALT 16 01/15/2016 0930   ALKPHOS 122 01/15/2016 0930   BILITOT 0.3 01/15/2016 0930   GFRNONAA 80 10/06/2018 1101   GFRAA 92 10/06/2018 1101    No results found for: SPEP, UPEP  Lab Results  Component Value Date   WBC 10.1 10/06/2018   NEUTROABS 7.5 01/15/2016   HGB 13.9 10/06/2018   HCT 42.6 10/06/2018   MCV 92 10/06/2018   PLT 275 10/06/2018      Chemistry      Component Value Date/Time   NA 142 10/06/2018 1101   K 4.3 10/06/2018 1101   CL 104 10/06/2018 1101   CO2 23 10/06/2018 1101   BUN 14  10/06/2018 1101   CREATININE 0.81 10/06/2018 1101   CREATININE 0.87 12/06/2015 1022      Component Value Date/Time   CALCIUM 9.4 10/06/2018 1101   ALKPHOS 122 01/15/2016 0930   AST 16 01/15/2016 0930   ALT 16 01/15/2016 0930   BILITOT 0.3 01/15/2016 0930       All questions were answered. The patient knows to call the clinic with any problems, questions or concerns. No barriers to learning was detected.  I spent 30 minutes counseling the patient face to face. The total time spent in the appointment was 40 minutes and more than 50% was on counseling and review of test results  Heath Lark, MD 11/27/2018 1:12 PM

## 2018-11-30 ENCOUNTER — Other Ambulatory Visit: Payer: Self-pay | Admitting: Chiropractic Medicine

## 2018-11-30 DIAGNOSIS — M545 Low back pain, unspecified: Secondary | ICD-10-CM

## 2018-12-12 ENCOUNTER — Ambulatory Visit
Admission: RE | Admit: 2018-12-12 | Discharge: 2018-12-12 | Disposition: A | Discharge status: Home / Self Care | Payer: Medicare Other | Source: Ambulatory Visit | Attending: Chiropractic Medicine | Admitting: Chiropractic Medicine

## 2018-12-12 ENCOUNTER — Other Ambulatory Visit: Payer: Self-pay

## 2018-12-12 DIAGNOSIS — M545 Low back pain, unspecified: Secondary | ICD-10-CM

## 2018-12-14 ENCOUNTER — Other Ambulatory Visit: Payer: Self-pay

## 2018-12-14 ENCOUNTER — Ambulatory Visit
Admission: RE | Admit: 2018-12-14 | Discharge: 2018-12-14 | Disposition: A | Discharge status: Home / Self Care | Payer: Medicare Other | Source: Ambulatory Visit | Attending: Radiation Oncology | Admitting: Radiation Oncology

## 2018-12-14 DIAGNOSIS — C541 Malignant neoplasm of endometrium: Secondary | ICD-10-CM | POA: Insufficient documentation

## 2018-12-14 DIAGNOSIS — Z510 Encounter for antineoplastic radiation therapy: Secondary | ICD-10-CM | POA: Diagnosis present

## 2018-12-16 DIAGNOSIS — Z510 Encounter for antineoplastic radiation therapy: Secondary | ICD-10-CM | POA: Diagnosis not present

## 2018-12-17 ENCOUNTER — Other Ambulatory Visit: Payer: Self-pay

## 2018-12-17 ENCOUNTER — Ambulatory Visit
Admission: RE | Admit: 2018-12-17 | Discharge: 2018-12-17 | Disposition: A | Discharge status: Home / Self Care | Payer: Medicare Other | Source: Ambulatory Visit | Attending: Radiation Oncology | Admitting: Radiation Oncology

## 2018-12-17 DIAGNOSIS — Z510 Encounter for antineoplastic radiation therapy: Secondary | ICD-10-CM | POA: Diagnosis not present

## 2018-12-17 DIAGNOSIS — C541 Malignant neoplasm of endometrium: Secondary | ICD-10-CM

## 2018-12-17 NOTE — Progress Notes (Signed)
  Radiation Oncology         (336) [phone] ________________________________  Name: Allison Spencer MRN: [id number]  Date: 12/17/2018  DOB: [birth year]  Simulation Verification Note    ICD-10-CM   1. Endometrial cancer ([place])  C54.1     Status: outpatient  NARRATIVE: The patient was brought to the treatment unit and placed in the planned treatment position. The clinical setup was verified. Then port films were obtained and uploaded to the radiation oncology medical record software.  The treatment beams were carefully compared against the planned radiation fields. The position location and shape of the radiation fields was reviewed. They targeted volume of tissue appears to be appropriately covered by the radiation beams. Organs at risk appear to be excluded as planned.  Based on my personal review, I approved the simulation verification. The patient's treatment will proceed as planned.  -----------------------------------  Blair Promise, PhD, MD

## 2018-12-18 ENCOUNTER — Other Ambulatory Visit: Payer: Self-pay

## 2018-12-18 ENCOUNTER — Ambulatory Visit
Admission: RE | Admit: 2018-12-18 | Discharge: 2018-12-18 | Disposition: A | Discharge status: Home / Self Care | Payer: Medicare Other | Source: Ambulatory Visit | Attending: Radiation Oncology | Admitting: Radiation Oncology

## 2018-12-18 DIAGNOSIS — Z510 Encounter for antineoplastic radiation therapy: Secondary | ICD-10-CM | POA: Diagnosis not present

## 2018-12-21 ENCOUNTER — Ambulatory Visit
Admission: RE | Admit: 2018-12-21 | Discharge: 2018-12-21 | Disposition: A | Discharge status: Home / Self Care | Payer: Medicare Other | Source: Ambulatory Visit | Attending: Radiation Oncology | Admitting: Radiation Oncology

## 2018-12-21 ENCOUNTER — Other Ambulatory Visit: Payer: Self-pay

## 2018-12-21 DIAGNOSIS — Z510 Encounter for antineoplastic radiation therapy: Secondary | ICD-10-CM | POA: Diagnosis not present

## 2018-12-22 ENCOUNTER — Other Ambulatory Visit: Payer: Self-pay

## 2018-12-22 ENCOUNTER — Ambulatory Visit
Admission: RE | Admit: 2018-12-22 | Discharge: 2018-12-22 | Disposition: A | Discharge status: Home / Self Care | Payer: Medicare Other | Source: Ambulatory Visit | Attending: Radiation Oncology | Admitting: Radiation Oncology

## 2018-12-22 ENCOUNTER — Other Ambulatory Visit: Payer: Self-pay | Admitting: Radiation Oncology

## 2018-12-22 DIAGNOSIS — Z510 Encounter for antineoplastic radiation therapy: Secondary | ICD-10-CM | POA: Diagnosis not present

## 2018-12-22 MED ORDER — PROCHLORPERAZINE MALEATE 10 MG PO TABS: 10.0000 mg | ORAL_TABLET | Freq: Four times a day (QID) | ORAL | 0 refills | Status: DC | PRN

## 2018-12-23 ENCOUNTER — Other Ambulatory Visit: Payer: Self-pay

## 2018-12-23 ENCOUNTER — Ambulatory Visit
Admission: RE | Admit: 2018-12-23 | Discharge: 2018-12-23 | Disposition: A | Discharge status: Home / Self Care | Payer: Medicare Other | Source: Ambulatory Visit | Attending: Radiation Oncology | Admitting: Radiation Oncology

## 2018-12-23 DIAGNOSIS — Z510 Encounter for antineoplastic radiation therapy: Secondary | ICD-10-CM | POA: Diagnosis not present

## 2018-12-24 ENCOUNTER — Other Ambulatory Visit: Payer: Self-pay

## 2018-12-24 ENCOUNTER — Ambulatory Visit
Admission: RE | Admit: 2018-12-24 | Discharge: 2018-12-24 | Disposition: A | Discharge status: Home / Self Care | Payer: Medicare Other | Source: Ambulatory Visit | Attending: Radiation Oncology | Admitting: Radiation Oncology

## 2018-12-24 DIAGNOSIS — Z510 Encounter for antineoplastic radiation therapy: Secondary | ICD-10-CM | POA: Diagnosis not present

## 2018-12-25 ENCOUNTER — Other Ambulatory Visit: Payer: Self-pay

## 2018-12-25 ENCOUNTER — Ambulatory Visit
Admission: RE | Admit: 2018-12-25 | Discharge: 2018-12-25 | Disposition: A | Discharge status: Home / Self Care | Payer: Medicare Other | Source: Ambulatory Visit | Attending: Radiation Oncology | Admitting: Radiation Oncology

## 2018-12-25 DIAGNOSIS — Z510 Encounter for antineoplastic radiation therapy: Secondary | ICD-10-CM | POA: Diagnosis not present

## 2018-12-28 ENCOUNTER — Other Ambulatory Visit: Payer: Self-pay

## 2018-12-28 ENCOUNTER — Ambulatory Visit
Admission: RE | Admit: 2018-12-28 | Discharge: 2018-12-28 | Disposition: A | Discharge status: Home / Self Care | Payer: Medicare Other | Source: Ambulatory Visit | Attending: Radiation Oncology | Admitting: Radiation Oncology

## 2018-12-28 DIAGNOSIS — Z510 Encounter for antineoplastic radiation therapy: Secondary | ICD-10-CM | POA: Diagnosis not present

## 2018-12-29 ENCOUNTER — Other Ambulatory Visit: Payer: Self-pay

## 2018-12-29 ENCOUNTER — Ambulatory Visit
Admission: RE | Admit: 2018-12-29 | Discharge: 2018-12-29 | Disposition: A | Discharge status: Home / Self Care | Payer: Medicare Other | Source: Ambulatory Visit | Attending: Radiation Oncology | Admitting: Radiation Oncology

## 2018-12-29 DIAGNOSIS — Z510 Encounter for antineoplastic radiation therapy: Secondary | ICD-10-CM | POA: Diagnosis not present

## 2018-12-30 ENCOUNTER — Other Ambulatory Visit: Payer: Self-pay

## 2018-12-30 ENCOUNTER — Ambulatory Visit
Admission: RE | Admit: 2018-12-30 | Discharge: 2018-12-30 | Disposition: A | Discharge status: Home / Self Care | Payer: Medicare Other | Source: Ambulatory Visit | Attending: Radiation Oncology | Admitting: Radiation Oncology

## 2018-12-30 DIAGNOSIS — Z510 Encounter for antineoplastic radiation therapy: Secondary | ICD-10-CM | POA: Diagnosis not present

## 2018-12-31 ENCOUNTER — Ambulatory Visit
Admission: RE | Admit: 2018-12-31 | Discharge: 2018-12-31 | Disposition: A | Discharge status: Home / Self Care | Payer: Medicare Other | Source: Ambulatory Visit | Attending: Radiation Oncology | Admitting: Radiation Oncology

## 2018-12-31 DIAGNOSIS — Z510 Encounter for antineoplastic radiation therapy: Secondary | ICD-10-CM | POA: Diagnosis not present

## 2019-01-04 ENCOUNTER — Ambulatory Visit
Admission: RE | Admit: 2019-01-04 | Discharge: 2019-01-04 | Disposition: A | Discharge status: Home / Self Care | Payer: Medicare Other | Source: Ambulatory Visit | Attending: Radiation Oncology | Admitting: Radiation Oncology

## 2019-01-04 ENCOUNTER — Other Ambulatory Visit: Payer: Self-pay

## 2019-01-04 DIAGNOSIS — Z510 Encounter for antineoplastic radiation therapy: Secondary | ICD-10-CM | POA: Diagnosis not present

## 2019-01-05 ENCOUNTER — Ambulatory Visit
Admission: RE | Admit: 2019-01-05 | Discharge: 2019-01-05 | Disposition: A | Discharge status: Home / Self Care | Payer: Medicare Other | Source: Ambulatory Visit | Attending: Radiation Oncology | Admitting: Radiation Oncology

## 2019-01-05 ENCOUNTER — Other Ambulatory Visit: Payer: Self-pay

## 2019-01-05 DIAGNOSIS — Z510 Encounter for antineoplastic radiation therapy: Secondary | ICD-10-CM | POA: Diagnosis not present

## 2019-01-06 ENCOUNTER — Ambulatory Visit
Admission: RE | Admit: 2019-01-06 | Discharge: 2019-01-06 | Disposition: A | Discharge status: Home / Self Care | Payer: Medicare Other | Source: Ambulatory Visit | Attending: Radiation Oncology | Admitting: Radiation Oncology

## 2019-01-06 DIAGNOSIS — Z510 Encounter for antineoplastic radiation therapy: Secondary | ICD-10-CM | POA: Diagnosis not present

## 2019-01-07 ENCOUNTER — Other Ambulatory Visit: Payer: Self-pay

## 2019-01-07 ENCOUNTER — Ambulatory Visit
Admission: RE | Admit: 2019-01-07 | Discharge: 2019-01-07 | Disposition: A | Discharge status: Home / Self Care | Payer: Medicare Other | Source: Ambulatory Visit | Attending: Radiation Oncology | Admitting: Radiation Oncology

## 2019-01-07 DIAGNOSIS — Z510 Encounter for antineoplastic radiation therapy: Secondary | ICD-10-CM | POA: Diagnosis not present

## 2019-01-11 ENCOUNTER — Other Ambulatory Visit: Payer: Self-pay

## 2019-01-11 ENCOUNTER — Telehealth: Payer: Self-pay | Admitting: Oncology

## 2019-01-11 ENCOUNTER — Encounter: Payer: Self-pay | Admitting: Oncology

## 2019-01-11 ENCOUNTER — Ambulatory Visit
Admission: RE | Admit: 2019-01-11 | Discharge: 2019-01-11 | Disposition: A | Discharge status: Home / Self Care | Payer: Medicare Other | Source: Ambulatory Visit | Attending: Radiation Oncology | Admitting: Radiation Oncology

## 2019-01-11 DIAGNOSIS — Z510 Encounter for antineoplastic radiation therapy: Secondary | ICD-10-CM | POA: Diagnosis present

## 2019-01-11 DIAGNOSIS — C541 Malignant neoplasm of endometrium: Secondary | ICD-10-CM | POA: Insufficient documentation

## 2019-01-11 NOTE — Progress Notes (Signed)
Faxed referral to [facility] [phone].Allison Spencer

## 2019-01-11 NOTE — Telephone Encounter (Signed)
[facility] and discussed that her original treatment plan was for radiation, wait 6-8 weeks and then repeat PET scan to assess for the need for further treatment.  She is in agreement with this plan.  Also discussed whether she would want further treatment in [place] or [place].  She said she would prefer [place] since it is closer to her.

## 2019-01-11 NOTE — Telephone Encounter (Signed)
She will then need to be referred to see Dr. Bobby Rumpf or Dr Anabel Bene Can you please arrange that and let Dr. Denman George know

## 2019-01-12 ENCOUNTER — Ambulatory Visit
Admission: RE | Admit: 2019-01-12 | Discharge: 2019-01-12 | Disposition: A | Discharge status: Home / Self Care | Payer: Medicare Other | Source: Ambulatory Visit | Attending: Radiation Oncology | Admitting: Radiation Oncology

## 2019-01-12 ENCOUNTER — Other Ambulatory Visit: Payer: Self-pay

## 2019-01-12 DIAGNOSIS — Z510 Encounter for antineoplastic radiation therapy: Secondary | ICD-10-CM | POA: Diagnosis not present

## 2019-01-13 ENCOUNTER — Ambulatory Visit
Admission: RE | Admit: 2019-01-13 | Discharge: 2019-01-13 | Disposition: A | Discharge status: Home / Self Care | Payer: Medicare Other | Source: Ambulatory Visit | Attending: Radiation Oncology | Admitting: Radiation Oncology

## 2019-01-13 DIAGNOSIS — Z510 Encounter for antineoplastic radiation therapy: Secondary | ICD-10-CM | POA: Diagnosis not present

## 2019-01-14 ENCOUNTER — Other Ambulatory Visit: Payer: Self-pay

## 2019-01-14 ENCOUNTER — Ambulatory Visit
Admission: RE | Admit: 2019-01-14 | Discharge: 2019-01-14 | Disposition: A | Discharge status: Home / Self Care | Payer: Medicare Other | Source: Ambulatory Visit | Attending: Radiation Oncology | Admitting: Radiation Oncology

## 2019-01-14 ENCOUNTER — Telehealth: Payer: Self-pay | Admitting: Oncology

## 2019-01-14 DIAGNOSIS — Z510 Encounter for antineoplastic radiation therapy: Secondary | ICD-10-CM | POA: Diagnosis not present

## 2019-01-14 NOTE — Telephone Encounter (Signed)
Received call from [facility] at Dr. Remi Deter office that she reached out to [facility] to schedule an appointment and that patient was confused as to why she needed the appointment.  [facility] and discussed that the plan is for a PET scan in 6-8 weeks to see if she would need further treatment with chemotherapy/immunotherapy.  Reminded her that we had discussed this and that she would like treatment in [place] because it was closer to home.    She discussed the plan with her husband while on the phone and they decided that she would like to keep her care in [place].  Advised her that I will notify Dr. Alvy Bimler and call her tomorrow morning.

## 2019-01-15 ENCOUNTER — Telehealth: Payer: Self-pay | Admitting: Hematology and Oncology

## 2019-01-15 ENCOUNTER — Other Ambulatory Visit: Payer: Self-pay

## 2019-01-15 ENCOUNTER — Ambulatory Visit
Admission: RE | Admit: 2019-01-15 | Discharge: 2019-01-15 | Disposition: A | Discharge status: Home / Self Care | Payer: Medicare Other | Source: Ambulatory Visit | Attending: Radiation Oncology | Admitting: Radiation Oncology

## 2019-01-15 ENCOUNTER — Other Ambulatory Visit: Payer: Self-pay | Admitting: Hematology and Oncology

## 2019-01-15 ENCOUNTER — Encounter: Payer: Self-pay | Admitting: Radiation Oncology

## 2019-01-15 DIAGNOSIS — C541 Malignant neoplasm of endometrium: Secondary | ICD-10-CM

## 2019-01-15 DIAGNOSIS — C772 Secondary and unspecified malignant neoplasm of intra-abdominal lymph nodes: Secondary | ICD-10-CM

## 2019-01-15 DIAGNOSIS — Z510 Encounter for antineoplastic radiation therapy: Secondary | ICD-10-CM | POA: Diagnosis not present

## 2019-01-15 NOTE — Telephone Encounter (Signed)
Called [name] back and discussed plan going forward of labs/CT scan on 2/22 and follow up with Dr. Alvy Bimler on 2/23.  Allison Spencer is in agreement with having them done at [facility].  Advised her that a scheduler will call her with the appointments.  She asked that we also schedule the CT scan.  Told her that I will call her back with that appointment.  Left a message for [facility] with Dr. Remi Deter office to cancel the referral to [facility].

## 2019-01-15 NOTE — Telephone Encounter (Signed)
I am planning labs and CT on 2/22 and see her on 2/23 Labs and CT to be done at [facility] only If she wants things done locally she needs to go to [place] I decided to change to CT instead of PET for long term follow-up Scheduling msg is sent

## 2019-01-15 NOTE — Telephone Encounter (Signed)
Scheduled per 1/8 sch msg. Called and spoke with pt, confirmed 2/22 and 2/23 appt

## 2019-01-15 NOTE — Telephone Encounter (Signed)
Notified Allison Spencer of CT appointment on 03/01/19 at 12 pm at [facility] (NPO 4 hours before, drink contrast at 10 and 11).  She verbalized understanding of appointment and instructions.

## 2019-01-20 NOTE — Progress Notes (Incomplete)
  Patient Name: Allison Spencer MRN: [id number] DOB: [birth year] Referring Physician: Heide Scales (Profile Not Attached) Date of Service: 01/15/2019 [facility], [place]                                                        End Of Treatment Note  Diagnoses: C54.1-Malignant neoplasm of endometrium  Cancer Staging: Recurrent grade 1 endometrioid endometrial cancer.  Intent: Curative  Radiation Treatment Dates: 12/17/2018 through 01/15/2019 Site Technique Total Dose (Gy) Dose per Fx (Gy) Completed Fx Beam Energies  Abdomen: Abd_PA node 3D 50/50 2.5 20/20 6X, 15X   Narrative: The patient tolerated radiation therapy relatively well. She did complain of some nausea, mild to moderate fatigue, constipation, occasional burning with urination, intermittent abdominal pain, and occasional diarrhea. Nausea was improved with prescription medication. On 01/05/2019 examination, there was some erythema and a probable yeast infection of the umbilicus. Patient was given an antifungal powder to place in the area with Q-tips, which did result in improvement.  Plan: The patient will follow-up with radiation oncology in one month.  ________________________________________________  Blair Promise, PhD, MD  This document serves as a record of services personally performed by Gery Pray, MD. It was created on his behalf by Clerance Lav, a trained medical scribe. The creation of this record is based on the scribe's personal observations and the provider's statements to them. This document has been checked and approved by the attending provider.

## 2019-02-15 ENCOUNTER — Other Ambulatory Visit: Payer: Self-pay

## 2019-02-15 ENCOUNTER — Ambulatory Visit
Admission: RE | Admit: 2019-02-15 | Discharge: 2019-02-15 | Disposition: A | Discharge status: Home / Self Care | Payer: Medicare Other | Source: Ambulatory Visit | Attending: Radiation Oncology | Admitting: Radiation Oncology

## 2019-02-15 ENCOUNTER — Encounter: Payer: Self-pay | Admitting: Radiation Oncology

## 2019-02-15 VITALS — BP 117/84 | HR 78 | Temp 98.5°F | Resp 18 | Ht 63.0 in | Wt 207.0 lb

## 2019-02-15 DIAGNOSIS — Z923 Personal history of irradiation: Secondary | ICD-10-CM | POA: Insufficient documentation

## 2019-02-15 DIAGNOSIS — C541 Malignant neoplasm of endometrium: Secondary | ICD-10-CM | POA: Diagnosis not present

## 2019-02-15 DIAGNOSIS — C772 Secondary and unspecified malignant neoplasm of intra-abdominal lymph nodes: Secondary | ICD-10-CM

## 2019-02-15 DIAGNOSIS — Z79899 Other long term (current) drug therapy: Secondary | ICD-10-CM | POA: Insufficient documentation

## 2019-02-15 DIAGNOSIS — M549 Dorsalgia, unspecified: Secondary | ICD-10-CM | POA: Diagnosis not present

## 2019-02-15 NOTE — Patient Instructions (Signed)
Coronavirus (COVID-19) Are you at risk?  Are you at risk for the Coronavirus (COVID-19)?  To be considered HIGH RISK for Coronavirus (COVID-19), you have to meet the following criteria:  . Traveled to [place], [place], [place], [place] or [place]; or in the [place] to [place], [place], [place], or [place]; and have fever, cough, and shortness of breath within the last 2 weeks of travel OR . Been in close contact with a person diagnosed with COVID-19 within the last 2 weeks and have fever, cough, and shortness of breath . IF YOU DO NOT MEET THESE CRITERIA, YOU ARE CONSIDERED LOW RISK FOR COVID-19.  What to do if you are HIGH RISK for COVID-19?  . If you are having a medical emergency, call 911. . Seek medical care right away. Before you go to a doctor's office, urgent care or emergency department, call ahead and tell them about your recent travel, contact with someone diagnosed with COVID-19, and your symptoms. You should receive instructions from your physician's office regarding next steps of care.  . When you arrive at healthcare provider, tell the healthcare staff immediately you have returned from visiting [place], [place], [place], [place] or [place]; or traveled in the [place] to [place], [place], [place], or [place]; in the last two weeks or you have been in close contact with a person diagnosed with COVID-19 in the last 2 weeks.   . Tell the health care staff about your symptoms: fever, cough and shortness of breath. . After you have been seen by a medical provider, you will be either: o Tested for (COVID-19) and discharged home on quarantine except to seek medical care if symptoms worsen, and asked to  - Stay home and avoid contact with others until you get your results (4-5 days)  - Avoid travel on public transportation if possible (such as bus, train, or airplane) or o Sent to the Emergency Department by EMS for evaluation, COVID-19 testing, and possible  admission depending on your condition and test results.  What to do if you are LOW RISK for COVID-19?  Reduce your risk of any infection by using the same precautions used for avoiding the common cold or flu:  . Wash your hands often with soap and warm water for at least 20 seconds.  If soap and water are not readily available, use an alcohol-based hand sanitizer with at least 60% alcohol.  . If coughing or sneezing, cover your mouth and nose by coughing or sneezing into the elbow areas of your shirt or coat, into a tissue or into your sleeve (not your hands). . Avoid shaking hands with others and consider head nods or verbal greetings only. . Avoid touching your eyes, nose, or mouth with unwashed hands.  . Avoid close contact with people who are sick. . Avoid places or events with large numbers of people in one location, like concerts or sporting events. . Carefully consider travel plans you have or are making. . If you are planning any travel outside or inside the [place], visit the CDC's Travelers' Health webpage for the latest health notices. . If you have some symptoms but not all symptoms, continue to monitor at home and seek medical attention if your symptoms worsen. . If you are having a medical emergency, call 911.   ADDITIONAL HEALTHCARE OPTIONS FOR PATIENTS  [place] [facility] / e-Visit: [email or web address]         [facility]: [phone]  [place]   [facility]: [phone]                   [facility]: [phone]   

## 2019-02-15 NOTE — Progress Notes (Signed)
Patient in for follow up denies any bleeding from GI or GU. Feels that she has healed. When will she have a PET scan?

## 2019-02-15 NOTE — Progress Notes (Signed)
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   Radiation Treatment Dates: 12/17/2018 through 01/15/2019 Site Technique Total Dose (Gy) Dose per Fx (Gy) Completed Fx Beam Energies  Abdomen: Abd_PA node 3D 50/50 2.5 20/20 6X, 15X    Narrative:  The patient returns today for routine follow-up.  No significant interval history since the end of treatment.   On review of systems, the patient reports her back pain has improved. The patient denies nausea or bowel complaints.  She denies any abdominal bloating.                             ALLERGIES:  is allergic to codeine and sulfa antibiotics.  Meds: Current Outpatient Medications  Medication Sig Dispense Refill  . B Complex Vitamins (VITAMIN-B COMPLEX) TABS Take 1 tablet by mouth daily.     Marland Kitchen dicyclomine (BENTYL) 10 MG capsule Take 10 mg by mouth 2 (two) times daily as needed for spasms.     Marland Kitchen escitalopram (LEXAPRO) 20 MG tablet TAKE ONE AND ONE HALF (1.5) TABLETS BY MOUTH EVERY MORNING  1  . famotidine (PEPCID) 40 MG tablet TAKE 1 (ONE) TABLET TWICE DAILY, WITH BREAKFAST AND AT BEDTIME    . fluticasone (FLONASE) 50 MCG/ACT nasal spray INSTILL 2 SPRAYS INTO EACH NOSTRIL ONCE A DAY  0  . ketoconazole (NIZORAL) 2 % shampoo ketoconazole 2 % shampoo  APPLY TO WASH TWICE WEEKLY    . losartan (COZAAR) 25 MG tablet Take 25 mg by mouth daily.  0  . nystatin cream (MYCOSTATIN)     . pantoprazole (PROTONIX) 40 MG tablet Take 40 mg by mouth 2 (two) times daily.   11  . pravastatin (PRAVACHOL) 20 MG tablet TAKE 1 TABLET BY MOUTH DAILY    . pregabalin (LYRICA) 150 MG capsule Take 150  mg by mouth 2 (two) times daily.    . prochlorperazine (COMPAZINE) 10 MG tablet Take 1 tablet (10 mg total) by mouth every 6 (six) hours as needed for nausea or vomiting. 30 tablet 0  . solifenacin (VESICARE) 10 MG tablet Vesicare 10 mg tablet  TAKE 1 TABLET BY MOUTH EVERY DAY     No current facility-administered medications for this encounter.    Physical Findings: The patient is in no acute distress. Patient is alert and oriented.  height is 5\' 3"  (1.6 m) and weight is 207 lb (93.9 kg). Her temporal temperature is 98.5 F (36.9 C). Her blood pressure is 117/84 and her pulse is 78. Her respiration is 18 and oxygen saturation is 97%. .  Lungs are clear to auscultation bilaterally. Heart has regular rate and rhythm. No palpable cervical, supraclavicular, or axillary adenopathy. Abdomen soft, non-tender, normal bowel sounds.   Lab Findings: Lab Results  Component Value Date   WBC 10.1 10/06/2018   HGB 13.9 10/06/2018   HCT 42.6 10/06/2018   MCV 92 10/06/2018   PLT 275 10/06/2018    Radiographic Findings: No results found.  Impression: Patient tolerated her radiation therapy well.  Her back pain has improved.  Plan: Routine follow-up  with radiation oncology in three months. Patient is scheduled to have CT of chest/abdomen/pelvis performed on 03/01/2019. Patient is scheduled to follow up with Dr. Alvy Bimler on 03/02/2019.  __________________________________    Blair Promise, PhD, MD  This document serves as a record of services personally performed by Gery Pray, MD. It was created on his behalf by Clerance Lav, a trained medical scribe. The creation of this record is based on the scribe's personal observations and the provider's statements to them. This document has been checked and approved by the attending provider.

## 2019-03-01 ENCOUNTER — Ambulatory Visit (HOSPITAL_COMMUNITY)
Admission: RE | Admit: 2019-03-01 | Discharge: 2019-03-01 | Disposition: A | Discharge status: Home / Self Care | Payer: Medicare Other | Source: Ambulatory Visit | Attending: Hematology and Oncology | Admitting: Hematology and Oncology

## 2019-03-01 ENCOUNTER — Other Ambulatory Visit: Payer: Self-pay

## 2019-03-01 ENCOUNTER — Inpatient Hospital Stay: Payer: Medicare Other | Attending: Gynecologic Oncology

## 2019-03-01 DIAGNOSIS — C541 Malignant neoplasm of endometrium: Secondary | ICD-10-CM | POA: Insufficient documentation

## 2019-03-01 DIAGNOSIS — C772 Secondary and unspecified malignant neoplasm of intra-abdominal lymph nodes: Secondary | ICD-10-CM

## 2019-03-01 DIAGNOSIS — Z7189 Other specified counseling: Secondary | ICD-10-CM | POA: Insufficient documentation

## 2019-03-01 DIAGNOSIS — C775 Secondary and unspecified malignant neoplasm of intrapelvic lymph nodes: Secondary | ICD-10-CM | POA: Insufficient documentation

## 2019-03-01 LAB — CBC WITH DIFFERENTIAL/PLATELET
Abs Immature Granulocytes: 0.04 10*3/uL (ref 0.00–0.07)
Basophils Absolute: 0.1 10*3/uL (ref 0.0–0.1)
Basophils Relative: 1 %
Eosinophils Absolute: 0.2 10*3/uL (ref 0.0–0.5)
Eosinophils Relative: 3 %
HCT: 41.9 % (ref 36.0–46.0)
Hemoglobin: 13.6 g/dL (ref 12.0–15.0)
Immature Granulocytes: 1 %
Lymphocytes Relative: 18 %
Lymphs Abs: 1.4 10*3/uL (ref 0.7–4.0)
MCH: 29.9 pg (ref 26.0–34.0)
MCHC: 32.5 g/dL (ref 30.0–36.0)
MCV: 92.1 fL (ref 80.0–100.0)
Monocytes Absolute: 0.5 10*3/uL (ref 0.1–1.0)
Monocytes Relative: 7 %
Neutro Abs: 5.5 10*3/uL (ref 1.7–7.7)
Neutrophils Relative %: 70 %
Platelets: 256 10*3/uL (ref 150–400)
RBC: 4.55 MIL/uL (ref 3.87–5.11)
RDW: 13.9 % (ref 11.5–15.5)
WBC: 7.8 10*3/uL (ref 4.0–10.5)
nRBC: 0 % (ref 0.0–0.2)

## 2019-03-01 LAB — COMPREHENSIVE METABOLIC PANEL
ALT: 19 U/L (ref 0–44)
AST: 16 U/L (ref 15–41)
Albumin: 3.5 g/dL (ref 3.5–5.0)
Alkaline Phosphatase: 117 U/L (ref 38–126)
Anion gap: 9 (ref 5–15)
BUN: 13 mg/dL (ref 6–20)
CO2: 26 mmol/L (ref 22–32)
Calcium: 9.1 mg/dL (ref 8.9–10.3)
Chloride: 109 mmol/L (ref 98–111)
Creatinine, Ser: 0.8 mg/dL (ref 0.44–1.00)
GFR calc Af Amer: >60 mL/min (ref >60)
GFR calc non Af Amer: >60 mL/min (ref >60)
Glucose, Bld: 108 mg/dL — ABNORMAL HIGH (ref 70–99)
Potassium: 4.4 mmol/L (ref 3.5–5.1)
Sodium: 144 mmol/L (ref 135–145)
Total Bilirubin: 0.4 mg/dL (ref 0.3–1.2)
Total Protein: 6.9 g/dL (ref 6.5–8.1)

## 2019-03-01 MED ORDER — IOHEXOL 300 MG/ML  SOLN
100.0000 mL | Freq: Once | INTRAMUSCULAR | Status: AC | PRN
  Administered 2019-03-01: 100 mL via INTRAVENOUS

## 2019-03-01 MED ORDER — SODIUM CHLORIDE (PF) 0.9 % IJ SOLN
INTRAMUSCULAR | Status: AC
  Filled 2019-03-01: qty 50

## 2019-03-02 ENCOUNTER — Other Ambulatory Visit: Payer: Self-pay

## 2019-03-02 ENCOUNTER — Inpatient Hospital Stay (HOSPITAL_BASED_OUTPATIENT_CLINIC_OR_DEPARTMENT_OTHER): Payer: Medicare Other | Admitting: Hematology and Oncology

## 2019-03-02 ENCOUNTER — Encounter: Payer: Self-pay | Admitting: Hematology and Oncology

## 2019-03-02 DIAGNOSIS — C541 Malignant neoplasm of endometrium: Secondary | ICD-10-CM

## 2019-03-02 DIAGNOSIS — Z7189 Other specified counseling: Secondary | ICD-10-CM

## 2019-03-02 HISTORY — DX: Other specified counseling: Z71.89

## 2019-03-02 NOTE — Assessment & Plan Note (Signed)
I have reviewed the plan of care with the patient and her husband Earlier today, I also reviewed her case with her GYN surgeon CT imaging showed complete response to treatment However, she is at high risk of cancer recurrence Due to MSI high disease, she would qualify for treatment with pembrolizumab We discussed the risks, benefits, side effects of pembrolizumab including risk of acquired hypothyroidism, joint pain, immune reaction, diarrhea and infusion reaction The patient is undecided She will go home and think about it and will call me over the next few days when she has made informed decision about plan of care She would need chemo education class, blood work before we proceed with treatment Due to good venous access, she would not need port placement I would recommend 3 to 6 months of treatment we repeat interim imaging study to rule out cancer recurrence while she is on treatment

## 2019-03-02 NOTE — Progress Notes (Signed)
[place] OFFICE PROGRESS NOTE  Patient Care Team: Imagene Riches, NP as PCP - General Berniece Salines, DO as PCP - Cardiology (Cardiology) Gavin Pound, MD as Consulting Physician (Rheumatology) Awanda Mink Craige Cotta, RN as Oncology Nurse Navigator (Oncology)  ASSESSMENT & PLAN:  Endometrial cancer [facility]) I have reviewed the plan of care with the patient and her husband Earlier today, I also reviewed her case with her GYN surgeon CT imaging showed complete response to treatment However, she is at high risk of cancer recurrence Due to MSI high disease, she would qualify for treatment with pembrolizumab We discussed the risks, benefits, side effects of pembrolizumab including risk of acquired hypothyroidism, joint pain, immune reaction, diarrhea and infusion reaction The patient is undecided She will go home and think about it and will call me over the next few days when she has made informed decision about plan of care She would need chemo education class, blood work before we proceed with treatment Due to good venous access, she would not need port placement I would recommend 3 to 6 months of treatment we repeat interim imaging study to rule out cancer recurrence while she is on treatment  Goals of care, counseling/discussion We discussed goals of care Even though she have recurrent disease, she had excellent response to recent radiation therapy and currently had no signs of disease on CT imaging Even though the goals of treatment is considered palliative, I am hopeful that with immunotherapy, we can keep her in remission   No orders of the defined types were placed in this encounter.   All questions were answered. The patient knows to call the clinic with any problems, questions or concerns. The total time spent in the appointment was 40 minutes encounter with patients including review of chart and various tests results, discussions about plan of care and coordination of care  plan   Heath Lark, MD 03/02/2019 1:54 PM  INTERVAL HISTORY: Please see below for problem oriented charting. She returns for further follow-up and review of test results I also reviewed the test result with her husband over the telephone She is doing well She has completely recovered from recent radiation treatment Denies back pain No new abdominal symptoms.  SUMMARY OF ONCOLOGIC HISTORY: Oncology History Overview Note  MSI high disease detected   Endometrial cancer ([place])  07/05/2015 Pathology Results   She had abnormal PAP   11/24/2015 Procedure   She underwent endometrial sampling that came back abnormal   01/10/2016 Procedure   She had EGD which showed esophagitis   01/18/2016 Pathology Results   Diagnosis 1. Lymph node, sentinel, biopsy, right obturator - MICROMETASTASIS IN ONE OF ONE LYMPH NODES (1/1). 2. Lymph node, sentinel, biopsy, left external - ONE OF ONE LYMPH NODES NEGATIVE FOR CARCINOMA (0/1). 3. Lymph node, sentinel, biopsy, left obturator - MICROMETASTASIS IN ONE OF ONE LYMPH NODES (1/1). 4. Uterus +/- tubes/ovaries, neoplastic - UTERUS: -ENDOMYOMETRIUM: ENDOMETRIOID ADENOCARCINOMA, FIGO GRADE 1, SPANNING 3.3 CM. TUMOR INVADES LESS THAN 1/2 OF THE MYOMETRIUM. SEE ONCOLOGY TABLE. -SEROSA: UNREMARKABLE. NO MALIGNANCY IDENTIFIED. - CERVIX: BENIGN SQUAMOUS AND ENDOCERVICAL MUCOSA. NO DYSPLASIA OR MALIGNANCY. - BILATERAL OVARIES: INCLUSION CYSTS. NO MALIGNANCY. - BILATERAL FALLOPIAN TUBES: UNREMARKABLE RIGHT TUBE. LEFT TUBE IS NOT IDENTIFIED GROSSLY OR MICROSCOPICALLY.   01/18/2016 Surgery   She underwent robotic-assisted laparoscopic total hysterectomy with bilateral salpingoophorectomy, sentinel lymph node biopsy, lysis of adhesions   04/24/2016 Genetic Testing   Negative genetic testing on the TumorNext Lynch with CancerNext.  This is  paired germline and tumor analyses for enhanced diagnosis of lynch syndrome plus analyses of 29 additional genes associated  with hereditary cancer.  The CancerNext gene panel offered by [employer] includes sequencing and rearrangement analysis for the following 34 genes:   APC, ATM, BARD1, BMPR1A, BRCA1, BRCA2, BRIP1, CDH1, CDK4, CDKN2A, CHEK2, DICER, HOXB13, EPCAM, GREM1, MLH1, MRE11A, MSH2, MSH6, MUTYH, NBN, NF1, PALB2, PMS2, POLD1, POLE, PTEN, RAD50, RAD51C, RAD51D, SMAD4, SMARCA4, STK11, and TP53.   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  Taken with the germline results demonstrating absence of pathogenic mutations or likely pathogenic variants (VLPs) in the mismatch repair (MMR) genes, the likelihood that this individual has Lynch syndrome/HNPCC is greatly decreased; however, the possibility of an undetected variant of either germline or somatic origin due to mosaicism and other rare etiologies cannot be ruled out by the current methodology.  Correlation with clinical history and external tumor testing results is advised.    10/29/2018 PET scan   Outside PET scan showed enlarged aorta-caval LN   12/17/2018 - 01/15/2019 Radiation Therapy   Radiation Treatment Dates: 12/17/2018 through 01/15/2019 Site Technique Total Dose (Gy) Dose per Fx (Gy) Completed Fx Beam Energies  Abdomen: Abd_PA node 3D 50/50 2.5 20/20 6X, 15X        03/01/2019 Imaging   1. No CT findings for metastatic disease involving the chest, abdomen or pelvis. 2. Stable moderate-sized hiatal hernia. 3. Status post cholecystectomy. No biliary dilatation. 4. Stable hepatic cysts.     REVIEW OF SYSTEMS:   Constitutional: Denies fevers, chills or abnormal weight loss Eyes: Denies blurriness of vision Ears, nose, mouth, throat, and face: Denies mucositis or sore throat Respiratory: Denies cough, dyspnea or wheezes Cardiovascular: Denies palpitation, chest discomfort or lower extremity swelling Gastrointestinal:  Denies nausea, heartburn or change in bowel habits Skin: Denies abnormal skin  rashes Lymphatics: Denies new lymphadenopathy or easy bruising Neurological:Denies numbness, tingling or new weaknesses Behavioral/Psych: Mood is stable, no new changes  All other systems were reviewed with the patient and are negative.  I have reviewed the past medical history, past surgical history, social history and family history with the patient and they are unchanged from previous note.  ALLERGIES:  is allergic to codeine and sulfa antibiotics.  MEDICATIONS:  Current Outpatient Medications  Medication Sig Dispense Refill  . B Complex Vitamins (VITAMIN-B COMPLEX) TABS Take 1 tablet by mouth daily.     Marland Kitchen dicyclomine (BENTYL) 10 MG capsule Take 10 mg by mouth 2 (two) times daily as needed for spasms.     Marland Kitchen escitalopram (LEXAPRO) 20 MG tablet TAKE ONE AND ONE HALF (1.5) TABLETS BY MOUTH EVERY MORNING  1  . famotidine (PEPCID) 40 MG tablet TAKE 1 (ONE) TABLET TWICE DAILY, WITH BREAKFAST AND AT BEDTIME    . fluticasone (FLONASE) 50 MCG/ACT nasal spray INSTILL 2 SPRAYS INTO EACH NOSTRIL ONCE A DAY  0  . ketoconazole (NIZORAL) 2 % shampoo ketoconazole 2 % shampoo  APPLY TO WASH TWICE WEEKLY    . losartan (COZAAR) 25 MG tablet Take 25 mg by mouth daily.  0  . nystatin cream (MYCOSTATIN)     . pantoprazole (PROTONIX) 40 MG tablet Take 40 mg by mouth 2 (two) times daily.   11  . pravastatin (PRAVACHOL) 20 MG tablet TAKE 1 TABLET BY MOUTH DAILY    . pregabalin (LYRICA) 150 MG capsule Take 150 mg by mouth 2 (two) times daily.    . prochlorperazine (COMPAZINE) 10  MG tablet Take 1 tablet (10 mg total) by mouth every 6 (six) hours as needed for nausea or vomiting. 30 tablet 0  . solifenacin (VESICARE) 10 MG tablet Vesicare 10 mg tablet  TAKE 1 TABLET BY MOUTH EVERY DAY     No current facility-administered medications for this visit.    PHYSICAL EXAMINATION: ECOG PERFORMANCE STATUS: 0 - Asymptomatic  Vitals:   03/02/19 1217  BP: 122/77  Pulse: 76  Resp: 18  Temp: 98.5 F (36.9 C)   SpO2: 97%   Filed Weights   03/02/19 1217  Weight: 205 lb 12.8 oz (93.4 kg)    GENERAL:alert, no distress and comfortable NEURO: alert & oriented x 3 with fluent speech, no focal motor/sensory deficits  LABORATORY DATA:  I have reviewed the data as listed    Component Value Date/Time   NA 144 03/01/2019 1115   NA 142 10/06/2018 1101   K 4.4 03/01/2019 1115   CL 109 03/01/2019 1115   CO2 26 03/01/2019 1115   GLUCOSE 108 (H) 03/01/2019 1115   BUN 13 03/01/2019 1115   BUN 14 10/06/2018 1101   CREATININE 0.80 03/01/2019 1115   CREATININE 0.87 12/06/2015 1022   CALCIUM 9.1 03/01/2019 1115   PROT 6.9 03/01/2019 1115   ALBUMIN 3.5 03/01/2019 1115   AST 16 03/01/2019 1115   ALT 19 03/01/2019 1115   ALKPHOS 117 03/01/2019 1115   BILITOT 0.4 03/01/2019 1115   GFRNONAA >60 03/01/2019 1115   GFRAA >60 03/01/2019 1115    No results found for: SPEP, UPEP  Lab Results  Component Value Date   WBC 7.8 03/01/2019   NEUTROABS 5.5 03/01/2019   HGB 13.6 03/01/2019   HCT 41.9 03/01/2019   MCV 92.1 03/01/2019   PLT 256 03/01/2019      Chemistry      Component Value Date/Time   NA 144 03/01/2019 1115   NA 142 10/06/2018 1101   K 4.4 03/01/2019 1115   CL 109 03/01/2019 1115   CO2 26 03/01/2019 1115   BUN 13 03/01/2019 1115   BUN 14 10/06/2018 1101   CREATININE 0.80 03/01/2019 1115   CREATININE 0.87 12/06/2015 1022      Component Value Date/Time   CALCIUM 9.1 03/01/2019 1115   ALKPHOS 117 03/01/2019 1115   AST 16 03/01/2019 1115   ALT 19 03/01/2019 1115   BILITOT 0.4 03/01/2019 1115       RADIOGRAPHIC STUDIES: I have reviewed multiple CT imaging with the patient I have personally reviewed the radiological images as listed and agreed with the findings in the report. CT Chest W Contrast  Result Date: 03/01/2019 CLINICAL DATA:  Restaging endometrial cancer. Initial diagnosis January 2018. EXAM: CT CHEST, ABDOMEN, AND PELVIS WITH CONTRAST TECHNIQUE: Multidetector CT  imaging of the chest, abdomen and pelvis was performed following the standard protocol during bolus administration of intravenous contrast. CONTRAST:  152m OMNIPAQUE IOHEXOL 300 MG/ML  SOLN COMPARISON:  PET-CT 11/02/2018 FINDINGS: CT CHEST FINDINGS Cardiovascular: The heart is normal in size. No pericardial effusion. The aorta is normal in caliber. No dissection. No atherosclerotic calcifications. The branch vessels are patent. No definite coronary artery calcifications. The pulmonary arteries are grossly normal. Mediastinum/Nodes: No mediastinal or hilar mass or adenopathy. Small scattered lymph nodes are stable. Stable moderate-sized hiatal hernia. Lungs/Pleura: The lungs are clear of an acute process. No worrisome pulmonary lesions. No pulmonary nodules to suggest metastatic disease. Musculoskeletal: No breast masses, supraclavicular or axillary adenopathy. No breast masses. The thyroid gland appears  normal. No significant bony findings. CT ABDOMEN PELVIS FINDINGS Hepatobiliary: Small left hepatic lobe cysts are stable. No worrisome hepatic lesions or intrahepatic biliary dilatation. The gallbladder is surgically absent. No common bile duct dilatation. Pancreas: No mass, inflammation or ductal dilatation. Spleen: Normal size. No focal lesions. Adrenals/Urinary Tract: The adrenal glands and kidneys are unremarkable. The bladder is unremarkable. Stomach/Bowel: The stomach, duodenum, small bowel and colon are unremarkable. No acute inflammatory changes, mass lesions or obstructive findings. The terminal ileum is normal. The appendix is normal. Colonic diverticulosis and evidence of prior sigmoid colon surgery but no acute abnormality. Vascular/Lymphatic: The aorta is normal in caliber. No dissection. The branch vessels are patent. The major venous structures are patent. No mesenteric or retroperitoneal mass or adenopathy. Small scattered lymph nodes are noted. Reproductive: Surgically absent. No pelvic mass or  pelvic adenopathy. No omental or peritoneal surface disease. Other: No pelvic mass or adenopathy. No free pelvic fluid collections. No inguinal mass or adenopathy. No abdominal wall hernia or subcutaneous lesions. Musculoskeletal: No significant bony findings. IMPRESSION: 1. No CT findings for metastatic disease involving the chest, abdomen or pelvis. 2. Stable moderate-sized hiatal hernia. 3. Status post cholecystectomy. No biliary dilatation. 4. Stable hepatic cysts. Electronically Signed   By: Marijo Sanes M.D.   On: 03/01/2019 14:47   CT Abdomen Pelvis W Contrast  Result Date: 03/01/2019 CLINICAL DATA:  Restaging endometrial cancer. Initial diagnosis January 2018. EXAM: CT CHEST, ABDOMEN, AND PELVIS WITH CONTRAST TECHNIQUE: Multidetector CT imaging of the chest, abdomen and pelvis was performed following the standard protocol during bolus administration of intravenous contrast. CONTRAST:  134m OMNIPAQUE IOHEXOL 300 MG/ML  SOLN COMPARISON:  PET-CT 11/02/2018 FINDINGS: CT CHEST FINDINGS Cardiovascular: The heart is normal in size. No pericardial effusion. The aorta is normal in caliber. No dissection. No atherosclerotic calcifications. The branch vessels are patent. No definite coronary artery calcifications. The pulmonary arteries are grossly normal. Mediastinum/Nodes: No mediastinal or hilar mass or adenopathy. Small scattered lymph nodes are stable. Stable moderate-sized hiatal hernia. Lungs/Pleura: The lungs are clear of an acute process. No worrisome pulmonary lesions. No pulmonary nodules to suggest metastatic disease. Musculoskeletal: No breast masses, supraclavicular or axillary adenopathy. No breast masses. The thyroid gland appears normal. No significant bony findings. CT ABDOMEN PELVIS FINDINGS Hepatobiliary: Small left hepatic lobe cysts are stable. No worrisome hepatic lesions or intrahepatic biliary dilatation. The gallbladder is surgically absent. No common bile duct dilatation. Pancreas: No  mass, inflammation or ductal dilatation. Spleen: Normal size. No focal lesions. Adrenals/Urinary Tract: The adrenal glands and kidneys are unremarkable. The bladder is unremarkable. Stomach/Bowel: The stomach, duodenum, small bowel and colon are unremarkable. No acute inflammatory changes, mass lesions or obstructive findings. The terminal ileum is normal. The appendix is normal. Colonic diverticulosis and evidence of prior sigmoid colon surgery but no acute abnormality. Vascular/Lymphatic: The aorta is normal in caliber. No dissection. The branch vessels are patent. The major venous structures are patent. No mesenteric or retroperitoneal mass or adenopathy. Small scattered lymph nodes are noted. Reproductive: Surgically absent. No pelvic mass or pelvic adenopathy. No omental or peritoneal surface disease. Other: No pelvic mass or adenopathy. No free pelvic fluid collections. No inguinal mass or adenopathy. No abdominal wall hernia or subcutaneous lesions. Musculoskeletal: No significant bony findings. IMPRESSION: 1. No CT findings for metastatic disease involving the chest, abdomen or pelvis. 2. Stable moderate-sized hiatal hernia. 3. Status post cholecystectomy. No biliary dilatation. 4. Stable hepatic cysts. Electronically Signed   By: PRicky StabsD.  On: 03/01/2019 14:47

## 2019-03-02 NOTE — Assessment & Plan Note (Signed)
We discussed goals of care Even though she have recurrent disease, she had excellent response to recent radiation therapy and currently had no signs of disease on CT imaging Even though the goals of treatment is considered palliative, I am hopeful that with immunotherapy, we can keep her in remission

## 2019-03-03 ENCOUNTER — Telehealth: Payer: Self-pay

## 2019-03-03 NOTE — Telephone Encounter (Signed)
She called and left a message to call her.  Called back. She would like to get the [facility]. She wants to get her treatment a [facility] because it is closer. She is asking if Dr. Alvy Bimler would still be her MD?

## 2019-03-04 NOTE — Telephone Encounter (Signed)
Kinyatta called back and said her husband wants to bring her for the treatments in [place].  She said she does not want to change doctors and said everyone knows her here.  She does not want the referral for the transportation program.  Advised her that Dr. Alvy Bimler will be notified and that the schedulers will call her with her appointments.

## 2019-03-04 NOTE — Telephone Encounter (Signed)
[facility] and discussed that Dr. Alvy Bimler does not have practice privileges at [facility] and that she would need to see another doctor there to have treatment.  She said it is difficult for her to travel to [place] because her husband and son need to wait in the car for her.  Asked if she would be interested in a referral to the transportation program and she said she would.  Advised her I will call her back to see if she qualifies.

## 2019-03-04 NOTE — Telephone Encounter (Signed)
Santiago Glad,  Please call her back and verify We referred her to [place] before and then she came back I do not have practice privilege at [place] but she can get treatment through other [place]

## 2019-03-05 ENCOUNTER — Telehealth: Payer: Self-pay | Admitting: Hematology and Oncology

## 2019-03-05 ENCOUNTER — Other Ambulatory Visit: Payer: Self-pay | Admitting: Hematology and Oncology

## 2019-03-05 DIAGNOSIS — C801 Malignant (primary) neoplasm, unspecified: Secondary | ICD-10-CM | POA: Insufficient documentation

## 2019-03-05 DIAGNOSIS — Z7189 Other specified counseling: Secondary | ICD-10-CM

## 2019-03-05 DIAGNOSIS — C541 Malignant neoplasm of endometrium: Secondary | ICD-10-CM

## 2019-03-05 DIAGNOSIS — C772 Secondary and unspecified malignant neoplasm of intra-abdominal lymph nodes: Secondary | ICD-10-CM

## 2019-03-05 HISTORY — DX: Malignant (primary) neoplasm, unspecified: C80.1

## 2019-03-05 MED ORDER — PROCHLORPERAZINE MALEATE 10 MG PO TABS: 10.0000 mg | ORAL_TABLET | Freq: Four times a day (QID) | ORAL | 1 refills | Status: DC | PRN

## 2019-03-05 MED ORDER — ONDANSETRON HCL 8 MG PO TABS: 8.0000 mg | ORAL_TABLET | Freq: Two times a day (BID) | ORAL | 1 refills | Status: DC | PRN

## 2019-03-05 NOTE — Telephone Encounter (Signed)
Called and given below message. She verbalized understanding. 

## 2019-03-05 NOTE — Telephone Encounter (Signed)
I did not see she had chemo class I plan to schedule labs, chemo class, see me and [place] treatment on 3/8 She can bring her husband or 1 family if needed

## 2019-03-05 NOTE — Progress Notes (Signed)
START OFF PATHWAY REGIMEN - Uterine   [id number]:Pembrolizumab 200 mg q21 Days:   A cycle is 21 days:     Pembrolizumab   **Always confirm dose/schedule in your pharmacy ordering system**  Patient Characteristics: Endometrioid, Recurrent/Progressive Disease, Second Line, Systemic Recurrence, No Previous Chemotherapy Histology: Endometrioid Therapeutic Status: Recurrent or Progressive Disease Line of Therapy: Second Line Time to Recurrence: No Previous Chemotherapy Intent of Therapy: Non-Curative / Palliative Intent, Discussed with Patient

## 2019-03-05 NOTE — Telephone Encounter (Signed)
Scheduled appt per 2/26 sch message - pt aware of appt date and time

## 2019-03-12 ENCOUNTER — Other Ambulatory Visit: Payer: Self-pay | Admitting: Hematology and Oncology

## 2019-03-15 ENCOUNTER — Other Ambulatory Visit: Payer: Self-pay

## 2019-03-15 ENCOUNTER — Inpatient Hospital Stay: Payer: Medicare Other

## 2019-03-15 ENCOUNTER — Encounter: Payer: Self-pay | Admitting: Hematology and Oncology

## 2019-03-15 ENCOUNTER — Inpatient Hospital Stay (HOSPITAL_BASED_OUTPATIENT_CLINIC_OR_DEPARTMENT_OTHER): Payer: Medicare Other | Admitting: Hematology and Oncology

## 2019-03-15 ENCOUNTER — Inpatient Hospital Stay: Payer: Medicare Other | Attending: Gynecologic Oncology

## 2019-03-15 DIAGNOSIS — Z5112 Encounter for antineoplastic immunotherapy: Secondary | ICD-10-CM | POA: Insufficient documentation

## 2019-03-15 DIAGNOSIS — Z79899 Other long term (current) drug therapy: Secondary | ICD-10-CM | POA: Diagnosis not present

## 2019-03-15 DIAGNOSIS — C541 Malignant neoplasm of endometrium: Secondary | ICD-10-CM

## 2019-03-15 DIAGNOSIS — Z7189 Other specified counseling: Secondary | ICD-10-CM

## 2019-03-15 LAB — CMP (CANCER CENTER ONLY)
ALT: 23 U/L (ref 0–44)
AST: 18 U/L (ref 15–41)
Albumin: 3.6 g/dL (ref 3.5–5.0)
Alkaline Phosphatase: 134 U/L — ABNORMAL HIGH (ref 38–126)
Anion gap: 9 (ref 5–15)
BUN: 16 mg/dL (ref 6–20)
CO2: 24 mmol/L (ref 22–32)
Calcium: 9.3 mg/dL (ref 8.9–10.3)
Chloride: 110 mmol/L (ref 98–111)
Creatinine: 0.79 mg/dL (ref 0.44–1.00)
GFR, Est AFR Am: >60 mL/min (ref >60)
GFR, Estimated: >60 mL/min (ref >60)
Glucose, Bld: 117 mg/dL — ABNORMAL HIGH (ref 70–99)
Potassium: 4.1 mmol/L (ref 3.5–5.1)
Sodium: 143 mmol/L (ref 135–145)
Total Bilirubin: 0.3 mg/dL (ref 0.3–1.2)
Total Protein: 7 g/dL (ref 6.5–8.1)

## 2019-03-15 LAB — TSH: TSH: 3.672 u[IU]/mL (ref 0.308–3.960)

## 2019-03-15 LAB — CBC WITH DIFFERENTIAL (CANCER CENTER ONLY)
Abs Immature Granulocytes: 0.03 10*3/uL (ref 0.00–0.07)
Basophils Absolute: 0.1 10*3/uL (ref 0.0–0.1)
Basophils Relative: 1 %
Eosinophils Absolute: 0.3 10*3/uL (ref 0.0–0.5)
Eosinophils Relative: 3 %
HCT: 41.1 % (ref 36.0–46.0)
Hemoglobin: 13.6 g/dL (ref 12.0–15.0)
Immature Granulocytes: 0 %
Lymphocytes Relative: 18 %
Lymphs Abs: 1.6 10*3/uL (ref 0.7–4.0)
MCH: 30.3 pg (ref 26.0–34.0)
MCHC: 33.1 g/dL (ref 30.0–36.0)
MCV: 91.5 fL (ref 80.0–100.0)
Monocytes Absolute: 0.6 10*3/uL (ref 0.1–1.0)
Monocytes Relative: 7 %
Neutro Abs: 6.2 10*3/uL (ref 1.7–7.7)
Neutrophils Relative %: 71 %
Platelet Count: 249 10*3/uL (ref 150–400)
RBC: 4.49 MIL/uL (ref 3.87–5.11)
RDW: 14.2 % (ref 11.5–15.5)
WBC Count: 8.8 10*3/uL (ref 4.0–10.5)
nRBC: 0 % (ref 0.0–0.2)

## 2019-03-15 MED ORDER — SODIUM CHLORIDE 0.9 % IV SOLN
Freq: Once | INTRAVENOUS | Status: AC
  Filled 2019-03-15: qty 250

## 2019-03-15 MED ORDER — SODIUM CHLORIDE 0.9 % IV SOLN
200.0000 mg | Freq: Once | INTRAVENOUS | Status: AC
  Administered 2019-03-15: 200 mg via INTRAVENOUS
  Filled 2019-03-15: qty 8

## 2019-03-15 NOTE — Progress Notes (Signed)
Met with patient in treatment area to introduce myself as Arboriculturist and to offer available resources.  Discussed one-time $1000 Radio broadcast assistant to assist with personal expenses while going through treatment.  Gave her my card if interested in applying and for any additional financial questions or concerns.

## 2019-03-15 NOTE — Patient Instructions (Signed)
COVID-19 Vaccine Information can be found at: [email or web address] For questions related to vaccine distribution or appointments, please email [email or web address] or call [phone].   [facility] Discharge Instructions for Patients Receiving Immunotherapy  Today you received the following immunotherapy agents: Pembrolizumab Beryle Flock)  To help prevent nausea and vomiting after your treatment, we encourage you to take your nausea medication as directed by your provider.   If you develop nausea and vomiting that is not controlled by your nausea medication, call the clinic.   BELOW ARE SYMPTOMS THAT SHOULD BE REPORTED IMMEDIATELY:  *FEVER GREATER THAN 100.5 F  *CHILLS WITH OR WITHOUT FEVER  NAUSEA AND VOMITING THAT IS NOT CONTROLLED WITH YOUR NAUSEA MEDICATION  *UNUSUAL SHORTNESS OF BREATH  *UNUSUAL BRUISING OR BLEEDING  TENDERNESS IN MOUTH AND THROAT WITH OR WITHOUT PRESENCE OF ULCERS  *URINARY PROBLEMS  *BOWEL PROBLEMS  UNUSUAL RASH Items with * indicate a potential emergency and should be followed up as soon as possible.  Feel free to call the clinic should you have any questions or concerns. The clinic phone number is [phone].  Please show the [facility] at check-in to the Emergency Department and triage nurse.  Pembrolizumab injection What is this medicine? PEMBROLIZUMAB (pem broe liz ue mab) is a monoclonal antibody. It is used to treat certain types of cancer. This medicine may be used for other purposes; ask your health care provider or pharmacist if you have questions. COMMON BRAND NAME(S): Keytruda What should I tell my health care provider before I take this medicine? They need to know if you have any of these conditions:  diabetes  immune system problems  inflammatory bowel disease  liver disease  lung or breathing disease  lupus  received or scheduled to receive  an organ transplant or a stem-cell transplant that uses donor stem cells  an unusual or allergic reaction to pembrolizumab, other medicines, foods, dyes, or preservatives  pregnant or trying to get pregnant  breast-feeding How should I use this medicine? This medicine is for infusion into a vein. It is given by a health care professional in a hospital or clinic setting. A special MedGuide will be given to you before each treatment. Be sure to read this information carefully each time. Talk to your pediatrician regarding the use of this medicine in children. While this drug may be prescribed for children as young as 6 months for selected conditions, precautions do apply. Overdosage: If you think you have taken too much of this medicine contact a poison control center or emergency room at once. NOTE: This medicine is only for you. Do not share this medicine with others. What if I miss a dose? It is important not to miss your dose. Call your doctor or health care professional if you are unable to keep an appointment. What may interact with this medicine? Interactions have not been studied. Give your health care provider a list of all the medicines, herbs, non-prescription drugs, or dietary supplements you use. Also tell them if you smoke, drink alcohol, or use illegal drugs. Some items may interact with your medicine. This list may not describe all possible interactions. Give your health care provider a list of all the medicines, herbs, non-prescription drugs, or dietary supplements you use. Also tell them if you smoke, drink alcohol, or use illegal drugs. Some items may interact with your medicine. What should I watch for while using this medicine? Your condition will be monitored carefully while you are receiving this medicine. You  may need blood work done while you are taking this medicine. Do not become pregnant while taking this medicine or for 4 months after stopping it. Women should inform  their doctor if they wish to become pregnant or think they might be pregnant. There is a potential for serious side effects to an unborn child. Talk to your health care professional or pharmacist for more information. Do not breast-feed an infant while taking this medicine or for 4 months after the last dose. What side effects may I notice from receiving this medicine? Side effects that you should report to your doctor or health care professional as soon as possible:  allergic reactions like skin rash, itching or hives, swelling of the face, lips, or tongue  bloody or black, tarry  breathing problems  changes in vision  chest pain  chills  confusion  constipation  cough  diarrhea  dizziness or feeling faint or lightheaded  fast or irregular heartbeat  fever  flushing  joint pain  low blood counts - this medicine may decrease the number of white blood cells, red blood cells and platelets. You may be at increased risk for infections and bleeding.  muscle pain  muscle weakness  pain, tingling, numbness in the hands or feet  persistent headache  redness, blistering, peeling or loosening of the skin, including inside the mouth  signs and symptoms of high blood sugar such as dizziness; dry mouth; dry skin; fruity breath; nausea; stomach pain; increased hunger or thirst; increased urination  signs and symptoms of kidney injury like trouble passing urine or change in the amount of urine  signs and symptoms of liver injury like dark urine, light-colored stools, loss of appetite, nausea, right upper belly pain, yellowing of the eyes or skin  sweating  swollen lymph nodes  weight loss Side effects that usually do not require medical attention (report to your doctor or health care professional if they continue or are bothersome):  decreased appetite  hair loss  muscle pain  tiredness This list may not describe all possible side effects. Call your doctor for  medical advice about side effects. You may report side effects to FDA at 1-800-FDA-1088. Where should I keep my medicine? This drug is given in a hospital or clinic and will not be stored at home. NOTE: This sheet is a summary. It may not cover all possible information. If you have questions about this medicine, talk to your doctor, pharmacist, or health care provider.  2020 Elsevier/Gold Standard (2018-10-30 18:07:58)  Coronavirus (COVID-19) Are you at risk?  Are you at risk for the Coronavirus (COVID-19)?  To be considered HIGH RISK for Coronavirus (COVID-19), you have to meet the following criteria:  . Traveled to [place], [place], [place], [place] or [place]; or in the [place] to [place], [place], [place], or [place]; and have fever, cough, and shortness of breath within the last 2 weeks of travel OR . Been in close contact with a person diagnosed with COVID-19 within the last 2 weeks and have fever, cough, and shortness of breath . IF YOU DO NOT MEET THESE CRITERIA, YOU ARE CONSIDERED LOW RISK FOR COVID-19.  What to do if you are HIGH RISK for COVID-19?  Marland Kitchen If you are having a medical emergency, call 911. . Seek medical care right away. Before you go to a doctor's office, urgent care or emergency department, call ahead and tell them about your recent travel, contact with someone diagnosed with COVID-19, and your symptoms.  You should receive instructions from your physician's office regarding next steps of care.  . When you arrive at healthcare provider, tell the healthcare staff immediately you have returned from visiting [place], [place], [place], [place] or [place]; or traveled in the [place] to [place], [place], [place], or [place]; in the last two weeks or you have been in close contact with a person diagnosed with COVID-19 in the last 2 weeks.   . Tell the health care staff about your symptoms: fever, cough and shortness of breath. . After you have been seen by  a medical provider, you will be either: o Tested for (COVID-19) and discharged home on quarantine except to seek medical care if symptoms worsen, and asked to  - Stay home and avoid contact with others until you get your results (4-5 days)  - Avoid travel on public transportation if possible (such as bus, train, or airplane) or o Sent to the Emergency Department by EMS for evaluation, COVID-19 testing, and possible admission depending on your condition and test results.  What to do if you are LOW RISK for COVID-19?  Reduce your risk of any infection by using the same precautions used for avoiding the common cold or flu:  Marland Kitchen Wash your hands often with soap and warm water for at least 20 seconds.  If soap and water are not readily available, use an alcohol-based hand sanitizer with at least 60% alcohol.  . If coughing or sneezing, cover your mouth and nose by coughing or sneezing into the elbow areas of your shirt or coat, into a tissue or into your sleeve (not your hands). . Avoid shaking hands with others and consider head nods or verbal greetings only. . Avoid touching your eyes, nose, or mouth with unwashed hands.  . Avoid close contact with people who are sick. . Avoid places or events with large numbers of people in one location, like concerts or sporting events. . Carefully consider travel plans you have or are making. . If you are planning any travel outside or inside the [place], visit the CDC's Travelers' Health webpage for the latest health notices. . If you have some symptoms but not all symptoms, continue to monitor at home and seek medical attention if your symptoms worsen. . If you are having a medical emergency, call 911.   [place] / e-Visit: [email or web address]         [facility]: [facility]: [id number]                   [facility]: [phone]

## 2019-03-16 ENCOUNTER — Telehealth: Payer: Self-pay

## 2019-03-16 ENCOUNTER — Encounter: Payer: Self-pay | Admitting: Hematology and Oncology

## 2019-03-16 NOTE — Telephone Encounter (Signed)
-----   Message from Zola Button, RN sent at 03/15/2019  1:20 PM EST ----- Regarding: Dr. Alvy Bimler; First Time F/U Call Patient received first time Keytruda on 03/15/19. Tolerated well. Thank you!

## 2019-03-16 NOTE — Telephone Encounter (Signed)
Called and given below message message. She verbalized understanding. Offered lab appt this afternoon at [facility] also. She declined offer of lab appt. She will call PCP to be evaluated. Instructed to call the office back if needed.

## 2019-03-16 NOTE — Telephone Encounter (Signed)
She called back. She has appt with PCP today at 2:40 pm to be evaluated. She just went to the bathroom to void and she had blood on the tissue. Instructed to keep appt with PCP and drink fluids. Instructed to call the office in the am with update. She verbalized understanding.

## 2019-03-16 NOTE — Assessment & Plan Note (Signed)
We discussed the goals of care With recurrent disease, most people are not curative but in her situation, her recent CT imaging showed no evidence of disease However, she is at high risk of relapse She understood the role of treatment in her situation

## 2019-03-16 NOTE — Telephone Encounter (Signed)
I do not recommend treating her without urine sample I suggest either urgent care or call PCP for evaluation

## 2019-03-16 NOTE — Progress Notes (Signed)
[facility] OFFICE PROGRESS NOTE  Patient Care Team: Imagene Riches, NP as PCP - General Berniece Salines, DO as PCP - Cardiology (Cardiology) Gavin Pound, MD as Consulting Physician (Rheumatology) Awanda Mink Craige Cotta, RN as Oncology Nurse Navigator (Oncology)  ASSESSMENT & PLAN:  Endometrial cancer [facility]) We reviewed the plan of care again She is willing to proceed We discussed briefly some of the expected side effects of treatment I recommend minimum 4 to 6 months of treatment I will see her again in 3 weeks for further follow-up  Goals of care, counseling/discussion We discussed the goals of care With recurrent disease, most people are not curative but in her situation, her recent CT imaging showed no evidence of disease However, she is at high risk of relapse She understood the role of treatment in her situation   No orders of the defined types were placed in this encounter.   All questions were answered. The patient knows to call the clinic with any problems, questions or concerns. The total time spent in the appointment was 20 minutes encounter with patients including review of chart and various tests results, discussions about plan of care and coordination of care plan   Heath Lark, MD 03/16/2019 10:58 AM  INTERVAL HISTORY: Please see below for problem oriented charting. She returns for further follow-up She is very concerned about side effects of treatment She feels well No abdominal pain no recent changes in bowel habits  SUMMARY OF ONCOLOGIC HISTORY: Oncology History Overview Note  MSI high disease detected   Endometrial cancer ([place])  07/05/2015 Pathology Results   She had abnormal PAP   11/24/2015 Procedure   She underwent endometrial sampling that came back abnormal   01/10/2016 Procedure   She had EGD which showed esophagitis   01/18/2016 Pathology Results   Diagnosis 1. Lymph node, sentinel, biopsy, right obturator - MICROMETASTASIS IN ONE OF ONE  LYMPH NODES (1/1). 2. Lymph node, sentinel, biopsy, left external - ONE OF ONE LYMPH NODES NEGATIVE FOR CARCINOMA (0/1). 3. Lymph node, sentinel, biopsy, left obturator - MICROMETASTASIS IN ONE OF ONE LYMPH NODES (1/1). 4. Uterus +/- tubes/ovaries, neoplastic - UTERUS: -ENDOMYOMETRIUM: ENDOMETRIOID ADENOCARCINOMA, FIGO GRADE 1, SPANNING 3.3 CM. TUMOR INVADES LESS THAN 1/2 OF THE MYOMETRIUM. SEE ONCOLOGY TABLE. -SEROSA: UNREMARKABLE. NO MALIGNANCY IDENTIFIED. - CERVIX: BENIGN SQUAMOUS AND ENDOCERVICAL MUCOSA. NO DYSPLASIA OR MALIGNANCY. - BILATERAL OVARIES: INCLUSION CYSTS. NO MALIGNANCY. - BILATERAL FALLOPIAN TUBES: UNREMARKABLE RIGHT TUBE. LEFT TUBE IS NOT IDENTIFIED GROSSLY OR MICROSCOPICALLY.   01/18/2016 Surgery   She underwent robotic-assisted laparoscopic total hysterectomy with bilateral salpingoophorectomy, sentinel lymph node biopsy, lysis of adhesions   04/24/2016 Genetic Testing   Negative genetic testing on the TumorNext Lynch with CancerNext.  This is paired germline and tumor analyses for enhanced diagnosis of lynch syndrome plus analyses of 29 additional genes associated with hereditary cancer.  The CancerNext gene panel offered by [employer] includes sequencing and rearrangement analysis for the following 34 genes:   APC, ATM, BARD1, BMPR1A, BRCA1, BRCA2, BRIP1, CDH1, CDK4, CDKN2A, CHEK2, DICER, HOXB13, EPCAM, GREM1, MLH1, MRE11A, MSH2, MSH6, MUTYH, NBN, NF1, PALB2, PMS2, POLD1, POLE, PTEN, RAD50, RAD51C, RAD51D, SMAD4, SMARCA4, STK11, and TP53.   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  Taken with the germline results demonstrating absence of pathogenic mutations or likely pathogenic variants (VLPs) in the mismatch repair (MMR) genes, the likelihood that this individual has Lynch syndrome/HNPCC is greatly decreased; however, the possibility of an undetected  variant of either germline or somatic origin due to mosaicism and  other rare etiologies cannot be ruled out by the current methodology.  Correlation with clinical history and external tumor testing results is advised.    10/29/2018 PET scan   Outside PET scan showed enlarged aorta-caval LN   12/17/2018 - 01/15/2019 Radiation Therapy   Radiation Treatment Dates: 12/17/2018 through 01/15/2019 Site Technique Total Dose (Gy) Dose per Fx (Gy) Completed Fx Beam Energies  Abdomen: Abd_PA node 3D 50/50 2.5 20/20 6X, 15X        03/01/2019 Imaging   1. No CT findings for metastatic disease involving the chest, abdomen or pelvis. 2. Stable moderate-sized hiatal hernia. 3. Status post cholecystectomy. No biliary dilatation. 4. Stable hepatic cysts.   03/15/2019 -  Chemotherapy   The patient had pembrolizumab (KEYTRUDA) 200 mg in sodium chloride 0.9 % 50 mL chemo infusion, 200 mg, Intravenous, Once, 1 of 6 cycles Administration: 200 mg (03/15/2019)  for chemotherapy treatment.      REVIEW OF SYSTEMS:   Constitutional: Denies fevers, chills or abnormal weight loss Eyes: Denies blurriness of vision Ears, nose, mouth, throat, and face: Denies mucositis or sore throat Respiratory: Denies cough, dyspnea or wheezes Cardiovascular: Denies palpitation, chest discomfort or lower extremity swelling Gastrointestinal:  Denies nausea, heartburn or change in bowel habits Skin: Denies abnormal skin rashes Lymphatics: Denies new lymphadenopathy or easy bruising Neurological:Denies numbness, tingling or new weaknesses Behavioral/Psych: Mood is stable, no new changes  All other systems were reviewed with the patient and are negative.  I have reviewed the past medical history, past surgical history, social history and family history with the patient and they are unchanged from previous note.  ALLERGIES:  is allergic to codeine and sulfa antibiotics.  MEDICATIONS:  Current Outpatient Medications  Medication Sig Dispense Refill  . B Complex Vitamins (VITAMIN-B COMPLEX) TABS  Take 1 tablet by mouth daily.     Marland Kitchen dicyclomine (BENTYL) 10 MG capsule Take 10 mg by mouth 2 (two) times daily as needed for spasms.     Marland Kitchen escitalopram (LEXAPRO) 20 MG tablet TAKE ONE AND ONE HALF (1.5) TABLETS BY MOUTH EVERY MORNING  1  . famotidine (PEPCID) 40 MG tablet TAKE 1 (ONE) TABLET TWICE DAILY, WITH BREAKFAST AND AT BEDTIME    . fluticasone (FLONASE) 50 MCG/ACT nasal spray INSTILL 2 SPRAYS INTO EACH NOSTRIL ONCE A DAY  0  . ketoconazole (NIZORAL) 2 % shampoo ketoconazole 2 % shampoo  APPLY TO WASH TWICE WEEKLY    . losartan (COZAAR) 25 MG tablet Take 25 mg by mouth daily.  0  . nystatin cream (MYCOSTATIN)     . ondansetron (ZOFRAN) 8 MG tablet Take 1 tablet (8 mg total) by mouth 2 (two) times daily as needed (Nausea or vomiting). 30 tablet 1  . pantoprazole (PROTONIX) 40 MG tablet Take 40 mg by mouth 2 (two) times daily.   11  . pravastatin (PRAVACHOL) 20 MG tablet TAKE 1 TABLET BY MOUTH DAILY    . pregabalin (LYRICA) 150 MG capsule Take 150 mg by mouth 2 (two) times daily.    . prochlorperazine (COMPAZINE) 10 MG tablet Take 1 tablet (10 mg total) by mouth every 6 (six) hours as needed for nausea or vomiting. 30 tablet 0  . prochlorperazine (COMPAZINE) 10 MG tablet Take 1 tablet (10 mg total) by mouth every 6 (six) hours as needed (Nausea or vomiting). 30 tablet 1  . rosuvastatin (CRESTOR) 40 MG tablet Take 40 mg by mouth daily.    Marland Kitchen  solifenacin (VESICARE) 10 MG tablet Vesicare 10 mg tablet  TAKE 1 TABLET BY MOUTH EVERY DAY     No current facility-administered medications for this visit.    PHYSICAL EXAMINATION: ECOG PERFORMANCE STATUS: 0 - Asymptomatic  Vitals:   03/15/19 1038  BP: 130/69  Pulse: 71  Resp: 18  Temp: 98.9 F (37.2 C)  SpO2: 97%   Filed Weights   03/15/19 1038  Weight: 209 lb 14.4 oz (95.2 kg)    GENERAL:alert, no distress and comfortable SKIN: skin color, texture, turgor are normal, no rashes or significant lesions EYES: normal, Conjunctiva are pink  and non-injected, sclera clear OROPHARYNX:no exudate, no erythema and lips, buccal mucosa, and tongue normal  NECK: supple, thyroid normal size, non-tender, without nodularity LYMPH:  no palpable lymphadenopathy in the cervical, axillary or inguinal LUNGS: clear to auscultation and percussion with normal breathing effort HEART: regular rate & rhythm and no murmurs and no lower extremity edema ABDOMEN:abdomen soft, non-tender and normal bowel sounds Musculoskeletal:no cyanosis of digits and no clubbing  NEURO: alert & oriented x 3 with fluent speech, no focal motor/sensory deficits  LABORATORY DATA:  I have reviewed the data as listed    Component Value Date/Time   NA 143 03/15/2019 0959   NA 142 10/06/2018 1101   K 4.1 03/15/2019 0959   CL 110 03/15/2019 0959   CO2 24 03/15/2019 0959   GLUCOSE 117 (H) 03/15/2019 0959   BUN 16 03/15/2019 0959   BUN 14 10/06/2018 1101   CREATININE 0.79 03/15/2019 0959   CREATININE 0.87 12/06/2015 1022   CALCIUM 9.3 03/15/2019 0959   PROT 7.0 03/15/2019 0959   ALBUMIN 3.6 03/15/2019 0959   AST 18 03/15/2019 0959   ALT 23 03/15/2019 0959   ALKPHOS 134 (H) 03/15/2019 0959   BILITOT 0.3 03/15/2019 0959   GFRNONAA >60 03/15/2019 0959   GFRAA >60 03/15/2019 0959    No results found for: SPEP, UPEP  Lab Results  Component Value Date   WBC 8.8 03/15/2019   NEUTROABS 6.2 03/15/2019   HGB 13.6 03/15/2019   HCT 41.1 03/15/2019   MCV 91.5 03/15/2019   PLT 249 03/15/2019      Chemistry      Component Value Date/Time   NA 143 03/15/2019 0959   NA 142 10/06/2018 1101   K 4.1 03/15/2019 0959   CL 110 03/15/2019 0959   CO2 24 03/15/2019 0959   BUN 16 03/15/2019 0959   BUN 14 10/06/2018 1101   CREATININE 0.79 03/15/2019 0959   CREATININE 0.87 12/06/2015 1022      Component Value Date/Time   CALCIUM 9.3 03/15/2019 0959   ALKPHOS 134 (H) 03/15/2019 0959   AST 18 03/15/2019 0959   ALT 23 03/15/2019 0959   BILITOT 0.3 03/15/2019 0959        RADIOGRAPHIC STUDIES: I have personally reviewed the radiological images as listed and agreed with the findings in the report. CT Chest W Contrast  Result Date: 03/01/2019 CLINICAL DATA:  Restaging endometrial cancer. Initial diagnosis January 2018. EXAM: CT CHEST, ABDOMEN, AND PELVIS WITH CONTRAST TECHNIQUE: Multidetector CT imaging of the chest, abdomen and pelvis was performed following the standard protocol during bolus administration of intravenous contrast. CONTRAST:  131m OMNIPAQUE IOHEXOL 300 MG/ML  SOLN COMPARISON:  PET-CT 11/02/2018 FINDINGS: CT CHEST FINDINGS Cardiovascular: The heart is normal in size. No pericardial effusion. The aorta is normal in caliber. No dissection. No atherosclerotic calcifications. The branch vessels are patent. No definite coronary artery calcifications. The  pulmonary arteries are grossly normal. Mediastinum/Nodes: No mediastinal or hilar mass or adenopathy. Small scattered lymph nodes are stable. Stable moderate-sized hiatal hernia. Lungs/Pleura: The lungs are clear of an acute process. No worrisome pulmonary lesions. No pulmonary nodules to suggest metastatic disease. Musculoskeletal: No breast masses, supraclavicular or axillary adenopathy. No breast masses. The thyroid gland appears normal. No significant bony findings. CT ABDOMEN PELVIS FINDINGS Hepatobiliary: Small left hepatic lobe cysts are stable. No worrisome hepatic lesions or intrahepatic biliary dilatation. The gallbladder is surgically absent. No common bile duct dilatation. Pancreas: No mass, inflammation or ductal dilatation. Spleen: Normal size. No focal lesions. Adrenals/Urinary Tract: The adrenal glands and kidneys are unremarkable. The bladder is unremarkable. Stomach/Bowel: The stomach, duodenum, small bowel and colon are unremarkable. No acute inflammatory changes, mass lesions or obstructive findings. The terminal ileum is normal. The appendix is normal. Colonic diverticulosis and evidence of  prior sigmoid colon surgery but no acute abnormality. Vascular/Lymphatic: The aorta is normal in caliber. No dissection. The branch vessels are patent. The major venous structures are patent. No mesenteric or retroperitoneal mass or adenopathy. Small scattered lymph nodes are noted. Reproductive: Surgically absent. No pelvic mass or pelvic adenopathy. No omental or peritoneal surface disease. Other: No pelvic mass or adenopathy. No free pelvic fluid collections. No inguinal mass or adenopathy. No abdominal wall hernia or subcutaneous lesions. Musculoskeletal: No significant bony findings. IMPRESSION: 1. No CT findings for metastatic disease involving the chest, abdomen or pelvis. 2. Stable moderate-sized hiatal hernia. 3. Status post cholecystectomy. No biliary dilatation. 4. Stable hepatic cysts. Electronically Signed   By: Marijo Sanes M.D.   On: 03/01/2019 14:47   CT Abdomen Pelvis W Contrast  Result Date: 03/01/2019 CLINICAL DATA:  Restaging endometrial cancer. Initial diagnosis January 2018. EXAM: CT CHEST, ABDOMEN, AND PELVIS WITH CONTRAST TECHNIQUE: Multidetector CT imaging of the chest, abdomen and pelvis was performed following the standard protocol during bolus administration of intravenous contrast. CONTRAST:  140m OMNIPAQUE IOHEXOL 300 MG/ML  SOLN COMPARISON:  PET-CT 11/02/2018 FINDINGS: CT CHEST FINDINGS Cardiovascular: The heart is normal in size. No pericardial effusion. The aorta is normal in caliber. No dissection. No atherosclerotic calcifications. The branch vessels are patent. No definite coronary artery calcifications. The pulmonary arteries are grossly normal. Mediastinum/Nodes: No mediastinal or hilar mass or adenopathy. Small scattered lymph nodes are stable. Stable moderate-sized hiatal hernia. Lungs/Pleura: The lungs are clear of an acute process. No worrisome pulmonary lesions. No pulmonary nodules to suggest metastatic disease. Musculoskeletal: No breast masses, supraclavicular or  axillary adenopathy. No breast masses. The thyroid gland appears normal. No significant bony findings. CT ABDOMEN PELVIS FINDINGS Hepatobiliary: Small left hepatic lobe cysts are stable. No worrisome hepatic lesions or intrahepatic biliary dilatation. The gallbladder is surgically absent. No common bile duct dilatation. Pancreas: No mass, inflammation or ductal dilatation. Spleen: Normal size. No focal lesions. Adrenals/Urinary Tract: The adrenal glands and kidneys are unremarkable. The bladder is unremarkable. Stomach/Bowel: The stomach, duodenum, small bowel and colon are unremarkable. No acute inflammatory changes, mass lesions or obstructive findings. The terminal ileum is normal. The appendix is normal. Colonic diverticulosis and evidence of prior sigmoid colon surgery but no acute abnormality. Vascular/Lymphatic: The aorta is normal in caliber. No dissection. The branch vessels are patent. The major venous structures are patent. No mesenteric or retroperitoneal mass or adenopathy. Small scattered lymph nodes are noted. Reproductive: Surgically absent. No pelvic mass or pelvic adenopathy. No omental or peritoneal surface disease. Other: No pelvic mass or adenopathy. No free pelvic fluid  collections. No inguinal mass or adenopathy. No abdominal wall hernia or subcutaneous lesions. Musculoskeletal: No significant bony findings. IMPRESSION: 1. No CT findings for metastatic disease involving the chest, abdomen or pelvis. 2. Stable moderate-sized hiatal hernia. 3. Status post cholecystectomy. No biliary dilatation. 4. Stable hepatic cysts. Electronically Signed   By: Marijo Sanes M.D.   On: 03/01/2019 14:47

## 2019-03-16 NOTE — Assessment & Plan Note (Signed)
We reviewed the plan of care again She is willing to proceed We discussed briefly some of the expected side effects of treatment I recommend minimum 4 to 6 months of treatment I will see her again in 3 weeks for further follow-up

## 2019-03-16 NOTE — Telephone Encounter (Signed)
Called to do follow up after treatment yesterday. She is eating and drinking with no problems.  Denies fever. This morning she started having urinary frequency/urgency and burning with urination. She states that she has a history of UTI's.

## 2019-03-18 ENCOUNTER — Telehealth: Payer: Self-pay

## 2019-03-18 NOTE — Telephone Encounter (Signed)
She called to let Dr. Alvy Bimler know that she has a UTI and is taking Cipro for 5 days.

## 2019-03-19 NOTE — Telephone Encounter (Signed)
ok 

## 2019-03-30 NOTE — Progress Notes (Signed)
Pharmacist Chemotherapy Monitoring - Follow Up Assessment    I verify that I have reviewed each item in the below checklist:  . Regimen for the patient is scheduled for the appropriate day and plan matches scheduled date. Marland Kitchen Appropriate non-routine labs are ordered dependent on drug ordered. . If applicable, additional medications reviewed and ordered per protocol based on lifetime cumulative doses and/or treatment regimen.   Plan for follow-up and/or issues identified: No . I-vent associated with next due treatment: No . MD and/or nursing notified: No  Philomena Course 03/30/2019 8:36 AM

## 2019-04-05 ENCOUNTER — Other Ambulatory Visit: Payer: Self-pay | Admitting: Lab

## 2019-04-05 ENCOUNTER — Other Ambulatory Visit: Payer: Self-pay

## 2019-04-05 ENCOUNTER — Encounter: Payer: Self-pay | Admitting: Hematology and Oncology

## 2019-04-05 ENCOUNTER — Inpatient Hospital Stay: Payer: Medicare Other

## 2019-04-05 ENCOUNTER — Inpatient Hospital Stay (HOSPITAL_BASED_OUTPATIENT_CLINIC_OR_DEPARTMENT_OTHER): Payer: Medicare Other | Admitting: Hematology and Oncology

## 2019-04-05 DIAGNOSIS — Z7189 Other specified counseling: Secondary | ICD-10-CM

## 2019-04-05 DIAGNOSIS — C541 Malignant neoplasm of endometrium: Secondary | ICD-10-CM

## 2019-04-05 DIAGNOSIS — R748 Abnormal levels of other serum enzymes: Secondary | ICD-10-CM

## 2019-04-05 DIAGNOSIS — M5442 Lumbago with sciatica, left side: Secondary | ICD-10-CM

## 2019-04-05 DIAGNOSIS — G8929 Other chronic pain: Secondary | ICD-10-CM

## 2019-04-05 DIAGNOSIS — Z5112 Encounter for antineoplastic immunotherapy: Secondary | ICD-10-CM | POA: Diagnosis not present

## 2019-04-05 HISTORY — DX: Abnormal levels of other serum enzymes: R74.8

## 2019-04-05 LAB — CMP (CANCER CENTER ONLY)
ALT: 19 U/L (ref 0–44)
AST: 18 U/L (ref 15–41)
Albumin: 3.5 g/dL (ref 3.5–5.0)
Alkaline Phosphatase: 128 U/L — ABNORMAL HIGH (ref 38–126)
Anion gap: 9 (ref 5–15)
BUN: 15 mg/dL (ref 6–20)
CO2: 22 mmol/L (ref 22–32)
Calcium: 8.8 mg/dL — ABNORMAL LOW (ref 8.9–10.3)
Chloride: 109 mmol/L (ref 98–111)
Creatinine: 0.85 mg/dL (ref 0.44–1.00)
GFR, Est AFR Am: >60 mL/min (ref >60)
GFR, Estimated: >60 mL/min (ref >60)
Glucose, Bld: 105 mg/dL — ABNORMAL HIGH (ref 70–99)
Potassium: 4.3 mmol/L (ref 3.5–5.1)
Sodium: 140 mmol/L (ref 135–145)
Total Bilirubin: 0.5 mg/dL (ref 0.3–1.2)
Total Protein: 6.9 g/dL (ref 6.5–8.1)

## 2019-04-05 LAB — TSH: TSH: 1.284 u[IU]/mL (ref 0.308–3.960)

## 2019-04-05 LAB — CBC WITH DIFFERENTIAL (CANCER CENTER ONLY)
Abs Immature Granulocytes: 0.04 10*3/uL (ref 0.00–0.07)
Basophils Absolute: 0.1 10*3/uL (ref 0.0–0.1)
Basophils Relative: 1 %
Eosinophils Absolute: 0.3 10*3/uL (ref 0.0–0.5)
Eosinophils Relative: 3 %
HCT: 40.6 % (ref 36.0–46.0)
Hemoglobin: 13.4 g/dL (ref 12.0–15.0)
Immature Granulocytes: 1 %
Lymphocytes Relative: 19 %
Lymphs Abs: 1.6 10*3/uL (ref 0.7–4.0)
MCH: 30 pg (ref 26.0–34.0)
MCHC: 33 g/dL (ref 30.0–36.0)
MCV: 91 fL (ref 80.0–100.0)
Monocytes Absolute: 0.6 10*3/uL (ref 0.1–1.0)
Monocytes Relative: 7 %
Neutro Abs: 5.9 10*3/uL (ref 1.7–7.7)
Neutrophils Relative %: 69 %
Platelet Count: 253 10*3/uL (ref 150–400)
RBC: 4.46 MIL/uL (ref 3.87–5.11)
RDW: 13.9 % (ref 11.5–15.5)
WBC Count: 8.4 10*3/uL (ref 4.0–10.5)
nRBC: 0 % (ref 0.0–0.2)

## 2019-04-05 MED ORDER — SODIUM CHLORIDE 0.9 % IV SOLN
Freq: Once | INTRAVENOUS | Status: AC
  Filled 2019-04-05: qty 250

## 2019-04-05 MED ORDER — SODIUM CHLORIDE 0.9 % IV SOLN
200.0000 mg | Freq: Once | INTRAVENOUS | Status: AC
  Administered 2019-04-05: 200 mg via INTRAVENOUS
  Filled 2019-04-05: qty 8

## 2019-04-05 NOTE — Assessment & Plan Note (Signed)
She has recent back pain flare She is prescribed pain medicine and Lyrica by her primary care doctor I recommend also conservative approach with Tylenol and heat pad as needed

## 2019-04-05 NOTE — Assessment & Plan Note (Signed)
She has mildly elevated liver enzymes, could be related to class II obesity Observe only for now

## 2019-04-05 NOTE — Assessment & Plan Note (Signed)
So far, she tolerated treatment well without major side effects We will proceed with treatment as scheduled

## 2019-04-05 NOTE — Progress Notes (Signed)
[facility] OFFICE PROGRESS NOTE  Patient Care Team: Imagene Riches, NP as PCP - General Berniece Salines, DO as PCP - Cardiology (Cardiology) Gavin Pound, MD as Consulting Physician (Rheumatology) Awanda Mink Craige Cotta, RN as Oncology Nurse Navigator (Oncology)  ASSESSMENT & PLAN:  Endometrial cancer [facility]) So far, she tolerated treatment well without major side effects We will proceed with treatment as scheduled  Back pain She has recent back pain flare She is prescribed pain medicine and Lyrica by her primary care doctor I recommend also conservative approach with Tylenol and heat pad as needed  Elevated liver enzymes She has mildly elevated liver enzymes, could be related to class II obesity Observe only for now   No orders of the defined types were placed in this encounter.   All questions were answered. The patient knows to call the clinic with any problems, questions or concerns. The total time spent in the appointment was 20 minutes encounter with patients including review of chart and various tests results, discussions about plan of care and coordination of care plan   Heath Lark, MD 04/05/2019 12:13 PM  INTERVAL HISTORY: Please see below for problem oriented charting. She returns with her friend, Kalman Shan, for cycle 2 of treatment She tolerated recent treatment well No infusion reaction No recent nausea She had recent flare of her back pain Otherwise, she tolerated treatment fairly well.  SUMMARY OF ONCOLOGIC HISTORY: Oncology History Overview Note  MSI high disease detected   Endometrial cancer ([place])  07/05/2015 Pathology Results   She had abnormal PAP   11/24/2015 Procedure   She underwent endometrial sampling that came back abnormal   01/10/2016 Procedure   She had EGD which showed esophagitis   01/18/2016 Pathology Results   Diagnosis 1. Lymph node, sentinel, biopsy, right obturator - MICROMETASTASIS IN ONE OF ONE LYMPH NODES (1/1). 2. Lymph node,  sentinel, biopsy, left external - ONE OF ONE LYMPH NODES NEGATIVE FOR CARCINOMA (0/1). 3. Lymph node, sentinel, biopsy, left obturator - MICROMETASTASIS IN ONE OF ONE LYMPH NODES (1/1). 4. Uterus +/- tubes/ovaries, neoplastic - UTERUS: -ENDOMYOMETRIUM: ENDOMETRIOID ADENOCARCINOMA, FIGO GRADE 1, SPANNING 3.3 CM. TUMOR INVADES LESS THAN 1/2 OF THE MYOMETRIUM. SEE ONCOLOGY TABLE. -SEROSA: UNREMARKABLE. NO MALIGNANCY IDENTIFIED. - CERVIX: BENIGN SQUAMOUS AND ENDOCERVICAL MUCOSA. NO DYSPLASIA OR MALIGNANCY. - BILATERAL OVARIES: INCLUSION CYSTS. NO MALIGNANCY. - BILATERAL FALLOPIAN TUBES: UNREMARKABLE RIGHT TUBE. LEFT TUBE IS NOT IDENTIFIED GROSSLY OR MICROSCOPICALLY.   01/18/2016 Surgery   She underwent robotic-assisted laparoscopic total hysterectomy with bilateral salpingoophorectomy, sentinel lymph node biopsy, lysis of adhesions   04/24/2016 Genetic Testing   Negative genetic testing on the TumorNext Lynch with CancerNext.  This is paired germline and tumor analyses for enhanced diagnosis of lynch syndrome plus analyses of 29 additional genes associated with hereditary cancer.  The CancerNext gene panel offered by [employer] includes sequencing and rearrangement analysis for the following 34 genes:   APC, ATM, BARD1, BMPR1A, BRCA1, BRCA2, BRIP1, CDH1, CDK4, CDKN2A, CHEK2, DICER, HOXB13, EPCAM, GREM1, MLH1, MRE11A, MSH2, MSH6, MUTYH, NBN, NF1, PALB2, PMS2, POLD1, POLE, PTEN, RAD50, RAD51C, RAD51D, SMAD4, SMARCA4, STK11, and TP53.   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  Taken with the germline results demonstrating absence of pathogenic mutations or likely pathogenic variants (VLPs) in the mismatch repair (MMR) genes, the likelihood that this individual has Lynch syndrome/HNPCC is greatly decreased; however, the possibility of an undetected variant of either germline or somatic origin due to mosaicism and  other rare etiologies cannot be  ruled out by the current methodology.  Correlation with clinical history and external tumor testing results is advised.    10/29/2018 PET scan   Outside PET scan showed enlarged aorta-caval LN   12/17/2018 - 01/15/2019 Radiation Therapy   Radiation Treatment Dates: 12/17/2018 through 01/15/2019 Site Technique Total Dose (Gy) Dose per Fx (Gy) Completed Fx Beam Energies  Abdomen: Abd_PA node 3D 50/50 2.5 20/20 6X, 15X        03/01/2019 Imaging   1. No CT findings for metastatic disease involving the chest, abdomen or pelvis. 2. Stable moderate-sized hiatal hernia. 3. Status post cholecystectomy. No biliary dilatation. 4. Stable hepatic cysts.   03/15/2019 -  Chemotherapy   The patient had pembrolizumab (KEYTRUDA) 200 mg in sodium chloride 0.9 % 50 mL chemo infusion, 200 mg, Intravenous, Once, 2 of 6 cycles Administration: 200 mg (03/15/2019), 200 mg (04/05/2019)  for chemotherapy treatment.      REVIEW OF SYSTEMS:   Constitutional: Denies fevers, chills or abnormal weight loss Eyes: Denies blurriness of vision Ears, nose, mouth, throat, and face: Denies mucositis or sore throat Respiratory: Denies cough, dyspnea or wheezes Cardiovascular: Denies palpitation, chest discomfort or lower extremity swelling Gastrointestinal:  Denies nausea, heartburn or change in bowel habits Skin: Denies abnormal skin rashes Lymphatics: Denies new lymphadenopathy or easy bruising Neurological:Denies numbness, tingling or new weaknesses Behavioral/Psych: Mood is stable, no new changes  All other systems were reviewed with the patient and are negative.  I have reviewed the past medical history, past surgical history, social history and family history with the patient and they are unchanged from previous note.  ALLERGIES:  is allergic to codeine and sulfa antibiotics.  MEDICATIONS:  Current Outpatient Medications  Medication Sig Dispense Refill  . B Complex Vitamins (VITAMIN-B COMPLEX) TABS Take 1 tablet  by mouth daily.     Marland Kitchen dicyclomine (BENTYL) 10 MG capsule Take 10 mg by mouth 2 (two) times daily as needed for spasms.     Marland Kitchen escitalopram (LEXAPRO) 20 MG tablet TAKE ONE AND ONE HALF (1.5) TABLETS BY MOUTH EVERY MORNING  1  . famotidine (PEPCID) 40 MG tablet TAKE 1 (ONE) TABLET TWICE DAILY, WITH BREAKFAST AND AT BEDTIME    . fluticasone (FLONASE) 50 MCG/ACT nasal spray INSTILL 2 SPRAYS INTO EACH NOSTRIL ONCE A DAY  0  . ketoconazole (NIZORAL) 2 % shampoo ketoconazole 2 % shampoo  APPLY TO WASH TWICE WEEKLY    . losartan (COZAAR) 25 MG tablet Take 25 mg by mouth daily.  0  . nystatin cream (MYCOSTATIN)     . ondansetron (ZOFRAN) 8 MG tablet Take 1 tablet (8 mg total) by mouth 2 (two) times daily as needed (Nausea or vomiting). 30 tablet 1  . pantoprazole (PROTONIX) 40 MG tablet Take 40 mg by mouth 2 (two) times daily.   11  . pravastatin (PRAVACHOL) 20 MG tablet TAKE 1 TABLET BY MOUTH DAILY    . pregabalin (LYRICA) 150 MG capsule Take 150 mg by mouth 2 (two) times daily.    . prochlorperazine (COMPAZINE) 10 MG tablet Take 1 tablet (10 mg total) by mouth every 6 (six) hours as needed for nausea or vomiting. 30 tablet 0  . prochlorperazine (COMPAZINE) 10 MG tablet Take 1 tablet (10 mg total) by mouth every 6 (six) hours as needed (Nausea or vomiting). 30 tablet 1  . rosuvastatin (CRESTOR) 40 MG tablet Take 40 mg by mouth daily.    . solifenacin (VESICARE) 10 MG  tablet Vesicare 10 mg tablet  TAKE 1 TABLET BY MOUTH EVERY DAY     No current facility-administered medications for this visit.    PHYSICAL EXAMINATION: ECOG PERFORMANCE STATUS: 1 - Symptomatic but completely ambulatory  Vitals:   04/05/19 1047  BP: 128/64  Pulse: 71  Resp: 18  Temp: 98.3 F (36.8 C)  SpO2: 100%   Filed Weights   04/05/19 1047  Weight: 209 lb 6.4 oz (95 kg)    GENERAL:alert, no distress and comfortable SKIN: skin color, texture, turgor are normal, no rashes or significant lesions EYES: normal, Conjunctiva  are pink and non-injected, sclera clear OROPHARYNX:no exudate, no erythema and lips, buccal mucosa, and tongue normal  NECK: supple, thyroid normal size, non-tender, without nodularity LYMPH:  no palpable lymphadenopathy in the cervical, axillary or inguinal LUNGS: clear to auscultation and percussion with normal breathing effort HEART: regular rate & rhythm and no murmurs and no lower extremity edema ABDOMEN:abdomen soft, non-tender and normal bowel sounds Musculoskeletal:no cyanosis of digits and no clubbing  NEURO: alert & oriented x 3 with fluent speech, no focal motor/sensory deficits  LABORATORY DATA:  I have reviewed the data as listed    Component Value Date/Time   NA 140 04/05/2019 1010   NA 142 10/06/2018 1101   K 4.3 04/05/2019 1010   CL 109 04/05/2019 1010   CO2 22 04/05/2019 1010   GLUCOSE 105 (H) 04/05/2019 1010   BUN 15 04/05/2019 1010   BUN 14 10/06/2018 1101   CREATININE 0.85 04/05/2019 1010   CREATININE 0.87 12/06/2015 1022   CALCIUM 8.8 (L) 04/05/2019 1010   PROT 6.9 04/05/2019 1010   ALBUMIN 3.5 04/05/2019 1010   AST 18 04/05/2019 1010   ALT 19 04/05/2019 1010   ALKPHOS 128 (H) 04/05/2019 1010   BILITOT 0.5 04/05/2019 1010   GFRNONAA >60 04/05/2019 1010   GFRAA >60 04/05/2019 1010    No results found for: SPEP, UPEP  Lab Results  Component Value Date   WBC 8.4 04/05/2019   NEUTROABS 5.9 04/05/2019   HGB 13.4 04/05/2019   HCT 40.6 04/05/2019   MCV 91.0 04/05/2019   PLT 253 04/05/2019      Chemistry      Component Value Date/Time   NA 140 04/05/2019 1010   NA 142 10/06/2018 1101   K 4.3 04/05/2019 1010   CL 109 04/05/2019 1010   CO2 22 04/05/2019 1010   BUN 15 04/05/2019 1010   BUN 14 10/06/2018 1101   CREATININE 0.85 04/05/2019 1010   CREATININE 0.87 12/06/2015 1022      Component Value Date/Time   CALCIUM 8.8 (L) 04/05/2019 1010   ALKPHOS 128 (H) 04/05/2019 1010   AST 18 04/05/2019 1010   ALT 19 04/05/2019 1010   BILITOT 0.5  04/05/2019 1010

## 2019-04-05 NOTE — Patient Instructions (Signed)
COVID-19 Vaccine Information can be found at: [email or web address] For questions related to vaccine distribution or appointments, please email [email or web address] or call [phone].   [facility] Discharge Instructions for Patients Receiving Immunotherapy  Today you received the following immunotherapy agents: Pembrolizumab Beryle Flock)  To help prevent nausea and vomiting after your treatment, we encourage you to take your nausea medication as directed by your provider.   If you develop nausea and vomiting that is not controlled by your nausea medication, call the clinic.   BELOW ARE SYMPTOMS THAT SHOULD BE REPORTED IMMEDIATELY:  *FEVER GREATER THAN 100.5 F  *CHILLS WITH OR WITHOUT FEVER  NAUSEA AND VOMITING THAT IS NOT CONTROLLED WITH YOUR NAUSEA MEDICATION  *UNUSUAL SHORTNESS OF BREATH  *UNUSUAL BRUISING OR BLEEDING  TENDERNESS IN MOUTH AND THROAT WITH OR WITHOUT PRESENCE OF ULCERS  *URINARY PROBLEMS  *BOWEL PROBLEMS  UNUSUAL RASH Items with * indicate a potential emergency and should be followed up as soon as possible.  Feel free to call the clinic should you have any questions or concerns. The clinic phone number is [phone].  Please show the [facility] at check-in to the Emergency Department and triage nurse.  Pembrolizumab injection What is this medicine? PEMBROLIZUMAB (pem broe liz ue mab) is a monoclonal antibody. It is used to treat certain types of cancer. This medicine may be used for other purposes; ask your health care provider or pharmacist if you have questions. COMMON BRAND NAME(S): Keytruda What should I tell my health care provider before I take this medicine? They need to know if you have any of these conditions:  diabetes  immune system problems  inflammatory bowel disease  liver disease  lung or breathing disease  lupus  received or scheduled to receive  an organ transplant or a stem-cell transplant that uses donor stem cells  an unusual or allergic reaction to pembrolizumab, other medicines, foods, dyes, or preservatives  pregnant or trying to get pregnant  breast-feeding How should I use this medicine? This medicine is for infusion into a vein. It is given by a health care professional in a hospital or clinic setting. A special MedGuide will be given to you before each treatment. Be sure to read this information carefully each time. Talk to your pediatrician regarding the use of this medicine in children. While this drug may be prescribed for children as young as 6 months for selected conditions, precautions do apply. Overdosage: If you think you have taken too much of this medicine contact a poison control center or emergency room at once. NOTE: This medicine is only for you. Do not share this medicine with others. What if I miss a dose? It is important not to miss your dose. Call your doctor or health care professional if you are unable to keep an appointment. What may interact with this medicine? Interactions have not been studied. Give your health care provider a list of all the medicines, herbs, non-prescription drugs, or dietary supplements you use. Also tell them if you smoke, drink alcohol, or use illegal drugs. Some items may interact with your medicine. This list may not describe all possible interactions. Give your health care provider a list of all the medicines, herbs, non-prescription drugs, or dietary supplements you use. Also tell them if you smoke, drink alcohol, or use illegal drugs. Some items may interact with your medicine. What should I watch for while using this medicine? Your condition will be monitored carefully while you are receiving this medicine. You  may need blood work done while you are taking this medicine. Do not become pregnant while taking this medicine or for 4 months after stopping it. Women should inform  their doctor if they wish to become pregnant or think they might be pregnant. There is a potential for serious side effects to an unborn child. Talk to your health care professional or pharmacist for more information. Do not breast-feed an infant while taking this medicine or for 4 months after the last dose. What side effects may I notice from receiving this medicine? Side effects that you should report to your doctor or health care professional as soon as possible:  allergic reactions like skin rash, itching or hives, swelling of the face, lips, or tongue  bloody or black, tarry  breathing problems  changes in vision  chest pain  chills  confusion  constipation  cough  diarrhea  dizziness or feeling faint or lightheaded  fast or irregular heartbeat  fever  flushing  joint pain  low blood counts - this medicine may decrease the number of white blood cells, red blood cells and platelets. You may be at increased risk for infections and bleeding.  muscle pain  muscle weakness  pain, tingling, numbness in the hands or feet  persistent headache  redness, blistering, peeling or loosening of the skin, including inside the mouth  signs and symptoms of high blood sugar such as dizziness; dry mouth; dry skin; fruity breath; nausea; stomach pain; increased hunger or thirst; increased urination  signs and symptoms of kidney injury like trouble passing urine or change in the amount of urine  signs and symptoms of liver injury like dark urine, light-colored stools, loss of appetite, nausea, right upper belly pain, yellowing of the eyes or skin  sweating  swollen lymph nodes  weight loss Side effects that usually do not require medical attention (report to your doctor or health care professional if they continue or are bothersome):  decreased appetite  hair loss  muscle pain  tiredness This list may not describe all possible side effects. Call your doctor for  medical advice about side effects. You may report side effects to FDA at 1-800-FDA-1088. Where should I keep my medicine? This drug is given in a hospital or clinic and will not be stored at home. NOTE: This sheet is a summary. It may not cover all possible information. If you have questions about this medicine, talk to your doctor, pharmacist, or health care provider.  2020 Elsevier/Gold Standard (2018-10-30 18:07:58)  Coronavirus (COVID-19) Are you at risk?  Are you at risk for the Coronavirus (COVID-19)?  To be considered HIGH RISK for Coronavirus (COVID-19), you have to meet the following criteria:  . Traveled to [place], [place], [place], [place] or [place]; or in the [place] to [place], [place], [place], or [place]; and have fever, cough, and shortness of breath within the last 2 weeks of travel OR . Been in close contact with a person diagnosed with COVID-19 within the last 2 weeks and have fever, cough, and shortness of breath . IF YOU DO NOT MEET THESE CRITERIA, YOU ARE CONSIDERED LOW RISK FOR COVID-19.  What to do if you are HIGH RISK for COVID-19?  Marland Kitchen If you are having a medical emergency, call 911. . Seek medical care right away. Before you go to a doctor's office, urgent care or emergency department, call ahead and tell them about your recent travel, contact with someone diagnosed with COVID-19, and your symptoms.  You should receive instructions from your physician's office regarding next steps of care.  . When you arrive at healthcare provider, tell the healthcare staff immediately you have returned from visiting [place], [place], [place], [place] or [place]; or traveled in the [place] to [place], [place], [place], or [place]; in the last two weeks or you have been in close contact with a person diagnosed with COVID-19 in the last 2 weeks.   . Tell the health care staff about your symptoms: fever, cough and shortness of breath. . After you have been seen by  a medical provider, you will be either: o Tested for (COVID-19) and discharged home on quarantine except to seek medical care if symptoms worsen, and asked to  - Stay home and avoid contact with others until you get your results (4-5 days)  - Avoid travel on public transportation if possible (such as bus, train, or airplane) or o Sent to the Emergency Department by EMS for evaluation, COVID-19 testing, and possible admission depending on your condition and test results.  What to do if you are LOW RISK for COVID-19?  Reduce your risk of any infection by using the same precautions used for avoiding the common cold or flu:  Marland Kitchen Wash your hands often with soap and warm water for at least 20 seconds.  If soap and water are not readily available, use an alcohol-based hand sanitizer with at least 60% alcohol.  . If coughing or sneezing, cover your mouth and nose by coughing or sneezing into the elbow areas of your shirt or coat, into a tissue or into your sleeve (not your hands). . Avoid shaking hands with others and consider head nods or verbal greetings only. . Avoid touching your eyes, nose, or mouth with unwashed hands.  . Avoid close contact with people who are sick. . Avoid places or events with large numbers of people in one location, like concerts or sporting events. . Carefully consider travel plans you have or are making. . If you are planning any travel outside or inside the [place], visit the CDC's Travelers' Health webpage for the latest health notices. . If you have some symptoms but not all symptoms, continue to monitor at home and seek medical attention if your symptoms worsen. . If you are having a medical emergency, call 911.   [facility] / e-Visit: [email or web address]         [facility]: [facility]: [id number]                   [facility]  Urgent Care: [phone]

## 2019-04-19 ENCOUNTER — Telehealth: Payer: Self-pay

## 2019-04-19 NOTE — Telephone Encounter (Signed)
RN returned call, voicemail left for patient to call back.

## 2019-04-20 ENCOUNTER — Telehealth: Payer: Self-pay

## 2019-04-20 NOTE — Telephone Encounter (Signed)
I looked at them  No need to do anything now I will discuss with her when I see her

## 2019-04-20 NOTE — Progress Notes (Signed)
Pharmacist Chemotherapy Monitoring - Follow Up Assessment    I verify that I have reviewed each item in the below checklist:  . Regimen for the patient is scheduled for the appropriate day and plan matches scheduled date. Marland Kitchen Appropriate non-routine labs are ordered dependent on drug ordered. . If applicable, additional medications reviewed and ordered per protocol based on lifetime cumulative doses and/or treatment regimen.   Plan for follow-up and/or issues identified: No . I-vent associated with next due treatment: No . MD and/or nursing notified: No  Sherre Wooton D 04/20/2019 8:51 AM

## 2019-04-20 NOTE — Telephone Encounter (Signed)
Returned call from her. She saw PCP and thyroid level is really low. Left a message asking her to call the office back.

## 2019-04-20 NOTE — Telephone Encounter (Signed)
Called and given below message. She verbalized understanding. 

## 2019-04-20 NOTE — Telephone Encounter (Signed)
PCP office faxed lab results.

## 2019-04-26 ENCOUNTER — Inpatient Hospital Stay: Payer: Medicare Other

## 2019-04-26 ENCOUNTER — Encounter: Payer: Self-pay | Admitting: Hematology and Oncology

## 2019-04-26 ENCOUNTER — Other Ambulatory Visit: Payer: Self-pay

## 2019-04-26 ENCOUNTER — Inpatient Hospital Stay (HOSPITAL_BASED_OUTPATIENT_CLINIC_OR_DEPARTMENT_OTHER): Payer: Medicare Other | Admitting: Hematology and Oncology

## 2019-04-26 ENCOUNTER — Inpatient Hospital Stay: Payer: Medicare Other | Attending: Gynecologic Oncology

## 2019-04-26 VITALS — BP 134/95

## 2019-04-26 DIAGNOSIS — M5442 Lumbago with sciatica, left side: Secondary | ICD-10-CM | POA: Diagnosis not present

## 2019-04-26 DIAGNOSIS — Z5112 Encounter for antineoplastic immunotherapy: Secondary | ICD-10-CM | POA: Diagnosis not present

## 2019-04-26 DIAGNOSIS — Z79899 Other long term (current) drug therapy: Secondary | ICD-10-CM | POA: Insufficient documentation

## 2019-04-26 DIAGNOSIS — G8929 Other chronic pain: Secondary | ICD-10-CM | POA: Diagnosis not present

## 2019-04-26 DIAGNOSIS — Z7189 Other specified counseling: Secondary | ICD-10-CM

## 2019-04-26 DIAGNOSIS — E039 Hypothyroidism, unspecified: Secondary | ICD-10-CM

## 2019-04-26 DIAGNOSIS — C541 Malignant neoplasm of endometrium: Secondary | ICD-10-CM | POA: Diagnosis present

## 2019-04-26 HISTORY — DX: Hypothyroidism, unspecified: E03.9

## 2019-04-26 LAB — CBC WITH DIFFERENTIAL (CANCER CENTER ONLY)
Abs Immature Granulocytes: 0.03 10*3/uL (ref 0.00–0.07)
Basophils Absolute: 0 10*3/uL (ref 0.0–0.1)
Basophils Relative: 1 %
Eosinophils Absolute: 0.3 10*3/uL (ref 0.0–0.5)
Eosinophils Relative: 4 %
HCT: 40.1 % (ref 36.0–46.0)
Hemoglobin: 13.3 g/dL (ref 12.0–15.0)
Immature Granulocytes: 0 %
Lymphocytes Relative: 18 %
Lymphs Abs: 1.3 10*3/uL (ref 0.7–4.0)
MCH: 30.2 pg (ref 26.0–34.0)
MCHC: 33.2 g/dL (ref 30.0–36.0)
MCV: 91.1 fL (ref 80.0–100.0)
Monocytes Absolute: 0.6 10*3/uL (ref 0.1–1.0)
Monocytes Relative: 9 %
Neutro Abs: 5.1 10*3/uL (ref 1.7–7.7)
Neutrophils Relative %: 68 %
Platelet Count: 237 10*3/uL (ref 150–400)
RBC: 4.4 MIL/uL (ref 3.87–5.11)
RDW: 13 % (ref 11.5–15.5)
WBC Count: 7.4 10*3/uL (ref 4.0–10.5)
nRBC: 0 % (ref 0.0–0.2)

## 2019-04-26 LAB — CMP (CANCER CENTER ONLY)
ALT: 20 U/L (ref 0–44)
AST: 14 U/L — ABNORMAL LOW (ref 15–41)
Albumin: 3.3 g/dL — ABNORMAL LOW (ref 3.5–5.0)
Alkaline Phosphatase: 120 U/L (ref 38–126)
Anion gap: 6 (ref 5–15)
BUN: 14 mg/dL (ref 6–20)
CO2: 21 mmol/L — ABNORMAL LOW (ref 22–32)
Calcium: 8.7 mg/dL — ABNORMAL LOW (ref 8.9–10.3)
Chloride: 111 mmol/L (ref 98–111)
Creatinine: 0.81 mg/dL (ref 0.44–1.00)
GFR, Est AFR Am: >60 mL/min (ref >60)
GFR, Estimated: >60 mL/min (ref >60)
Glucose, Bld: 112 mg/dL — ABNORMAL HIGH (ref 70–99)
Potassium: 4.1 mmol/L (ref 3.5–5.1)
Sodium: 138 mmol/L (ref 135–145)
Total Bilirubin: 0.4 mg/dL (ref 0.3–1.2)
Total Protein: 6.6 g/dL (ref 6.5–8.1)

## 2019-04-26 LAB — TSH: TSH: <0.08 u[IU]/mL — ABNORMAL LOW (ref 0.308–3.960)

## 2019-04-26 MED ORDER — SODIUM CHLORIDE 0.9 % IV SOLN
Freq: Once | INTRAVENOUS | Status: AC
  Filled 2019-04-26: qty 250

## 2019-04-26 MED ORDER — SODIUM CHLORIDE 0.9 % IV SOLN
200.0000 mg | Freq: Once | INTRAVENOUS | Status: AC
  Administered 2019-04-26: 11:00:00 200 mg via INTRAVENOUS
  Filled 2019-04-26: qty 8

## 2019-04-26 NOTE — Assessment & Plan Note (Signed)
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

## 2019-04-26 NOTE — Patient Instructions (Signed)
[facility] Discharge Instructions for Patients Receiving Chemotherapy  Today you received the following chemotherapy agents: pembrolizumab.  To help prevent nausea and vomiting after your treatment, we encourage you to take your nausea medication as directed.   If you develop nausea and vomiting that is not controlled by your nausea medication, call the clinic.   BELOW ARE SYMPTOMS THAT SHOULD BE REPORTED IMMEDIATELY:  *FEVER GREATER THAN 100.5 F  *CHILLS WITH OR WITHOUT FEVER  NAUSEA AND VOMITING THAT IS NOT CONTROLLED WITH YOUR NAUSEA MEDICATION  *UNUSUAL SHORTNESS OF BREATH  *UNUSUAL BRUISING OR BLEEDING  TENDERNESS IN MOUTH AND THROAT WITH OR WITHOUT PRESENCE OF ULCERS  *URINARY PROBLEMS  *BOWEL PROBLEMS  UNUSUAL RASH Items with * indicate a potential emergency and should be followed up as soon as possible.  Feel free to call the clinic should you have any questions or concerns. The clinic phone number is [phone].  Please show the CHEMO ALERT CARD at check-in to the Emergency Department and triage nurse.   

## 2019-04-26 NOTE — Assessment & Plan Note (Signed)
So far, she tolerated treatment fairly well Her back pain and occasional abdominal discomfort are unrelated to side effects of treatment She has changes in her TSH level which is not uncommon while on Keytruda For now, she does not need thyroid replacement therapy I will see her again in 3 weeks for cycle 4 and after cycle fall, we will repeat CT imaging for further evaluation

## 2019-04-26 NOTE — Progress Notes (Signed)
[place] OFFICE PROGRESS NOTE  Patient Care Team: Imagene Riches, NP as PCP - General Berniece Salines, DO as PCP - Cardiology (Cardiology) Gavin Pound, MD as Consulting Physician (Rheumatology) Awanda Mink Craige Cotta, RN as Oncology Nurse Navigator (Oncology)  ASSESSMENT & PLAN:  Endometrial cancer [facility]) So far, she tolerated treatment fairly well Her back pain and occasional abdominal discomfort are unrelated to side effects of treatment She has changes in her TSH level which is not uncommon while on Keytruda For now, she does not need thyroid replacement therapy I will see her again in 3 weeks for cycle 4 and after cycle fall, we will repeat CT imaging for further evaluation  Back pain She has recent back pain flare She is prescribed pain medicine and Lyrica by her primary care doctor I recommend also conservative approach with Tylenol and heat pad as needed I also recommend her to consult her orthopedic surgeon to see if an injection is indicated Her MRI from December showed significant degenerative disc disease  Acquired hypothyroidism She has significant lab abnormalities in regards to her thyroid function I told her this is a common side effects of autoimmune dysfunction secondary to checkpoint inhibitors We will monitor carefully For now, she does not need thyroid replacement therapy   No orders of the defined types were placed in this encounter.   All questions were answered. The patient knows to call the clinic with any problems, questions or concerns. The total time spent in the appointment was 20 minutes encounter with patients including review of chart and various tests results, discussions about plan of care and coordination of care plan   Heath Lark, MD 04/26/2019 12:25 PM  INTERVAL HISTORY: Please see below for problem oriented charting. She returns for cycle 3 of Keytruda She complains of occasional abdominal discomfort and back pain She has concerns  and questions about recent labs that was drawn with her primary care doctor Otherwise, she feels fine No recent nausea or changes in bowel habits  SUMMARY OF ONCOLOGIC HISTORY: Oncology History Overview Note  MSI high disease detected   Endometrial cancer ([place])  07/05/2015 Pathology Results   She had abnormal PAP   11/24/2015 Procedure   She underwent endometrial sampling that came back abnormal   01/10/2016 Procedure   She had EGD which showed esophagitis   01/18/2016 Pathology Results   Diagnosis 1. Lymph node, sentinel, biopsy, right obturator - MICROMETASTASIS IN ONE OF ONE LYMPH NODES (1/1). 2. Lymph node, sentinel, biopsy, left external - ONE OF ONE LYMPH NODES NEGATIVE FOR CARCINOMA (0/1). 3. Lymph node, sentinel, biopsy, left obturator - MICROMETASTASIS IN ONE OF ONE LYMPH NODES (1/1). 4. Uterus +/- tubes/ovaries, neoplastic - UTERUS: -ENDOMYOMETRIUM: ENDOMETRIOID ADENOCARCINOMA, FIGO GRADE 1, SPANNING 3.3 CM. TUMOR INVADES LESS THAN 1/2 OF THE MYOMETRIUM. SEE ONCOLOGY TABLE. -SEROSA: UNREMARKABLE. NO MALIGNANCY IDENTIFIED. - CERVIX: BENIGN SQUAMOUS AND ENDOCERVICAL MUCOSA. NO DYSPLASIA OR MALIGNANCY. - BILATERAL OVARIES: INCLUSION CYSTS. NO MALIGNANCY. - BILATERAL FALLOPIAN TUBES: UNREMARKABLE RIGHT TUBE. LEFT TUBE IS NOT IDENTIFIED GROSSLY OR MICROSCOPICALLY.   01/18/2016 Surgery   She underwent robotic-assisted laparoscopic total hysterectomy with bilateral salpingoophorectomy, sentinel lymph node biopsy, lysis of adhesions   04/24/2016 Genetic Testing   Negative genetic testing on the TumorNext Lynch with CancerNext.  This is paired germline and tumor analyses for enhanced diagnosis of lynch syndrome plus analyses of 29 additional genes associated with hereditary cancer.  The CancerNext gene panel offered by [facility] includes sequencing and rearrangement analysis for the  following 34 genes:   APC, ATM, BARD1, BMPR1A, BRCA1, BRCA2, BRIP1, CDH1, CDK4, CDKN2A, CHEK2,  DICER, HOXB13, EPCAM, GREM1, MLH1, MRE11A, MSH2, MSH6, MUTYH, NBN, NF1, PALB2, PMS2, POLD1, POLE, PTEN, RAD50, RAD51C, RAD51D, SMAD4, SMARCA4, STK11, and TP53.   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  Taken with the germline results demonstrating absence of pathogenic mutations or likely pathogenic variants (VLPs) in the mismatch repair (MMR) genes, the likelihood that this individual has Lynch syndrome/HNPCC is greatly decreased; however, the possibility of an undetected variant of either germline or somatic origin due to mosaicism and other rare etiologies cannot be ruled out by the current methodology.  Correlation with clinical history and external tumor testing results is advised.    10/29/2018 PET scan   Outside PET scan showed enlarged aorta-caval LN   12/17/2018 - 01/15/2019 Radiation Therapy   Radiation Treatment Dates: 12/17/2018 through 01/15/2019 Site Technique Total Dose (Gy) Dose per Fx (Gy) Completed Fx Beam Energies  Abdomen: Abd_PA node 3D 50/50 2.5 20/20 6X, 15X        03/01/2019 Imaging   1. No CT findings for metastatic disease involving the chest, abdomen or pelvis. 2. Stable moderate-sized hiatal hernia. 3. Status post cholecystectomy. No biliary dilatation. 4. Stable hepatic cysts.   03/15/2019 -  Chemotherapy   The patient had pembrolizumab (KEYTRUDA) 200 mg in sodium chloride 0.9 % 50 mL chemo infusion, 200 mg, Intravenous, Once, 3 of 6 cycles Administration: 200 mg (03/15/2019), 200 mg (04/26/2019), 200 mg (04/05/2019)  for chemotherapy treatment.      REVIEW OF SYSTEMS:   Constitutional: Denies fevers, chills or abnormal weight loss Eyes: Denies blurriness of vision Ears, nose, mouth, throat, and face: Denies mucositis or sore throat Respiratory: Denies cough, dyspnea or wheezes Cardiovascular: Denies palpitation, chest discomfort or lower extremity swelling Gastrointestinal:  Denies nausea, heartburn or change  in bowel habits Skin: Denies abnormal skin rashes Lymphatics: Denies new lymphadenopathy or easy bruising Neurological:Denies numbness, tingling or new weaknesses Behavioral/Psych: Mood is stable, no new changes  All other systems were reviewed with the patient and are negative.  I have reviewed the past medical history, past surgical history, social history and family history with the patient and they are unchanged from previous note.  ALLERGIES:  is allergic to codeine and sulfa antibiotics.  MEDICATIONS:  Current Outpatient Medications  Medication Sig Dispense Refill  . B Complex Vitamins (VITAMIN-B COMPLEX) TABS Take 1 tablet by mouth daily.     Marland Kitchen dicyclomine (BENTYL) 10 MG capsule Take 10 mg by mouth 2 (two) times daily as needed for spasms.     Marland Kitchen escitalopram (LEXAPRO) 20 MG tablet TAKE ONE AND ONE HALF (1.5) TABLETS BY MOUTH EVERY MORNING  1  . famotidine (PEPCID) 40 MG tablet TAKE 1 (ONE) TABLET TWICE DAILY, WITH BREAKFAST AND AT BEDTIME    . fluticasone (FLONASE) 50 MCG/ACT nasal spray INSTILL 2 SPRAYS INTO EACH NOSTRIL ONCE A DAY  0  . ketoconazole (NIZORAL) 2 % shampoo ketoconazole 2 % shampoo  APPLY TO WASH TWICE WEEKLY    . losartan (COZAAR) 25 MG tablet Take 25 mg by mouth daily.  0  . nystatin cream (MYCOSTATIN)     . ondansetron (ZOFRAN) 8 MG tablet Take 1 tablet (8 mg total) by mouth 2 (two) times daily as needed (Nausea or vomiting). 30 tablet 1  . pantoprazole (PROTONIX) 40 MG tablet Take 40 mg by mouth 2 (two) times daily.   11  .  pravastatin (PRAVACHOL) 20 MG tablet TAKE 1 TABLET BY MOUTH DAILY    . pregabalin (LYRICA) 150 MG capsule Take 150 mg by mouth 2 (two) times daily.    . prochlorperazine (COMPAZINE) 10 MG tablet Take 1 tablet (10 mg total) by mouth every 6 (six) hours as needed for nausea or vomiting. 30 tablet 0  . prochlorperazine (COMPAZINE) 10 MG tablet Take 1 tablet (10 mg total) by mouth every 6 (six) hours as needed (Nausea or vomiting). 30 tablet 1   . rosuvastatin (CRESTOR) 40 MG tablet Take 40 mg by mouth daily.    . solifenacin (VESICARE) 10 MG tablet Vesicare 10 mg tablet  TAKE 1 TABLET BY MOUTH EVERY DAY     No current facility-administered medications for this visit.    PHYSICAL EXAMINATION: ECOG PERFORMANCE STATUS: 1 - Symptomatic but completely ambulatory  Vitals:   04/26/19 0934  BP: (!) 148/99  Pulse: 82  Resp: 18  Temp: 98 F (36.7 C)  SpO2: 98%   Filed Weights   04/26/19 0934  Weight: 210 lb 9.6 oz (95.5 kg)    GENERAL:alert, no distress and comfortable SKIN: skin color, texture, turgor are normal, no rashes or significant lesions EYES: normal, Conjunctiva are pink and non-injected, sclera clear OROPHARYNX:no exudate, no erythema and lips, buccal mucosa, and tongue normal  NECK: supple, thyroid normal size, non-tender, without nodularity LYMPH:  no palpable lymphadenopathy in the cervical, axillary or inguinal LUNGS: clear to auscultation and percussion with normal breathing effort HEART: regular rate & rhythm and no murmurs and no lower extremity edema ABDOMEN:abdomen soft, non-tender and normal bowel sounds Musculoskeletal:no cyanosis of digits and no clubbing  NEURO: alert & oriented x 3 with fluent speech, no focal motor/sensory deficits  LABORATORY DATA:  I have reviewed the data as listed    Component Value Date/Time   NA 138 04/26/2019 0912   NA 142 10/06/2018 1101   K 4.1 04/26/2019 0912   CL 111 04/26/2019 0912   CO2 21 (L) 04/26/2019 0912   GLUCOSE 112 (H) 04/26/2019 0912   BUN 14 04/26/2019 0912   BUN 14 10/06/2018 1101   CREATININE 0.81 04/26/2019 0912   CREATININE 0.87 12/06/2015 1022   CALCIUM 8.7 (L) 04/26/2019 0912   PROT 6.6 04/26/2019 0912   ALBUMIN 3.3 (L) 04/26/2019 0912   AST 14 (L) 04/26/2019 0912   ALT 20 04/26/2019 0912   ALKPHOS 120 04/26/2019 0912   BILITOT 0.4 04/26/2019 0912   GFRNONAA >60 04/26/2019 0912   GFRAA >60 04/26/2019 0912    No results found for:  SPEP, UPEP  Lab Results  Component Value Date   WBC 7.4 04/26/2019   NEUTROABS 5.1 04/26/2019   HGB 13.3 04/26/2019   HCT 40.1 04/26/2019   MCV 91.1 04/26/2019   PLT 237 04/26/2019      Chemistry      Component Value Date/Time   NA 138 04/26/2019 0912   NA 142 10/06/2018 1101   K 4.1 04/26/2019 0912   CL 111 04/26/2019 0912   CO2 21 (L) 04/26/2019 0912   BUN 14 04/26/2019 0912   BUN 14 10/06/2018 1101   CREATININE 0.81 04/26/2019 0912   CREATININE 0.87 12/06/2015 1022      Component Value Date/Time   CALCIUM 8.7 (L) 04/26/2019 0912   ALKPHOS 120 04/26/2019 0912   AST 14 (L) 04/26/2019 0912   ALT 20 04/26/2019 0912   BILITOT 0.4 04/26/2019 0912

## 2019-04-26 NOTE — Assessment & Plan Note (Signed)
She has recent back pain flare She is prescribed pain medicine and Lyrica by her primary care doctor I recommend also conservative approach with Tylenol and heat pad as needed I also recommend her to consult her orthopedic surgeon to see if an injection is indicated Her MRI from December showed significant degenerative disc disease

## 2019-04-27 ENCOUNTER — Telehealth: Payer: Self-pay | Admitting: Hematology and Oncology

## 2019-04-27 NOTE — Telephone Encounter (Signed)
Scheduled appts per 4/19 los. Pt's husband confirmed appt date and time.

## 2019-05-12 NOTE — Progress Notes (Signed)
Pharmacist Chemotherapy Monitoring - Follow Up Assessment    I verify that I have reviewed each item in the below checklist:  . Regimen for the patient is scheduled for the appropriate day and plan matches scheduled date. Marland Kitchen Appropriate non-routine labs are ordered dependent on drug ordered. . If applicable, additional medications reviewed and ordered per protocol based on lifetime cumulative doses and/or treatment regimen.   Plan for follow-up and/or issues identified: No . I-vent associated with next due treatment: No . MD and/or nursing notified: No  Edvardo Honse D 05/12/2019 3:50 PM

## 2019-05-17 ENCOUNTER — Ambulatory Visit
Admission: RE | Admit: 2019-05-17 | Discharge: 2019-05-17 | Disposition: A | Discharge status: Home / Self Care | Payer: Medicare Other | Source: Ambulatory Visit | Attending: Radiation Oncology | Admitting: Radiation Oncology

## 2019-05-17 ENCOUNTER — Telehealth: Payer: Self-pay | Admitting: *Deleted

## 2019-05-17 ENCOUNTER — Telehealth: Payer: Self-pay

## 2019-05-17 ENCOUNTER — Telehealth: Payer: Self-pay | Admitting: Hematology and Oncology

## 2019-05-17 DIAGNOSIS — C772 Secondary and unspecified malignant neoplasm of intra-abdominal lymph nodes: Secondary | ICD-10-CM

## 2019-05-17 NOTE — Telephone Encounter (Signed)
RETURNED PATIENT'S PHONE CALL, LVM FOR A RETURN CALL 

## 2019-05-17 NOTE — Progress Notes (Addendum)
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

## 2019-05-17 NOTE — Telephone Encounter (Signed)
Scheduled appt per 5/10 sch message -pt aware of appt date and time   

## 2019-05-17 NOTE — Telephone Encounter (Signed)
She called to cancel all her appts for tomorrow. She is not feeling well. She has a cough and chest congestion that started yesterday. She has started her breathing treatments and will see PCP if needed. Appts canceled and scheduling message sent to reschedule to next week on 5/18.

## 2019-05-18 ENCOUNTER — Ambulatory Visit: Payer: Medicare Other | Admitting: Hematology and Oncology

## 2019-05-18 ENCOUNTER — Other Ambulatory Visit: Payer: Medicare Other

## 2019-05-18 ENCOUNTER — Ambulatory Visit: Payer: Medicare Other

## 2019-05-19 NOTE — Progress Notes (Signed)
Pharmacist Chemotherapy Monitoring - Follow Up Assessment    I verify that I have reviewed each item in the below checklist:  . Regimen for the patient is scheduled for the appropriate day and plan matches scheduled date. Marland Kitchen Appropriate non-routine labs are ordered dependent on drug ordered. . If applicable, additional medications reviewed and ordered per protocol based on lifetime cumulative doses and/or treatment regimen.   Plan for follow-up and/or issues identified: No . I-vent associated with next due treatment: No . MD and/or nursing notified: No  Romualdo Bolk [facility] 05/19/2019 4:48 PM

## 2019-05-25 ENCOUNTER — Other Ambulatory Visit: Payer: Self-pay | Admitting: Hematology and Oncology

## 2019-05-25 ENCOUNTER — Encounter (INDEPENDENT_AMBULATORY_CARE_PROVIDER_SITE_OTHER): Payer: Self-pay

## 2019-05-25 ENCOUNTER — Telehealth: Payer: Self-pay

## 2019-05-25 ENCOUNTER — Inpatient Hospital Stay: Payer: Medicare Other | Attending: Gynecologic Oncology

## 2019-05-25 ENCOUNTER — Inpatient Hospital Stay: Payer: Medicare Other

## 2019-05-25 ENCOUNTER — Other Ambulatory Visit: Payer: Self-pay

## 2019-05-25 ENCOUNTER — Inpatient Hospital Stay (HOSPITAL_BASED_OUTPATIENT_CLINIC_OR_DEPARTMENT_OTHER): Payer: Medicare Other | Admitting: Hematology and Oncology

## 2019-05-25 ENCOUNTER — Encounter: Payer: Self-pay | Admitting: Hematology and Oncology

## 2019-05-25 ENCOUNTER — Telehealth: Payer: Self-pay | Admitting: Hematology and Oncology

## 2019-05-25 VITALS — BP 131/86 | HR 72 | Temp 98.3°F | Resp 18 | Ht 66.0 in | Wt 209.6 lb

## 2019-05-25 DIAGNOSIS — C541 Malignant neoplasm of endometrium: Secondary | ICD-10-CM | POA: Diagnosis present

## 2019-05-25 DIAGNOSIS — J019 Acute sinusitis, unspecified: Secondary | ICD-10-CM

## 2019-05-25 DIAGNOSIS — E039 Hypothyroidism, unspecified: Secondary | ICD-10-CM | POA: Diagnosis not present

## 2019-05-25 DIAGNOSIS — Z79899 Other long term (current) drug therapy: Secondary | ICD-10-CM | POA: Insufficient documentation

## 2019-05-25 DIAGNOSIS — Z7189 Other specified counseling: Secondary | ICD-10-CM

## 2019-05-25 DIAGNOSIS — Z5112 Encounter for antineoplastic immunotherapy: Secondary | ICD-10-CM | POA: Diagnosis present

## 2019-05-25 HISTORY — DX: Acute sinusitis, unspecified: J01.90

## 2019-05-25 LAB — CBC WITH DIFFERENTIAL (CANCER CENTER ONLY)
Abs Immature Granulocytes: 0.04 10*3/uL (ref 0.00–0.07)
Basophils Absolute: 0.1 10*3/uL (ref 0.0–0.1)
Basophils Relative: 1 %
Eosinophils Absolute: 0.4 10*3/uL (ref 0.0–0.5)
Eosinophils Relative: 5 %
HCT: 40.3 % (ref 36.0–46.0)
Hemoglobin: 13.5 g/dL (ref 12.0–15.0)
Immature Granulocytes: 0 %
Lymphocytes Relative: 19 %
Lymphs Abs: 1.7 10*3/uL (ref 0.7–4.0)
MCH: 29.9 pg (ref 26.0–34.0)
MCHC: 33.5 g/dL (ref 30.0–36.0)
MCV: 89.2 fL (ref 80.0–100.0)
Monocytes Absolute: 0.6 10*3/uL (ref 0.1–1.0)
Monocytes Relative: 7 %
Neutro Abs: 6.2 10*3/uL (ref 1.7–7.7)
Neutrophils Relative %: 68 %
Platelet Count: 250 10*3/uL (ref 150–400)
RBC: 4.52 MIL/uL (ref 3.87–5.11)
RDW: 13 % (ref 11.5–15.5)
WBC Count: 9.1 10*3/uL (ref 4.0–10.5)
nRBC: 0 % (ref 0.0–0.2)

## 2019-05-25 LAB — CMP (CANCER CENTER ONLY)
ALT: 38 U/L (ref 0–44)
AST: 30 U/L (ref 15–41)
Albumin: 3.4 g/dL — ABNORMAL LOW (ref 3.5–5.0)
Alkaline Phosphatase: 109 U/L (ref 38–126)
Anion gap: 13 (ref 5–15)
BUN: 12 mg/dL (ref 6–20)
CO2: 20 mmol/L — ABNORMAL LOW (ref 22–32)
Calcium: 8.8 mg/dL — ABNORMAL LOW (ref 8.9–10.3)
Chloride: 108 mmol/L (ref 98–111)
Creatinine: 0.89 mg/dL (ref 0.44–1.00)
GFR, Est AFR Am: >60 mL/min (ref >60)
GFR, Estimated: >60 mL/min (ref >60)
Glucose, Bld: 103 mg/dL — ABNORMAL HIGH (ref 70–99)
Potassium: 3.9 mmol/L (ref 3.5–5.1)
Sodium: 141 mmol/L (ref 135–145)
Total Bilirubin: 0.3 mg/dL (ref 0.3–1.2)
Total Protein: 6.8 g/dL (ref 6.5–8.1)

## 2019-05-25 LAB — TSH: TSH: 20.232 u[IU]/mL — ABNORMAL HIGH (ref 0.308–3.960)

## 2019-05-25 MED ORDER — SODIUM CHLORIDE 0.9 % IV SOLN
200.0000 mg | Freq: Once | INTRAVENOUS | Status: AC
  Administered 2019-05-25: 200 mg via INTRAVENOUS
  Filled 2019-05-25: qty 8

## 2019-05-25 MED ORDER — LEVOTHYROXINE SODIUM 50 MCG PO TABS: 50.0000 ug | ORAL_TABLET | Freq: Every day | ORAL | 3 refills | Status: DC

## 2019-05-25 MED ORDER — SODIUM CHLORIDE 0.9 % IV SOLN
Freq: Once | INTRAVENOUS | Status: AC
  Filled 2019-05-25: qty 250

## 2019-05-25 NOTE — Telephone Encounter (Signed)
Scheduled per 5/18 sch message. Spoke with pt's husband to tell pt to give [place] a call back regarding her appts on 6/7 and 6/8.

## 2019-05-25 NOTE — Assessment & Plan Note (Signed)
She was prescribed antibiotics therapy Overall, I do not believe she has active bacterial infection There is no contraindication for her to continue treatment as prescribed today

## 2019-05-25 NOTE — Patient Instructions (Signed)
[facility] Discharge Instructions for Patients Receiving Chemotherapy  Today you received the following chemotherapy agents :  Keytruda.  To help prevent nausea and vomiting after your treatment, we encourage you to take your nausea medication as prescribed.   If you develop nausea and vomiting that is not controlled by your nausea medication, call the clinic.   BELOW ARE SYMPTOMS THAT SHOULD BE REPORTED IMMEDIATELY:  *FEVER GREATER THAN 100.5 F  *CHILLS WITH OR WITHOUT FEVER  NAUSEA AND VOMITING THAT IS NOT CONTROLLED WITH YOUR NAUSEA MEDICATION  *UNUSUAL SHORTNESS OF BREATH  *UNUSUAL BRUISING OR BLEEDING  TENDERNESS IN MOUTH AND THROAT WITH OR WITHOUT PRESENCE OF ULCERS  *URINARY PROBLEMS  *BOWEL PROBLEMS  UNUSUAL RASH Items with * indicate a potential emergency and should be followed up as soon as possible.  Feel free to call the clinic should you have any questions or concerns. The clinic phone number is [phone].  Please show the CHEMO ALERT CARD at check-in to the Emergency Department and triage nurse.  

## 2019-05-25 NOTE — Assessment & Plan Note (Signed)
She has elevated TSH consistent with hypothyroidism, this is a common side effects of pembrolizumab I recommend thyroid replacement therapy I will continue to check TSH on a regular basis and adjust her medication as needed

## 2019-05-25 NOTE — Progress Notes (Signed)
[facility] OFF[facility] PROGRESS NOTE  Patient Care Team: Imagene Riches, NP as PCP - General Berniece Salines, DO as PCP - Cardiology (Cardiology) Gavin Pound, MD as Consulting Physician (Rheumatology) Awanda Mink Craige Cotta, RN as Oncology Nurse Navigator (Oncology)  ASSESSMENT & PLAN:  Endometrial cancer ([place]) Overall, she tolerated treatment well without major side effects We will proceed with treatment today Afterwards, I recommend CT imaging next month for further discussion about plan of care  Acquired hypothyroidism She has elevated TSH consistent with hypothyroidism, this is a common side effects of pembrolizumab I recommend thyroid replacement therapy I will continue to check TSH on a regular basis and adjust her medication as needed  Sinusitis, acute She was prescribed antibiotics therapy Overall, I do not believe she has active bacterial infection There is no contraindication for her to continue treatment as prescribed today   Orders Placed This Encounter  Procedures  . CT ABDOMEN PELVIS W CONTRAST    Standing Status:   Future    Standing Expiration Date:   05/24/2020    Order Specific Question:   If indicated for the ordered procedure, I authorize the administration of contrast media per Radiology protocol    Answer:   Yes    Order Specific Question:   Preferred imaging location?    Answer:   [facility]    Order Specific Question:   Radiology Contrast Protocol - do NOT remove file path    Answer:   [email or web address]    Order Specific Question:   Is patient pregnant?    Answer:   No    All questions were answered. The patient knows to call the clinic with any problems, questions or concerns. The total time spent in the appointment was 20 minutes encounter with patients including review of chart and various tests results, discussions about plan of care and coordination of care plan   Heath Lark, MD 05/25/2019 10:48  AM  INTERVAL HISTORY: Please see below for problem oriented charting. She returns for treatment and follow-up Her treatment was delayed by 1 week due to recent stomach issues and upper respiratory tract congestion She saw somebody and was prescribed antibiotics treatment No fever or chills  SUMMARY OF ONCOLOGIC HISTORY: Oncology History Overview Note  MSI high disease detected   Endometrial cancer ([place])  07/05/2015 Pathology Results   She had abnormal PAP   11/24/2015 Procedure   She underwent endometrial sampling that came back abnormal   01/10/2016 Procedure   She had EGD which showed esophagitis   01/18/2016 Pathology Results   Diagnosis 1. Lymph node, sentinel, biopsy, right obturator - MICROMETASTASIS IN ONE OF ONE LYMPH NODES (1/1). 2. Lymph node, sentinel, biopsy, left external - ONE OF ONE LYMPH NODES NEGATIVE FOR CARCINOMA (0/1). 3. Lymph node, sentinel, biopsy, left obturator - MICROMETASTASIS IN ONE OF ONE LYMPH NODES (1/1). 4. Uterus +/- tubes/ovaries, neoplastic - UTERUS: -ENDOMYOMETRIUM: ENDOMETRIOID ADENOCARCINOMA, FIGO GRADE 1, SPANNING 3.3 CM. TUMOR INVADES LESS THAN 1/2 OF THE MYOMETRIUM. SEE ONCOLOGY TABLE. -SEROSA: UNREMARKABLE. NO MALIGNANCY IDENTIFIED. - CERVIX: BENIGN SQUAMOUS AND ENDOCERVICAL MUCOSA. NO DYSPLASIA OR MALIGNANCY. - BILATERAL OVARIES: INCLUSION CYSTS. NO MALIGNANCY. - BILATERAL FALLOPIAN TUBES: UNREMARKABLE RIGHT TUBE. LEFT TUBE IS NOT IDENTIFIED GROSSLY OR MICROSCOPICALLY.   01/18/2016 Surgery   She underwent robotic-assisted laparoscopic total hysterectomy with bilateral salpingoophorectomy, sentinel lymph node biopsy, lysis of adhesions   04/24/2016 Genetic Testing   Negative genetic testing on the TumorNext Lynch with CancerNext.  This is paired germline and  tumor analyses for enhanced diagnosis of lynch syndrome plus analyses of 29 additional genes associated with hereditary cancer.  The CancerNext gene panel offered by [employer]  includes sequencing and rearrangement analysis for the following 34 genes:   APC, ATM, BARD1, BMPR1A, BRCA1, BRCA2, BRIP1, CDH1, CDK4, CDKN2A, CHEK2, DICER, HOXB13, EPCAM, GREM1, MLH1, MRE11A, MSH2, MSH6, MUTYH, NBN, NF1, PALB2, PMS2, POLD1, POLE, PTEN, RAD50, RAD51C, RAD51D, SMAD4, SMARCA4, STK11, and TP53.   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  Taken with the germline results demonstrating absence of pathogenic mutations or likely pathogenic variants (VLPs) in the mismatch repair (MMR) genes, the likelihood that this individual has Lynch syndrome/HNPCC is greatly decreased; however, the possibility of an undetected variant of either germline or somatic origin due to mosaicism and other rare etiologies cannot be ruled out by the current methodology.  Correlation with clinical history and external tumor testing results is advised.    10/29/2018 PET scan   Outside PET scan showed enlarged aorta-caval LN   12/17/2018 - 01/15/2019 Radiation Therapy   Radiation Treatment Dates: 12/17/2018 through 01/15/2019 Site Technique Total Dose (Gy) Dose per Fx (Gy) Completed Fx Beam Energies  Abdomen: Abd_PA node 3D 50/50 2.5 20/20 6X, 15X        03/01/2019 Imaging   1. No CT findings for metastatic disease involving the chest, abdomen or pelvis. 2. Stable moderate-sized hiatal hernia. 3. Status post cholecystectomy. No biliary dilatation. 4. Stable hepatic cysts.   03/15/2019 -  Chemotherapy   The patient had pembrolizumab (KEYTRUDA) 200 mg in sodium chloride 0.9 % 50 mL chemo infusion, 200 mg, Intravenous, Once, 4 of 6 cycles Administration: 200 mg (03/15/2019), 200 mg (04/26/2019), 200 mg (05/25/2019), 200 mg (04/05/2019)  for chemotherapy treatment.      REVIEW OF SYSTEMS:   Constitutional: Denies fevers, chills or abnormal weight loss Eyes: Denies blurriness of vision Ears, nose, mouth, throat, and face: Denies mucositis or sore  throat Cardiovascular: Denies palpitation, chest discomfort or lower extremity swelling Gastrointestinal:  Denies nausea, heartburn or change in bowel habits Skin: Denies abnormal skin rashes Lymphatics: Denies new lymphadenopathy or easy bruising Neurological:Denies numbness, tingling or new weaknesses Behavioral/Psych: Mood is stable, no new changes  All other systems were reviewed with the patient and are negative.  I have reviewed the past medical history, past surgical history, social history and family history with the patient and they are unchanged from previous note.  ALLERGIES:  is allergic to codeine and sulfa antibiotics.  MEDICATIONS:  Current Outpatient Medications  Medication Sig Dispense Refill  . amoxicillin-clavulanate (AUGMENTIN) 875-125 MG tablet Take 1 tablet by mouth 2 (two) times daily.    . B Complex Vitamins (VITAMIN-B COMPLEX) TABS Take 1 tablet by mouth daily.     Marland Kitchen dicyclomine (BENTYL) 10 MG capsule Take 10 mg by mouth 2 (two) times daily as needed for spasms.     Marland Kitchen escitalopram (LEXAPRO) 20 MG tablet TAKE ONE AND ONE HALF (1.5) TABLETS BY MOUTH EVERY MORNING  1  . famotidine (PEPCID) 40 MG tablet TAKE 1 (ONE) TABLET TWICE DAILY, WITH BREAKFAST AND AT BEDTIME    . fluticasone (FLONASE) 50 MCG/ACT nasal spray INSTILL 2 SPRAYS INTO EACH NOSTRIL ONCE A DAY  0  . ketoconazole (NIZORAL) 2 % shampoo ketoconazole 2 % shampoo  APPLY TO WASH TWICE WEEKLY    . levothyroxine (SYNTHROID) 50 MCG tablet Take 1 tablet (50 mcg total) by mouth daily before breakfast. 30 tablet 3  .  losartan (COZAAR) 25 MG tablet Take 25 mg by mouth daily.  0  . nystatin cream (MYCOSTATIN)     . ondansetron (ZOFRAN) 8 MG tablet Take 1 tablet (8 mg total) by mouth 2 (two) times daily as needed (Nausea or vomiting). 30 tablet 1  . pantoprazole (PROTONIX) 40 MG tablet Take 40 mg by mouth 2 (two) times daily.   11  . pravastatin (PRAVACHOL) 20 MG tablet TAKE 1 TABLET BY MOUTH DAILY    . pregabalin  (LYRICA) 150 MG capsule Take 150 mg by mouth 2 (two) times daily.    . prochlorperazine (COMPAZINE) 10 MG tablet Take 1 tablet (10 mg total) by mouth every 6 (six) hours as needed for nausea or vomiting. 30 tablet 0  . prochlorperazine (COMPAZINE) 10 MG tablet Take 1 tablet (10 mg total) by mouth every 6 (six) hours as needed (Nausea or vomiting). 30 tablet 1  . rosuvastatin (CRESTOR) 40 MG tablet Take 40 mg by mouth daily.    . solifenacin (VESICARE) 10 MG tablet Vesicare 10 mg tablet  TAKE 1 TABLET BY MOUTH EVERY DAY     No current facility-administered medications for this visit.    PHYSICAL EXAMINATION: ECOG PERFORMANCE STATUS: 1 - Symptomatic but completely ambulatory  Vitals:   05/25/19 0821  BP: 131/86  Pulse: 72  Resp: 18  Temp: 98.3 F (36.8 C)  SpO2: 100%   Filed Weights   05/25/19 0821  Weight: 209 lb 9.6 oz (95.1 kg)    GENERAL:alert, no distress and comfortable SKIN: skin color, texture, turgor are normal, no rashes or significant lesions EYES: normal, Conjunctiva are pink and non-injected, sclera clear OROPHARYNX:no exudate, no erythema and lips, buccal mucosa, and tongue normal  NECK: supple, thyroid normal size, non-tender, without nodularity LYMPH:  no palpable lymphadenopathy in the cervical, axillary or inguinal LUNGS: clear to auscultation and percussion with normal breathing effort HEART: regular rate & rhythm and no murmurs and no lower extremity edema ABDOMEN:abdomen soft, non-tender and normal bowel sounds Musculoskeletal:no cyanosis of digits and no clubbing  NEURO: alert & oriented x 3 with fluent speech, no focal motor/sensory deficits  LABORATORY DATA:  I have reviewed the data as listed    Component Value Date/Time   NA 141 05/25/2019 0805   NA 142 10/06/2018 1101   K 3.9 05/25/2019 0805   CL 108 05/25/2019 0805   CO2 20 (L) 05/25/2019 0805   GLUCOSE 103 (H) 05/25/2019 0805   BUN 12 05/25/2019 0805   BUN 14 10/06/2018 1101   CREATININE  0.89 05/25/2019 0805   CREATININE 0.87 12/06/2015 1022   CALCIUM 8.8 (L) 05/25/2019 0805   PROT 6.8 05/25/2019 0805   ALBUMIN 3.4 (L) 05/25/2019 0805   AST 30 05/25/2019 0805   ALT 38 05/25/2019 0805   ALKPHOS 109 05/25/2019 0805   BILITOT 0.3 05/25/2019 0805   GFRNONAA >60 05/25/2019 0805   GFRAA >60 05/25/2019 0805    No results found for: SPEP, UPEP  Lab Results  Component Value Date   WBC 9.1 05/25/2019   NEUTROABS 6.2 05/25/2019   HGB 13.5 05/25/2019   HCT 40.3 05/25/2019   MCV 89.2 05/25/2019   PLT 250 05/25/2019      Chemistry      Component Value Date/Time   NA 141 05/25/2019 0805   NA 142 10/06/2018 1101   K 3.9 05/25/2019 0805   CL 108 05/25/2019 0805   CO2 20 (L) 05/25/2019 0805   BUN 12 05/25/2019 0805  BUN 14 10/06/2018 1101   CREATININE 0.89 05/25/2019 0805   CREATININE 0.87 12/06/2015 1022      Component Value Date/Time   CALCIUM 8.8 (L) 05/25/2019 0805   ALKPHOS 109 05/25/2019 0805   AST 30 05/25/2019 0805   ALT 38 05/25/2019 0805   BILITOT 0.3 05/25/2019 0805

## 2019-05-25 NOTE — Telephone Encounter (Signed)
She called and left a message. Asking about upcoming appts. Called back and given appts dates. She verbalized understatnding.

## 2019-05-25 NOTE — Assessment & Plan Note (Signed)
Overall, she tolerated treatment well without major side effects We will proceed with treatment today Afterwards, I recommend CT imaging next month for further discussion about plan of care

## 2019-05-26 NOTE — Progress Notes (Signed)
Error, patient ill and did not come to the visit.

## 2019-05-27 ENCOUNTER — Telehealth: Payer: Self-pay | Admitting: Radiation Oncology

## 2019-05-27 ENCOUNTER — Ambulatory Visit
Admission: RE | Admit: 2019-05-27 | Discharge: 2019-05-27 | Disposition: A | Discharge status: Home / Self Care | Payer: Medicare Other | Source: Ambulatory Visit | Attending: Radiation Oncology | Admitting: Radiation Oncology

## 2019-05-27 ENCOUNTER — Telehealth: Payer: Self-pay | Admitting: *Deleted

## 2019-05-27 NOTE — Telephone Encounter (Signed)
CALLED PATIENT TO ASK ABOUT RESCHEDULING FU FOR 05-27-19, PATIENT DIDN'T WANT TO COME IN EARLY OR LATE, PATIENT AGREED TO COME IN ON 06-17-19 @ 9:15 AM

## 2019-05-27 NOTE — Telephone Encounter (Signed)
Patient did not show for 1100 follow up appointment with Dr. Sondra Come. Called to inquire.   Patient reports she forgot about the appointment. She goes onto explain she has been under the weather with an upper respiratory infection. She explains she is on augmentin with four days left in her script. Additionally, she reports she was prescribed prednisone yesterday to take in addition to the augmentin. She confirms that both her and her husband have received their covid vaccinations.   Patient agreed to following plan: rad onc scheduler will phone back to reschedule her for two weeks out from today allowing for completion of antibiotics.  Dr. Sondra Come informed of these findings.

## 2019-05-31 ENCOUNTER — Other Ambulatory Visit: Payer: Self-pay

## 2019-06-01 ENCOUNTER — Ambulatory Visit: Payer: Medicare Other | Admitting: Cardiology

## 2019-06-09 NOTE — Progress Notes (Signed)
Pharmacist Chemotherapy Monitoring - Follow Up Assessment    I verify that I have reviewed each item in the below checklist:  . Regimen for the patient is scheduled for the appropriate day and plan matches scheduled date. Marland Kitchen Appropriate non-routine labs are ordered dependent on drug ordered. . If applicable, additional medications reviewed and ordered per protocol based on lifetime cumulative doses and/or treatment regimen.   Plan for follow-up and/or issues identified: No . I-vent associated with next due treatment: No . MD and/or nursing notified: No  Carrisa Keller D 06/09/2019 11:32 AM= [

## 2019-06-14 ENCOUNTER — Inpatient Hospital Stay: Payer: Medicare Other | Attending: Gynecologic Oncology

## 2019-06-14 ENCOUNTER — Telehealth: Payer: Self-pay

## 2019-06-14 ENCOUNTER — Other Ambulatory Visit: Payer: Self-pay

## 2019-06-14 ENCOUNTER — Other Ambulatory Visit: Payer: Medicare Other

## 2019-06-14 ENCOUNTER — Ambulatory Visit (HOSPITAL_COMMUNITY)
Admission: RE | Admit: 2019-06-14 | Discharge: 2019-06-14 | Disposition: A | Discharge status: Home / Self Care | Payer: Medicare Other | Source: Ambulatory Visit | Attending: Hematology and Oncology | Admitting: Hematology and Oncology

## 2019-06-14 ENCOUNTER — Encounter (HOSPITAL_COMMUNITY): Payer: Self-pay

## 2019-06-14 DIAGNOSIS — Z79899 Other long term (current) drug therapy: Secondary | ICD-10-CM | POA: Insufficient documentation

## 2019-06-14 DIAGNOSIS — C541 Malignant neoplasm of endometrium: Secondary | ICD-10-CM | POA: Diagnosis present

## 2019-06-14 DIAGNOSIS — Z5112 Encounter for antineoplastic immunotherapy: Secondary | ICD-10-CM | POA: Insufficient documentation

## 2019-06-14 DIAGNOSIS — Z7189 Other specified counseling: Secondary | ICD-10-CM

## 2019-06-14 LAB — CMP (CANCER CENTER ONLY)
ALT: 19 U/L (ref 0–44)
AST: 15 U/L (ref 15–41)
Albumin: 3.4 g/dL — ABNORMAL LOW (ref 3.5–5.0)
Alkaline Phosphatase: 98 U/L (ref 38–126)
Anion gap: 10 (ref 5–15)
BUN: 11 mg/dL (ref 6–20)
CO2: 23 mmol/L (ref 22–32)
Calcium: 8.9 mg/dL (ref 8.9–10.3)
Chloride: 108 mmol/L (ref 98–111)
Creatinine: 0.93 mg/dL (ref 0.44–1.00)
GFR, Est AFR Am: >60 mL/min (ref >60)
GFR, Estimated: >60 mL/min (ref >60)
Glucose, Bld: 115 mg/dL — ABNORMAL HIGH (ref 70–99)
Potassium: 4.5 mmol/L (ref 3.5–5.1)
Sodium: 141 mmol/L (ref 135–145)
Total Bilirubin: 0.5 mg/dL (ref 0.3–1.2)
Total Protein: 6.4 g/dL — ABNORMAL LOW (ref 6.5–8.1)

## 2019-06-14 LAB — CBC WITH DIFFERENTIAL (CANCER CENTER ONLY)
Abs Immature Granulocytes: 0.03 10*3/uL (ref 0.00–0.07)
Basophils Absolute: 0.1 10*3/uL (ref 0.0–0.1)
Basophils Relative: 1 %
Eosinophils Absolute: 0.4 10*3/uL (ref 0.0–0.5)
Eosinophils Relative: 4 %
HCT: 42.4 % (ref 36.0–46.0)
Hemoglobin: 13.4 g/dL (ref 12.0–15.0)
Immature Granulocytes: 0 %
Lymphocytes Relative: 19 %
Lymphs Abs: 1.7 10*3/uL (ref 0.7–4.0)
MCH: 29.7 pg (ref 26.0–34.0)
MCHC: 31.6 g/dL (ref 30.0–36.0)
MCV: 94 fL (ref 80.0–100.0)
Monocytes Absolute: 0.6 10*3/uL (ref 0.1–1.0)
Monocytes Relative: 6 %
Neutro Abs: 6.1 10*3/uL (ref 1.7–7.7)
Neutrophils Relative %: 70 %
Platelet Count: 217 10*3/uL (ref 150–400)
RBC: 4.51 MIL/uL (ref 3.87–5.11)
RDW: 13.5 % (ref 11.5–15.5)
WBC Count: 8.8 10*3/uL (ref 4.0–10.5)
nRBC: 0 % (ref 0.0–0.2)

## 2019-06-14 LAB — TSH: TSH: 63.789 u[IU]/mL — ABNORMAL HIGH (ref 0.308–3.960)

## 2019-06-14 MED ORDER — IOHEXOL 300 MG/ML  SOLN
100.0000 mL | Freq: Once | INTRAMUSCULAR | Status: AC | PRN
  Administered 2019-06-14: 100 mL via INTRAVENOUS

## 2019-06-14 MED ORDER — SODIUM CHLORIDE (PF) 0.9 % IJ SOLN
INTRAMUSCULAR | Status: AC
  Filled 2019-06-14: qty 50

## 2019-06-14 MED ORDER — LEVOTHYROXINE SODIUM 75 MCG PO TABS
75.0000 ug | ORAL_TABLET | Freq: Every day | ORAL | 11 refills | Status: DC
Start: 2019-06-14 — End: 2020-05-26

## 2019-06-14 NOTE — Telephone Encounter (Signed)
Called and left below message. Ask her to call the office back. 

## 2019-06-14 NOTE — Telephone Encounter (Signed)
Called and given below message. She verbalized understanding. Rx sent to pharmacy. °

## 2019-06-14 NOTE — Telephone Encounter (Signed)
Pls call in 75 mcg to her pharmacy, 30 tabs, 11 refills

## 2019-06-14 NOTE — Telephone Encounter (Signed)
Called back and given below message. She verbalized understanding. She picked up Rx and is taking synthroid daily.

## 2019-06-14 NOTE — Telephone Encounter (Signed)
-----   Message from Heath Lark, MD sent at 06/14/2019  9:57 AM EDT ----- Regarding: TSH is high, can you call and verify she pick up the synthroid medication?

## 2019-06-15 ENCOUNTER — Other Ambulatory Visit: Payer: Self-pay

## 2019-06-15 ENCOUNTER — Inpatient Hospital Stay (HOSPITAL_BASED_OUTPATIENT_CLINIC_OR_DEPARTMENT_OTHER): Payer: Medicare Other | Admitting: Hematology and Oncology

## 2019-06-15 ENCOUNTER — Encounter: Payer: Self-pay | Admitting: Hematology and Oncology

## 2019-06-15 ENCOUNTER — Inpatient Hospital Stay: Payer: Medicare Other

## 2019-06-15 ENCOUNTER — Other Ambulatory Visit: Payer: Self-pay | Admitting: Hematology and Oncology

## 2019-06-15 DIAGNOSIS — Z5112 Encounter for antineoplastic immunotherapy: Secondary | ICD-10-CM | POA: Diagnosis not present

## 2019-06-15 DIAGNOSIS — Z7189 Other specified counseling: Secondary | ICD-10-CM

## 2019-06-15 DIAGNOSIS — C541 Malignant neoplasm of endometrium: Secondary | ICD-10-CM

## 2019-06-15 DIAGNOSIS — E039 Hypothyroidism, unspecified: Secondary | ICD-10-CM

## 2019-06-15 MED ORDER — SODIUM CHLORIDE 0.9 % IV SOLN
Freq: Once | INTRAVENOUS | Status: AC
  Filled 2019-06-15: qty 250

## 2019-06-15 MED ORDER — SODIUM CHLORIDE 0.9 % IV SOLN
200.0000 mg | Freq: Once | INTRAVENOUS | Status: AC
  Administered 2019-06-15: 200 mg via INTRAVENOUS
  Filled 2019-06-15: qty 8

## 2019-06-15 NOTE — Patient Instructions (Signed)
[facility] Discharge Instructions for Patients Receiving Chemotherapy  Today you received the following chemotherapy agents :  Keytruda.  To help prevent nausea and vomiting after your treatment, we encourage you to take your nausea medication as prescribed.   If you develop nausea and vomiting that is not controlled by your nausea medication, call the clinic.   BELOW ARE SYMPTOMS THAT SHOULD BE REPORTED IMMEDIATELY:  *FEVER GREATER THAN 100.5 F  *CHILLS WITH OR WITHOUT FEVER  NAUSEA AND VOMITING THAT IS NOT CONTROLLED WITH YOUR NAUSEA MEDICATION  *UNUSUAL SHORTNESS OF BREATH  *UNUSUAL BRUISING OR BLEEDING  TENDERNESS IN MOUTH AND THROAT WITH OR WITHOUT PRESENCE OF ULCERS  *URINARY PROBLEMS  *BOWEL PROBLEMS  UNUSUAL RASH Items with * indicate a potential emergency and should be followed up as soon as possible.  Feel free to call the clinic should you have any questions or concerns. The clinic phone number is [phone].  Please show the CHEMO ALERT CARD at check-in to the Emergency Department and triage nurse.  

## 2019-06-15 NOTE — Assessment & Plan Note (Signed)
ThroatHer TSH is elevated and she is symptomatic I plan to increase her We will continue to monitor and adjust her medications

## 2019-06-15 NOTE — Progress Notes (Signed)
[place] OFFICE PROGRESS NOTE  Patient Care Team: Imagene Riches, NP as PCP - General Berniece Salines, DO as PCP - Cardiology (Cardiology) Gavin Pound, MD as Consulting Physician (Rheumatology) Awanda Mink Craige Cotta, RN as Oncology Nurse Navigator (Oncology)  ASSESSMENT & PLAN:  Endometrial cancer [facility]) I have reviewed her imaging studies She has no evidence of disease She tolerated immunotherapy well except for side effects with acquired hypothyroidism We discussed the risk and benefits of continuing treatment versus stopping treatment with active surveillance imaging studies She is undecided We will proceed with pembrolizumab as schedule She will call me sometime later in the week for final decision.  Acquired hypothyroidism ThroatHer TSH is elevated and she is symptomatic I plan to increase her We will continue to monitor and adjust her medications   No orders of the defined types were placed in this encounter.   All questions were answered. The patient knows to call the clinic with any problems, questions or concerns. The total time spent in the appointment was 30 minutes encounter with patients including review of chart and various tests results, discussions about plan of care and coordination of care plan   Heath Lark, MD 06/15/2019 10:02 AM  INTERVAL HISTORY: Please see below for problem oriented charting. She returns for further follow-up She complained of fatigue Otherwise, she tolerated treatment very well No recent infusion reaction Appetite is stable  SUMMARY OF ONCOLOGIC HISTORY: Oncology History Overview Note  MSI high disease detected   Endometrial cancer ([place])  07/05/2015 Pathology Results   She had abnormal PAP   11/24/2015 Procedure   She underwent endometrial sampling that came back abnormal   01/10/2016 Procedure   She had EGD which showed esophagitis   01/18/2016 Pathology Results   Diagnosis 1. Lymph node, sentinel, biopsy, right  obturator - MICROMETASTASIS IN ONE OF ONE LYMPH NODES (1/1). 2. Lymph node, sentinel, biopsy, left external - ONE OF ONE LYMPH NODES NEGATIVE FOR CARCINOMA (0/1). 3. Lymph node, sentinel, biopsy, left obturator - MICROMETASTASIS IN ONE OF ONE LYMPH NODES (1/1). 4. Uterus +/- tubes/ovaries, neoplastic - UTERUS: -ENDOMYOMETRIUM: ENDOMETRIOID ADENOCARCINOMA, FIGO GRADE 1, SPANNING 3.3 CM. TUMOR INVADES LESS THAN 1/2 OF THE MYOMETRIUM. SEE ONCOLOGY TABLE. -SEROSA: UNREMARKABLE. NO MALIGNANCY IDENTIFIED. - CERVIX: BENIGN SQUAMOUS AND ENDOCERVICAL MUCOSA. NO DYSPLASIA OR MALIGNANCY. - BILATERAL OVARIES: INCLUSION CYSTS. NO MALIGNANCY. - BILATERAL FALLOPIAN TUBES: UNREMARKABLE RIGHT TUBE. LEFT TUBE IS NOT IDENTIFIED GROSSLY OR MICROSCOPICALLY.   01/18/2016 Surgery   She underwent robotic-assisted laparoscopic total hysterectomy with bilateral salpingoophorectomy, sentinel lymph node biopsy, lysis of adhesions   04/24/2016 Genetic Testing   Negative genetic testing on the TumorNext Lynch with CancerNext.  This is paired germline and tumor analyses for enhanced diagnosis of lynch syndrome plus analyses of 29 additional genes associated with hereditary cancer.  The CancerNext gene panel offered by [employer] includes sequencing and rearrangement analysis for the following 34 genes:   APC, ATM, BARD1, BMPR1A, BRCA1, BRCA2, BRIP1, CDH1, CDK4, CDKN2A, CHEK2, DICER, HOXB13, EPCAM, GREM1, MLH1, MRE11A, MSH2, MSH6, MUTYH, NBN, NF1, PALB2, PMS2, POLD1, POLE, PTEN, RAD50, RAD51C, RAD51D, SMAD4, SMARCA4, STK11, and TP53.   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  Taken with the germline results demonstrating absence of pathogenic mutations or likely pathogenic variants (VLPs) in the mismatch repair (MMR) genes, the likelihood that this individual has Lynch syndrome/HNPCC is greatly decreased; however, the possibility of an undetected variant of either  germline or somatic origin due  to mosaicism and other rare etiologies cannot be ruled out by the current methodology.  Correlation with clinical history and external tumor testing results is advised.    10/29/2018 PET scan   Outside PET scan showed enlarged aorta-caval LN   12/17/2018 - 01/15/2019 Radiation Therapy   Radiation Treatment Dates: 12/17/2018 through 01/15/2019 Site Technique Total Dose (Gy) Dose per Fx (Gy) Completed Fx Beam Energies  Abdomen: Abd_PA node 3D 50/50 2.5 20/20 6X, 15X        03/01/2019 Imaging   1. No CT findings for metastatic disease involving the chest, abdomen or pelvis. 2. Stable moderate-sized hiatal hernia. 3. Status post cholecystectomy. No biliary dilatation. 4. Stable hepatic cysts.   03/15/2019 -  Chemotherapy   The patient had pembrolizumab (KEYTRUDA) 200 mg in sodium chloride 0.9 % 50 mL chemo infusion, 200 mg, Intravenous, Once, 5 of 6 cycles Administration: 200 mg (03/15/2019), 200 mg (04/26/2019), 200 mg (05/25/2019), 200 mg (04/05/2019)  for chemotherapy treatment.    06/14/2019 Imaging   1. No acute findings within the abdomen or pelvis. No findings of recurrent tumor or metastatic disease. 2. Hiatal hernia     REVIEW OF SYSTEMS:   Constitutional: Denies fevers, chills or abnormal weight loss Eyes: Denies blurriness of vision Ears, nose, mouth, throat, and face: Denies mucositis or sore throat Respiratory: Denies cough, dyspnea or wheezes Cardiovascular: Denies palpitation, chest discomfort or lower extremity swelling Gastrointestinal:  Denies nausea, heartburn or change in bowel habits Skin: Denies abnormal skin rashes Lymphatics: Denies new lymphadenopathy or easy bruising Neurological:Denies numbness, tingling or new weaknesses Behavioral/Psych: Mood is stable, no new changes  All other systems were reviewed with the patient and are negative.  I have reviewed the past medical history, past surgical history, social history and family history  with the patient and they are unchanged from previous note.  ALLERGIES:  is allergic to codeine; other; and sulfa antibiotics.  MEDICATIONS:  Current Outpatient Medications  Medication Sig Dispense Refill   albuterol (PROVENTIL) (2.5 MG/3ML) 0.083% nebulizer solution      amoxicillin-clavulanate (AUGMENTIN) 875-125 MG tablet Take 1 tablet by mouth 2 (two) times daily.     B Complex Vitamins (VITAMIN-B COMPLEX) TABS Take 1 tablet by mouth daily.      dicyclomine (BENTYL) 10 MG capsule Take 10 mg by mouth 2 (two) times daily as needed for spasms.      escitalopram (LEXAPRO) 20 MG tablet TAKE ONE AND ONE HALF (1.5) TABLETS BY MOUTH EVERY MORNING  1   famotidine (PEPCID) 40 MG tablet TAKE 1 (ONE) TABLET TWICE DAILY, WITH BREAKFAST AND AT BEDTIME     fluticasone (FLONASE) 50 MCG/ACT nasal spray INSTILL 2 SPRAYS INTO EACH NOSTRIL ONCE A DAY  0   ketoconazole (NIZORAL) 2 % shampoo ketoconazole 2 % shampoo  APPLY TO WASH TWICE WEEKLY     levothyroxine (SYNTHROID) 75 MCG tablet Take 1 tablet (75 mcg total) by mouth daily before breakfast. 30 tablet 11   losartan (COZAAR) 25 MG tablet Take 25 mg by mouth daily.  0   nystatin cream (MYCOSTATIN)      ondansetron (ZOFRAN) 8 MG tablet Take 1 tablet (8 mg total) by mouth 2 (two) times daily as needed (Nausea or vomiting). 30 tablet 1   pantoprazole (PROTONIX) 40 MG tablet Take 40 mg by mouth 2 (two) times daily.   11   pravastatin (PRAVACHOL) 20 MG tablet TAKE 1 TABLET BY MOUTH DAILY     predniSONE (STERAPRED UNI-PAK 21 TAB) 10  MG (21) TBPK tablet      pregabalin (LYRICA) 150 MG capsule Take 150 mg by mouth 2 (two) times daily.     prochlorperazine (COMPAZINE) 10 MG tablet Take 1 tablet (10 mg total) by mouth every 6 (six) hours as needed for nausea or vomiting. 30 tablet 0   prochlorperazine (COMPAZINE) 10 MG tablet Take 1 tablet (10 mg total) by mouth every 6 (six) hours as needed (Nausea or vomiting). 30 tablet 1   QUEtiapine  (SEROQUEL) 50 MG tablet Take 50 mg by mouth daily.     rosuvastatin (CRESTOR) 40 MG tablet Take 40 mg by mouth daily.     solifenacin (VESICARE) 10 MG tablet Vesicare 10 mg tablet  TAKE 1 TABLET BY MOUTH EVERY DAY     No current facility-administered medications for this visit.    PHYSICAL EXAMINATION: ECOG PERFORMANCE STATUS: 1 - Symptomatic but completely ambulatory  Vitals:   06/15/19 0830  BP: 130/77  Pulse: 70  Resp: 18  Temp: 98 F (36.7 C)  SpO2: 100%   Filed Weights   06/15/19 0830  Weight: 215 lb 9.6 oz (97.8 kg)    GENERAL:alert, no distress and comfortable NEURO: alert & oriented x 3 with fluent speech, no focal motor/sensory deficits  LABORATORY DATA:  I have reviewed the data as listed    Component Value Date/Time   NA 141 06/14/2019 0824   NA 142 10/06/2018 1101   K 4.5 06/14/2019 0824   CL 108 06/14/2019 0824   CO2 23 06/14/2019 0824   GLUCOSE 115 (H) 06/14/2019 0824   BUN 11 06/14/2019 0824   BUN 14 10/06/2018 1101   CREATININE 0.93 06/14/2019 0824   CREATININE 0.87 12/06/2015 1022   CALCIUM 8.9 06/14/2019 0824   PROT 6.4 (L) 06/14/2019 0824   ALBUMIN 3.4 (L) 06/14/2019 0824   AST 15 06/14/2019 0824   ALT 19 06/14/2019 0824   ALKPHOS 98 06/14/2019 0824   BILITOT 0.5 06/14/2019 0824   GFRNONAA >60 06/14/2019 0824   GFRAA >60 06/14/2019 0824    No results found for: SPEP, UPEP  Lab Results  Component Value Date   WBC 8.8 06/14/2019   NEUTROABS 6.1 06/14/2019   HGB 13.4 06/14/2019   HCT 42.4 06/14/2019   MCV 94.0 06/14/2019   PLT 217 06/14/2019      Chemistry      Component Value Date/Time   NA 141 06/14/2019 0824   NA 142 10/06/2018 1101   K 4.5 06/14/2019 0824   CL 108 06/14/2019 0824   CO2 23 06/14/2019 0824   BUN 11 06/14/2019 0824   BUN 14 10/06/2018 1101   CREATININE 0.93 06/14/2019 0824   CREATININE 0.87 12/06/2015 1022      Component Value Date/Time   CALCIUM 8.9 06/14/2019 0824   ALKPHOS 98 06/14/2019 0824   AST  15 06/14/2019 0824   ALT 19 06/14/2019 0824   BILITOT 0.5 06/14/2019 0824       RADIOGRAPHIC STUDIES: I have personally reviewed the radiological images as listed and agreed with the findings in the report. CT ABDOMEN PELVIS W CONTRAST  Result Date: 06/14/2019 CLINICAL DATA:  Restaging endometrial cancer. Status post TAH, XRT with immunotherapy in progress. EXAM: CT ABDOMEN AND PELVIS WITH CONTRAST TECHNIQUE: Multidetector CT imaging of the abdomen and pelvis was performed using the standard protocol following bolus administration of intravenous contrast. CONTRAST:  167m OMNIPAQUE IOHEXOL 300 MG/ML  SOLN COMPARISON:  03/01/2019 FINDINGS: Lower chest: No acute abnormality. Hepatobiliary: 1.5 cm  cyst identified within the anterior dome of liver, image 19/3. Lateral segment left lobe of liver cyst is unchanged measuring 0.8 cm. No suspicious liver lesions identified. Cholecystectomy. No biliary ductal dilatation. Pancreas: Unremarkable. No pancreatic ductal dilatation or surrounding inflammatory changes. Spleen: Normal in size without focal abnormality. Adrenals/Urinary Tract: Normal appearance of the adrenal glands. No kidney mass or hydronephrosis. Urinary bladder negative Stomach/Bowel: Moderate hiatal hernia. Stomach is otherwise within normal limits. Appendix appears normal. No evidence of bowel wall thickening, distention, or inflammatory changes. Vascular/Lymphatic: No significant vascular findings are present. No enlarged abdominal or pelvic lymph nodes. Reproductive: Status post hysterectomy. No adnexal masses. Other: No ascites or focal fluid collections. There is no peritoneal nodule or mass identified. Musculoskeletal: Degenerative disc disease identified within the scratch set mild multilevel spondylosis identified within the thoracolumbar spine. No acute or suspicious osseous findings. IMPRESSION: 1. No acute findings within the abdomen or pelvis. No findings of recurrent tumor or metastatic  disease. 2. Hiatal hernia. Electronically Signed   By: Kerby Moors M.D.   On: 06/14/2019 11:06

## 2019-06-15 NOTE — Assessment & Plan Note (Signed)
I have reviewed her imaging studies She has no evidence of disease She tolerated immunotherapy well except for side effects with acquired hypothyroidism We discussed the risk and benefits of continuing treatment versus stopping treatment with active surveillance imaging studies She is undecided We will proceed with pembrolizumab as schedule She will call me sometime later in the week for final decision.

## 2019-06-16 ENCOUNTER — Ambulatory Visit: Payer: Medicare Other | Admitting: Cardiology

## 2019-06-17 ENCOUNTER — Ambulatory Visit
Admission: RE | Admit: 2019-06-17 | Discharge: 2019-06-17 | Disposition: A | Discharge status: Home / Self Care | Payer: Medicare Other | Source: Ambulatory Visit | Attending: Radiation Oncology | Admitting: Radiation Oncology

## 2019-06-17 ENCOUNTER — Telehealth: Payer: Self-pay | Admitting: *Deleted

## 2019-06-17 NOTE — Telephone Encounter (Signed)
CALLED PATIENT TO RESCHEDULE MISSED FU APPT. FOR 06-17-19, LVM FOR A RETURN CALL

## 2019-06-18 ENCOUNTER — Encounter: Payer: Self-pay | Admitting: Cardiology

## 2019-06-18 ENCOUNTER — Other Ambulatory Visit: Payer: Self-pay

## 2019-06-18 ENCOUNTER — Ambulatory Visit (INDEPENDENT_AMBULATORY_CARE_PROVIDER_SITE_OTHER): Payer: Medicare Other | Admitting: Cardiology

## 2019-06-18 VITALS — BP 116/78 | HR 75 | Ht 66.0 in | Wt 216.0 lb

## 2019-06-18 DIAGNOSIS — E782 Mixed hyperlipidemia: Secondary | ICD-10-CM

## 2019-06-18 DIAGNOSIS — I10 Essential (primary) hypertension: Secondary | ICD-10-CM | POA: Diagnosis not present

## 2019-06-18 DIAGNOSIS — I2510 Atherosclerotic heart disease of native coronary artery without angina pectoris: Secondary | ICD-10-CM | POA: Diagnosis not present

## 2019-06-18 NOTE — Progress Notes (Signed)
Cardiology Office Note:    Date:  06/18/2019   ID:  Allison Spencer, DOB 1960-11-09, MRN [id number]  PCP:  Allison Riches, NP  Cardiologist:  Allison Salines, DO  Electrophysiologist:  None   Referring MD: Allison Riches, NP   " I am doing well."  History of Present Illness:    Allison Spencer is a 59 y.o. female with a hx of hyperlipidemia,mild coronary artery disease from a cardiac catheterization performed in 2015 per patient,OSAstage IIIC1 grade 1 endometrioid endometrial cancer diagnosed in January, 2018which was treated with cancer fighting food - no chemotherapy.   Last saw patient on September 27, 2018 at which time she was experiencing intermittent chest pain.  During in her visit we did order a transthoracic echocardiogram and a pharmacologic stress test.  At her last visit she told me that her oncologist reported her cancer may have recurred it seems that there may be some metastasis.  She was waiting for results from the tumor board for treatment plan.  Today she is happy to tell me that has had some treatment with [place] and was told that she is back in remission. She is still getting therapy.    Past Medical History:  Diagnosis Date  . Acquired hypothyroidism 04/26/2019  . Arthritis   . Back pain 02/03/2017  . Complication of anesthesia 01/10/2016   when [place] feels like lump in throat had endo for bur feeling continues  . Coronary artery disease involving native coronary artery of native heart without angina pectoris 09/08/2014  . DDD (degenerative disc disease), cervical 03/03/2014  . Depression   . Diverticulitis   . Elevated liver enzymes 04/05/2019  . Endometrial cancer ([place])   . Essential hypertension   . 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 Family history of brain cancer   . Family history of breast cancer   . Family history of colon cancer   . Genetic testing 05/01/2016   Negative genetic testing on the TumorNext  [place] with CancerNext.  This is paired germline and tumor analyses for enhanced diagnosis of lynch syndrome plus analyses of 29 additional genes associated with hereditary cancer.  The CancerNext gene panel offered by [employer] includes sequencing and rearrangement analysis for the following 34 genes:   APC, ATM, BARD1, BMPR1A, BRCA1, BRCA2, BRIP1, CD  . GERD (gastroesophageal reflux disease)   . Goals of care, counseling/discussion 03/02/2019  . H. pylori infection   . Headache    migraines  . Heart palpitations   . History of hiatal hernia   . Hyperlipidemia   . IBS (irritable bowel syndrome)   . LTBI (latent tuberculosis infection) 12/06/2015  . Lumbar spondylosis 11/05/2017  . OSA (obstructive sleep apnea)    not used  last 3 weeks due to cold cpap setting of 11  . 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 Sinusitis, acute 05/25/2019  . 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 Urinary incontinence     Past Surgical History:  Procedure Laterality Date  . CHOLECYSTECTOMY  2000  . COLECTOMY  2016  . colonscopy    . ROBOTIC ASSISTED TOTAL HYSTERECTOMY WITH BILATERAL SALPINGO OOPHERECTOMY Bilateral 01/18/2016   Procedure: XI ROBOTIC ASSISTED TOTAL HYSTERECTOMY WITH BILATERAL SALPINGO OOPHORECTOMY WITH SENTINAL LYMPH NODE BIOPSY; LYSIS OF ADHESIONS;  Surgeon: Allison Amber, MD;  Location: [facility];  Service: Gynecology;  Laterality: Bilateral;  .  TUBAL LIGATION  1991  . UPPER GI ENDOSCOPY  01/10/2016    Current Medications: No outpatient medications have been marked as taking for the 06/18/19 encounter (Office Visit) with Allison Ripple, DO.     Allergies:   Codeine, Other, and Sulfa antibiotics   Social History   Socioeconomic History  . Marital status: Married    Spouse name: Allison Spencer  . Number of children: 3  . Years of education: Not on file  . Highest education level: Not on file    Occupational History  . Occupation: Non-working, applying disability  Tobacco Use  . Smoking status: Former Smoker    Packs/day: 1.00    Years: 10.00    Pack years: 10.00    Types: Cigarettes    Quit date: 12/05/1988    Years since quitting: 30.5  . Smokeless tobacco: Never Used  Vaping Use  . Vaping Use: Never used  Substance and Sexual Activity  . Alcohol use: No  . Drug use: No  . Sexual activity: Yes    Partners: Male  Other Topics Concern  . Not on file  Social History Narrative   Taking care of her grandson who has autism, 69 years old   Social Determinants of Health   Financial Resource Strain:   . Difficulty of Paying Living Expenses:   Food Insecurity:   . Worried About Programme researcher, broadcasting/film/video in the Last Year:   . Barista in the Last Year:   Transportation Needs:   . Freight forwarder (Medical):   Marland Kitchen Lack of Transportation (Non-Medical):   Physical Activity:   . Days of Exercise per Week:   . Minutes of Exercise per Session:   Stress:   . Feeling of Stress :   Social Connections:   . Frequency of Communication with Friends and Family:   . Frequency of Social Gatherings with Friends and Family:   . Attends Religious Services:   . Active Member of Clubs or Organizations:   . Attends Banker Meetings:   Marland Kitchen Marital Status:      Family History: The patient's family history includes Brain cancer in her cousin; CAD in her father; COPD in her brother and mother; Cancer in her maternal aunt and maternal uncle; Cancer (age of onset: 58) in her maternal grandmother; Colon cancer in her maternal uncle; Heart attack in her brother; Pulmonary embolism in her sister.  ROS:   Review of Systems  Constitution: Negative for decreased appetite, fever and weight gain.  HENT: Negative for congestion, ear discharge, hoarse voice and sore throat.   Eyes: Negative for discharge, redness, vision loss in right eye and visual halos.  Cardiovascular: Negative  for chest pain, dyspnea on exertion, leg swelling, orthopnea and palpitations.  Respiratory: Negative for cough, hemoptysis, shortness of breath and snoring.   Endocrine: Negative for heat intolerance and polyphagia.  Hematologic/Lymphatic: Negative for bleeding problem. Does not bruise/bleed easily.  Skin: Negative for flushing, nail changes, rash and suspicious lesions.  Musculoskeletal: Negative for arthritis, joint pain, muscle cramps, myalgias, neck pain and stiffness.  Gastrointestinal: Negative for abdominal pain, bowel incontinence, diarrhea and excessive appetite.  Genitourinary: Negative for decreased libido, genital sores and incomplete emptying.  Neurological: Negative for brief paralysis, focal weakness, headaches and loss of balance.  Psychiatric/Behavioral: Negative for altered mental status, depression and suicidal ideas.  Allergic/Immunologic: Negative for HIV exposure and persistent infections.    EKGs/Labs/Other Studies Reviewed:    The following studies were reviewed today:  EKG:  The ekg ordered today demonstrates NSR hr 78 bpm  Recent Labs: 10/06/2018: Magnesium 2.1 06/14/2019: ALT 19; BUN 11; Creatinine 0.93; Hemoglobin 13.4; Platelet Count 217; Potassium 4.5; Sodium 141; TSH 63.789  Recent Lipid Panel No results found for: CHOL, TRIG, HDL, CHOLHDL, VLDL, LDLCALC, LDLDIRECT  Physical Exam:    VS:  BP 116/78   Pulse 75   Ht 5\' 6"  (1.676 m)   Wt 216 lb (98 kg)   SpO2 98%   BMI 34.86 kg/m     Wt Readings from Last 3 Encounters:  06/18/19 216 lb (98 kg)  06/15/19 215 lb 9.6 oz (97.8 kg)  05/25/19 209 lb 9.6 oz (95.1 kg)     GEN: Well nourished, well developed in no acute distress HEENT: Normal NECK: No JVD; No carotid bruits LYMPHATICS: No lymphadenopathy CARDIAC: S1S2 noted,RRR, no murmurs, rubs, gallops RESPIRATORY:  Clear to auscultation without rales, wheezing or rhonchi  ABDOMEN: Soft, non-tender, non-distended, +bowel sounds, no  guarding. EXTREMITIES: No edema, No cyanosis, no clubbing MUSCULOSKELETAL:  No deformity  SKIN: Warm and dry NEUROLOGIC:  Alert and oriented x 3, non-focal PSYCHIATRIC:  Normal affect, good insight  ASSESSMENT:    1. Essential hypertension   2. Mild CAD   3. Mixed hyperlipidemia    PLAN:     1. Her blood pressure is acceptable today.  I will continue here current medication regimen.  2. Mild CAD - no anginal symptoms.   3. Hyperlipidemia- continue on crestor 40 mg daily.  The patient is in agreement with the above plan. The patient left the office in stable condition.  The patient will follow up in 1 year or sooner if needed.   Medication Adjustments/Labs and Tests Ordered: Current medicines are reviewed at length with the patient today.  Concerns regarding medicines are outlined above.  Orders Placed This Encounter  Procedures  . EKG 12-Lead   No orders of the defined types were placed in this encounter.   Patient Instructions  Medication Instructions:   Your physician recommends that you continue on your current medications as directed. Please refer to the Current Medication list given to you today.  *If you need a refill on your cardiac medications before your next appointment, please call your pharmacy*   Lab Work: None ordered   If you have labs (blood work) drawn today and your tests are completely normal, you will receive your results only by: 05/27/19 MyChart Message (if you have MyChart) OR . A paper copy in the mail If you have any lab test that is abnormal or we need to change your treatment, we will call you to review the results.   Testing/Procedures: None ordered    Follow-Up: At [facility], you and your health needs are our priority.  As part of our continuing mission to provide you with exceptional heart care, we have created designated Provider Care Teams.  These Care Teams include your primary Cardiologist (physician) and Advanced Practice  Providers (APPs -  Physician Assistants and Nurse Practitioners) who all work together to provide you with the care you need, when you need it.  We recommend signing up for the patient portal called "MyChart".  Sign up information is provided on this After Visit Summary.  MyChart is used to connect with patients for Virtual Visits (Telemedicine).  Patients are able to view lab/test results, encounter notes, upcoming appointments, etc.  Non-urgent messages can be sent to your provider as well.   To learn more about what  you can do with MyChart, go to [email or web address].    Your next appointment:   12 month(s)  The format for your next appointment:   In Person  Provider:   Berniece Salines, DO   Other Instructions None      Adopting a Healthy Lifestyle.  Know what a healthy weight is for you (roughly BMI <25) and aim to maintain this   Aim for 7+ servings of fruits and vegetables daily   65-80+ fluid ounces of water or unsweet tea for healthy kidneys   Limit to max 1 drink of alcohol per day; avoid smoking/tobacco   Limit animal fats in diet for cholesterol and heart health - choose grass fed whenever available   Avoid highly processed foods, and foods high in saturated/trans fats   Aim for low stress - take time to unwind and care for your mental health   Aim for 150 min of moderate intensity exercise weekly for heart health, and weights twice weekly for bone health   Aim for 7-9 hours of sleep daily   When it comes to diets, agreement about the perfect plan isnt easy to find, even among the experts. Experts at the [employer] developed an idea known as the Healthy Eating Plate. Just imagine a plate divided into logical, healthy portions.   The emphasis is on diet quality:   Load up on vegetables and fruits - one-half of your plate: Aim for color and variety, and remember that potatoes dont count.   Go for whole grains - one-quarter of your plate:  Whole wheat, barley, wheat berries, quinoa, oats, brown rice, and foods made with them. If you want pasta, go with whole wheat pasta.   Protein power - one-quarter of your plate: Fish, chicken, beans, and nuts are all healthy, versatile protein sources. Limit red meat.   The diet, however, does go beyond the plate, offering a few other suggestions.   Use healthy plant oils, such as olive, canola, soy, corn, sunflower and peanut. Check the labels, and avoid partially hydrogenated oil, which have unhealthy trans fats.   If youre thirsty, drink water. Coffee and tea are good in moderation, but skip sugary drinks and limit milk and dairy products to one or two daily servings.   The type of carbohydrate in the diet is more important than the amount. Some sources of carbohydrates, such as vegetables, fruits, whole grains, and beans-are healthier than others.   Finally, stay active  Signed, Allison Salines, DO  06/18/2019 8:09 PM    [facility]

## 2019-06-18 NOTE — Patient Instructions (Signed)
Medication Instructions:  Your physician recommends that you continue on your current medications as directed. Please refer to the Current Medication list given to you today.  *If you need a refill on your cardiac medications before your next appointment, please call your pharmacy*   Lab Work: None ordered   If you have labs (blood work) drawn today and your tests are completely normal, you will receive your results only by: . MyChart Message (if you have MyChart) OR . A paper copy in the mail If you have any lab test that is abnormal or we need to change your treatment, we will call you to review the results.   Testing/Procedures: None ordered    Follow-Up: At [facility], you and your health needs are our priority.  As part of our continuing mission to provide you with exceptional heart care, we have created designated Provider Care Teams.  These Care Teams include your primary Cardiologist (physician) and Advanced Practice Providers (APPs -  Physician Assistants and Nurse Practitioners) who all work together to provide you with the care you need, when you need it.  We recommend signing up for the patient portal called "MyChart".  Sign up information is provided on this After Visit Summary.  MyChart is used to connect with patients for Virtual Visits (Telemedicine).  Patients are able to view lab/test results, encounter notes, upcoming appointments, etc.  Non-urgent messages can be sent to your provider as well.   To learn more about what you can do with MyChart, go to [email or web address].    Your next appointment:   12 month(s)  The format for your next appointment:   In Person  Provider:   Kardie Tobb, DO   Other Instructions None   

## 2019-06-21 ENCOUNTER — Other Ambulatory Visit: Payer: Self-pay | Admitting: Hematology and Oncology

## 2019-06-21 ENCOUNTER — Telehealth: Payer: Self-pay

## 2019-06-21 NOTE — Telephone Encounter (Signed)
I will change the dose If she ok to come back on 6/30?

## 2019-06-21 NOTE — Telephone Encounter (Signed)
Called back and given below message. She verbalized understanding. She would like to continue treatment every 6 weeks.

## 2019-06-21 NOTE — Telephone Encounter (Signed)
Called and left a message asking her to call the office back. 

## 2019-06-21 NOTE — Telephone Encounter (Signed)
Called and given below message. She can come back on 6/30.

## 2019-06-21 NOTE — Telephone Encounter (Signed)
-----   Message from Heath Lark, MD sent at 06/21/2019  8:28 AM EDT ----- Regarding: she is supposed to call me back about her decision what to do with Rx. can you call her back?

## 2019-06-23 ENCOUNTER — Telehealth: Payer: Self-pay | Admitting: Hematology and Oncology

## 2019-06-23 NOTE — Telephone Encounter (Signed)
Scheduled appt per 6/14 sch message - unable to reach pt .left message with appt date and time ° °

## 2019-07-07 ENCOUNTER — Inpatient Hospital Stay: Payer: Medicare Other

## 2019-07-07 ENCOUNTER — Inpatient Hospital Stay: Payer: Medicare Other | Admitting: Hematology and Oncology

## 2019-07-07 ENCOUNTER — Telehealth: Payer: Self-pay | Admitting: Hematology and Oncology

## 2019-07-07 NOTE — Telephone Encounter (Signed)
R/s appt per 6/30 sch message - unable to reach pt . Left message with apt date and time

## 2019-07-14 NOTE — Progress Notes (Signed)
Pharmacist Chemotherapy Monitoring - Follow Up Assessment    I verify that I have reviewed each item in the below checklist:   Regimen for the patient is scheduled for the appropriate day and plan matches scheduled date.  Appropriate non-routine labs are ordered dependent on drug ordered.  If applicable, additional medications reviewed and ordered per protocol based on lifetime cumulative doses and/or treatment regimen.   Plan for follow-up and/or issues identified: Yes  I-vent associated with next due treatment: Yes  MD and/or nursing notified: No   Kennith Center, Pharm.D., CPP 07/14/2019@12 :52 PM

## 2019-07-20 ENCOUNTER — Inpatient Hospital Stay: Payer: Medicare Other | Attending: Gynecologic Oncology

## 2019-07-20 ENCOUNTER — Other Ambulatory Visit: Payer: Self-pay

## 2019-07-20 ENCOUNTER — Encounter: Payer: Self-pay | Admitting: Hematology and Oncology

## 2019-07-20 ENCOUNTER — Telehealth: Payer: Self-pay

## 2019-07-20 ENCOUNTER — Inpatient Hospital Stay (HOSPITAL_BASED_OUTPATIENT_CLINIC_OR_DEPARTMENT_OTHER): Payer: Medicare Other | Admitting: Hematology and Oncology

## 2019-07-20 ENCOUNTER — Inpatient Hospital Stay: Payer: Medicare Other

## 2019-07-20 DIAGNOSIS — R062 Wheezing: Secondary | ICD-10-CM | POA: Insufficient documentation

## 2019-07-20 DIAGNOSIS — Z5112 Encounter for antineoplastic immunotherapy: Secondary | ICD-10-CM | POA: Diagnosis present

## 2019-07-20 DIAGNOSIS — Z7189 Other specified counseling: Secondary | ICD-10-CM

## 2019-07-20 DIAGNOSIS — J019 Acute sinusitis, unspecified: Secondary | ICD-10-CM

## 2019-07-20 DIAGNOSIS — C541 Malignant neoplasm of endometrium: Secondary | ICD-10-CM

## 2019-07-20 DIAGNOSIS — Z79899 Other long term (current) drug therapy: Secondary | ICD-10-CM | POA: Insufficient documentation

## 2019-07-20 LAB — CBC WITH DIFFERENTIAL (CANCER CENTER ONLY)
Abs Immature Granulocytes: 0.04 10*3/uL (ref 0.00–0.07)
Basophils Absolute: 0.1 10*3/uL (ref 0.0–0.1)
Basophils Relative: 1 %
Eosinophils Absolute: 0.4 10*3/uL (ref 0.0–0.5)
Eosinophils Relative: 4 %
HCT: 38.9 % (ref 36.0–46.0)
Hemoglobin: 12.8 g/dL (ref 12.0–15.0)
Immature Granulocytes: 0 %
Lymphocytes Relative: 19 %
Lymphs Abs: 1.8 10*3/uL (ref 0.7–4.0)
MCH: 29.7 pg (ref 26.0–34.0)
MCHC: 32.9 g/dL (ref 30.0–36.0)
MCV: 90.3 fL (ref 80.0–100.0)
Monocytes Absolute: 0.6 10*3/uL (ref 0.1–1.0)
Monocytes Relative: 6 %
Neutro Abs: 6.8 10*3/uL (ref 1.7–7.7)
Neutrophils Relative %: 70 %
Platelet Count: 226 10*3/uL (ref 150–400)
RBC: 4.31 MIL/uL (ref 3.87–5.11)
RDW: 14 % (ref 11.5–15.5)
WBC Count: 9.8 10*3/uL (ref 4.0–10.5)
nRBC: 0 % (ref 0.0–0.2)

## 2019-07-20 LAB — CMP (CANCER CENTER ONLY)
ALT: 15 U/L (ref 0–44)
AST: 15 U/L (ref 15–41)
Albumin: 3.6 g/dL (ref 3.5–5.0)
Alkaline Phosphatase: 115 U/L (ref 38–126)
Anion gap: 9 (ref 5–15)
BUN: 16 mg/dL (ref 6–20)
CO2: 22 mmol/L (ref 22–32)
Calcium: 9.1 mg/dL (ref 8.9–10.3)
Chloride: 109 mmol/L (ref 98–111)
Creatinine: 0.95 mg/dL (ref 0.44–1.00)
GFR, Est AFR Am: >60 mL/min (ref >60)
GFR, Estimated: >60 mL/min (ref >60)
Glucose, Bld: 99 mg/dL (ref 70–99)
Potassium: 4.3 mmol/L (ref 3.5–5.1)
Sodium: 140 mmol/L (ref 135–145)
Total Bilirubin: 0.5 mg/dL (ref 0.3–1.2)
Total Protein: 7 g/dL (ref 6.5–8.1)

## 2019-07-20 LAB — TSH: TSH: 37.058 u[IU]/mL — ABNORMAL HIGH (ref 0.308–3.960)

## 2019-07-20 MED ORDER — SODIUM CHLORIDE 0.9 % IV SOLN
400.0000 mg | Freq: Once | INTRAVENOUS | Status: AC
  Administered 2019-07-20: 400 mg via INTRAVENOUS
  Filled 2019-07-20: qty 16

## 2019-07-20 MED ORDER — SODIUM CHLORIDE 0.9 % IV SOLN
Freq: Once | INTRAVENOUS | Status: AC
  Filled 2019-07-20: qty 250

## 2019-07-20 NOTE — Assessment & Plan Note (Signed)
She has recurrent sinus congestion despite antibiotics and steroid therapy I recommend ENT consult She has seen ear nose and throat before and I recommend her to contact them for an appointment

## 2019-07-20 NOTE — Assessment & Plan Note (Signed)
Her recent imaging study showed complete response to treatment She tolerated immunotherapy well except for side effects with acquired hypothyroidism The plan will be to continue treatment indefinitely, 400 mg dose every 6 weeks

## 2019-07-20 NOTE — Progress Notes (Signed)
[place] OFFICE PROGRESS NOTE  Patient Care Team: Imagene Riches, NP as PCP - General Berniece Salines, DO as PCP - Cardiology (Cardiology) Gavin Pound, MD as Consulting Physician (Rheumatology) Awanda Mink Craige Cotta, RN as Oncology Nurse Navigator (Oncology)  ASSESSMENT & PLAN:  Endometrial cancer [facility]) Her recent imaging study showed complete response to treatment She tolerated immunotherapy well except for side effects with acquired hypothyroidism The plan will be to continue treatment indefinitely, 400 mg dose every 6 weeks  Sinusitis, acute She has recurrent sinus congestion despite antibiotics and steroid therapy I recommend ENT consult She has seen ear nose and throat before and I recommend her to contact them for an appointment  Expiratory wheezing She has bilateral expiratory wheezes She takes nebulizer treatment I recommend follow-up with her pulmonologist for management Her blood work did not show any signs of leukocytosis and she is afebrile I do not believe this is related to her treatment   No orders of the defined types were placed in this encounter.   All questions were answered. The patient knows to call the clinic with any problems, questions or concerns. The total time spent in the appointment was 20 minutes encounter with patients including review of chart and various tests results, discussions about plan of care and coordination of care plan   Heath Lark, MD 07/20/2019 10:27 AM  INTERVAL HISTORY: Please see below for problem oriented charting. She returns for further follow-up and treatment She complained of some nasal congestion and cough No fever or chills She has been having this persistent nasal drainage despite antibiotics and prednisone therapy 2 months ago She has seen pulmonologist and ENT locally Otherwise, no other side effects of treatment so far  SUMMARY OF ONCOLOGIC HISTORY: Oncology History Overview Note  MSI high disease  detected   Endometrial cancer ([place])  07/05/2015 Pathology Results   She had abnormal PAP   11/24/2015 Procedure   She underwent endometrial sampling that came back abnormal   01/10/2016 Procedure   She had EGD which showed esophagitis   01/18/2016 Pathology Results   Diagnosis 1. Lymph node, sentinel, biopsy, right obturator - MICROMETASTASIS IN ONE OF ONE LYMPH NODES (1/1). 2. Lymph node, sentinel, biopsy, left external - ONE OF ONE LYMPH NODES NEGATIVE FOR CARCINOMA (0/1). 3. Lymph node, sentinel, biopsy, left obturator - MICROMETASTASIS IN ONE OF ONE LYMPH NODES (1/1). 4. Uterus +/- tubes/ovaries, neoplastic - UTERUS: -ENDOMYOMETRIUM: ENDOMETRIOID ADENOCARCINOMA, FIGO GRADE 1, SPANNING 3.3 CM. TUMOR INVADES LESS THAN 1/2 OF THE MYOMETRIUM. SEE ONCOLOGY TABLE. -SEROSA: UNREMARKABLE. NO MALIGNANCY IDENTIFIED. - CERVIX: BENIGN SQUAMOUS AND ENDOCERVICAL MUCOSA. NO DYSPLASIA OR MALIGNANCY. - BILATERAL OVARIES: INCLUSION CYSTS. NO MALIGNANCY. - BILATERAL FALLOPIAN TUBES: UNREMARKABLE RIGHT TUBE. LEFT TUBE IS NOT IDENTIFIED GROSSLY OR MICROSCOPICALLY.   01/18/2016 Surgery   She underwent robotic-assisted laparoscopic total hysterectomy with bilateral salpingoophorectomy, sentinel lymph node biopsy, lysis of adhesions   04/24/2016 Genetic Testing   Negative genetic testing on the TumorNext Lynch with CancerNext.  This is paired germline and tumor analyses for enhanced diagnosis of lynch syndrome plus analyses of 29 additional genes associated with hereditary cancer.  The CancerNext gene panel offered by [employer] includes sequencing and rearrangement analysis for the following 34 genes:   APC, ATM, BARD1, BMPR1A, BRCA1, BRCA2, BRIP1, CDH1, CDK4, CDKN2A, CHEK2, DICER, HOXB13, EPCAM, GREM1, MLH1, MRE11A, MSH2, MSH6, MUTYH, NBN, NF1, PALB2, PMS2, POLD1, POLE, PTEN, RAD50, RAD51C, RAD51D, SMAD4, SMARCA4, STK11, and TP53.   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  Taken with the germline results demonstrating absence of pathogenic mutations or likely pathogenic variants (VLPs) in the mismatch repair (MMR) genes, the likelihood that this individual has Lynch syndrome/HNPCC is greatly decreased; however, the possibility of an undetected variant of either germline or somatic origin due to mosaicism and other rare etiologies cannot be ruled out by the current methodology.  Correlation with clinical history and external tumor testing results is advised.    10/29/2018 PET scan   Outside PET scan showed enlarged aorta-caval LN   12/17/2018 - 01/15/2019 Radiation Therapy   Radiation Treatment Dates: 12/17/2018 through 01/15/2019 Site Technique Total Dose (Gy) Dose per Fx (Gy) Completed Fx Beam Energies  Abdomen: Abd_PA node 3D 50/50 2.5 20/20 6X, 15X        03/01/2019 Imaging   1. No CT findings for metastatic disease involving the chest, abdomen or pelvis. 2. Stable moderate-sized hiatal hernia. 3. Status post cholecystectomy. No biliary dilatation. 4. Stable hepatic cysts.   03/15/2019 -  Chemotherapy   The patient had pembrolizumab (KEYTRUDA) 200 mg in sodium chloride 0.9 % 50 mL chemo infusion, 200 mg, Intravenous, Once, 5 of 7 cycles Dose modification: 400 mg (original dose 200 mg, Cycle 6, Reason: Other (see comments)) Administration: 200 mg (03/15/2019), 200 mg (04/26/2019), 200 mg (05/25/2019), 200 mg (04/05/2019), 200 mg (06/15/2019)  for chemotherapy treatment.    06/14/2019 Imaging   1. No acute findings within the abdomen or pelvis. No findings of recurrent tumor or metastatic disease. 2. Hiatal hernia     REVIEW OF SYSTEMS:   Constitutional: Denies fevers, chills or abnormal weight loss Eyes: Denies blurriness of vision Ears, nose, mouth, throat, and face: Denies mucositis or sore throat Cardiovascular: Denies palpitation, chest discomfort or lower extremity swelling Gastrointestinal:  Denies nausea, heartburn or change  in bowel habits Skin: Denies abnormal skin rashes Lymphatics: Denies new lymphadenopathy or easy bruising Neurological:Denies numbness, tingling or new weaknesses Behavioral/Psych: Mood is stable, no new changes  All other systems were reviewed with the patient and are negative.  I have reviewed the past medical history, past surgical history, social history and family history with the patient and they are unchanged from previous note.  ALLERGIES:  is allergic to codeine, other, and sulfa antibiotics.  MEDICATIONS:  Current Outpatient Medications  Medication Sig Dispense Refill  . albuterol (PROVENTIL) (2.5 MG/3ML) 0.083% nebulizer solution     . B Complex Vitamins (VITAMIN-B COMPLEX) TABS Take 1 tablet by mouth daily.     Marland Kitchen dicyclomine (BENTYL) 10 MG capsule Take 10 mg by mouth 2 (two) times daily as needed for spasms.     Marland Kitchen escitalopram (LEXAPRO) 20 MG tablet TAKE ONE AND ONE HALF (1.5) TABLETS BY MOUTH EVERY MORNING  1  . famotidine (PEPCID) 40 MG tablet TAKE 1 (ONE) TABLET TWICE DAILY, WITH BREAKFAST AND AT BEDTIME    . fluticasone (FLONASE) 50 MCG/ACT nasal spray INSTILL 2 SPRAYS INTO EACH NOSTRIL ONCE A DAY  0  . ketoconazole (NIZORAL) 2 % shampoo ketoconazole 2 % shampoo  APPLY TO WASH TWICE WEEKLY    . levothyroxine (SYNTHROID) 75 MCG tablet Take 1 tablet (75 mcg total) by mouth daily before breakfast. 30 tablet 11  . losartan (COZAAR) 25 MG tablet Take 25 mg by mouth daily.  0  . nystatin cream (MYCOSTATIN)     . ondansetron (ZOFRAN) 8 MG tablet Take 1 tablet (8 mg total) by mouth 2 (two) times daily as needed (  Nausea or vomiting). 30 tablet 1  . pantoprazole (PROTONIX) 40 MG tablet Take 40 mg by mouth 2 (two) times daily.   11  . pravastatin (PRAVACHOL) 20 MG tablet TAKE 1 TABLET BY MOUTH DAILY    . pregabalin (LYRICA) 150 MG capsule Take 150 mg by mouth 2 (two) times daily.    . prochlorperazine (COMPAZINE) 10 MG tablet Take 1 tablet (10 mg total) by mouth every 6 (six) hours  as needed for nausea or vomiting. 30 tablet 0  . prochlorperazine (COMPAZINE) 10 MG tablet Take 1 tablet (10 mg total) by mouth every 6 (six) hours as needed (Nausea or vomiting). 30 tablet 1  . QUEtiapine (SEROQUEL) 50 MG tablet Take 50 mg by mouth daily.    . rosuvastatin (CRESTOR) 40 MG tablet Take 40 mg by mouth daily.    . solifenacin (VESICARE) 10 MG tablet Vesicare 10 mg tablet  TAKE 1 TABLET BY MOUTH EVERY DAY     No current facility-administered medications for this visit.    PHYSICAL EXAMINATION: ECOG PERFORMANCE STATUS: 1 - Symptomatic but completely ambulatory  Vitals:   07/20/19 1006  BP: 133/78  Pulse: 64  Resp: 18  Temp: 98.3 F (36.8 C)  SpO2: 97%   Filed Weights   07/20/19 1006  Weight: 218 lb 12.8 oz (99.2 kg)    GENERAL:alert, no distress and comfortable SKIN: skin color, texture, turgor are normal, no rashes or significant lesions EYES: normal, Conjunctiva are pink and non-injected, sclera clear OROPHARYNX:no exudate, no erythema and lips, buccal mucosa, and tongue normal  NECK: supple, thyroid normal size, non-tender, without nodularity LYMPH:  no palpable lymphadenopathy in the cervical, axillary or inguinal LUNGS: Normal breathing effort with bilateral expiratory wheezes HEART: regular rate & rhythm and no murmurs and no lower extremity edema ABDOMEN:abdomen soft, non-tender and normal bowel sounds Musculoskeletal:no cyanosis of digits and no clubbing  NEURO: alert & oriented x 3 with fluent speech, no focal motor/sensory deficits  LABORATORY DATA:  I have reviewed the data as listed    Component Value Date/Time   NA 140 07/20/2019 0948   NA 142 10/06/2018 1101   K 4.3 07/20/2019 0948   CL 109 07/20/2019 0948   CO2 22 07/20/2019 0948   GLUCOSE 99 07/20/2019 0948   BUN 16 07/20/2019 0948   BUN 14 10/06/2018 1101   CREATININE 0.95 07/20/2019 0948   CREATININE 0.87 12/06/2015 1022   CALCIUM 9.1 07/20/2019 0948   PROT 7.0 07/20/2019 0948    ALBUMIN 3.6 07/20/2019 0948   AST 15 07/20/2019 0948   ALT 15 07/20/2019 0948   ALKPHOS 115 07/20/2019 0948   BILITOT 0.5 07/20/2019 0948   GFRNONAA >60 07/20/2019 0948   GFRAA >60 07/20/2019 0948    No results found for: SPEP, UPEP  Lab Results  Component Value Date   WBC 9.8 07/20/2019   NEUTROABS 6.8 07/20/2019   HGB 12.8 07/20/2019   HCT 38.9 07/20/2019   MCV 90.3 07/20/2019   PLT 226 07/20/2019      Chemistry      Component Value Date/Time   NA 140 07/20/2019 0948   NA 142 10/06/2018 1101   K 4.3 07/20/2019 0948   CL 109 07/20/2019 0948   CO2 22 07/20/2019 0948   BUN 16 07/20/2019 0948   BUN 14 10/06/2018 1101   CREATININE 0.95 07/20/2019 0948   CREATININE 0.87 12/06/2015 1022      Component Value Date/Time   CALCIUM 9.1 07/20/2019 0948   ALKPHOS  115 07/20/2019 0948   AST 15 07/20/2019 0948   ALT 15 07/20/2019 0948   BILITOT 0.5 07/20/2019 2585

## 2019-07-20 NOTE — Assessment & Plan Note (Signed)
She has bilateral expiratory wheezes She takes nebulizer treatment I recommend follow-up with her pulmonologist for management Her blood work did not show any signs of leukocytosis and she is afebrile I do not believe this is related to her treatment

## 2019-07-20 NOTE — Telephone Encounter (Signed)
Per Dr Alvy Bimler relayed TSH results are better and to continue current dose of synthroid. Pt verbalized understanding. No further problems or concerns noted.

## 2019-07-20 NOTE — Patient Instructions (Signed)
[facility] Discharge Instructions for Patients Receiving Chemotherapy  Today you received the following chemotherapy agents :  Keytruda.  To help prevent nausea and vomiting after your treatment, we encourage you to take your nausea medication as prescribed.   If you develop nausea and vomiting that is not controlled by your nausea medication, call the clinic.   BELOW ARE SYMPTOMS THAT SHOULD BE REPORTED IMMEDIATELY:  *FEVER GREATER THAN 100.5 F  *CHILLS WITH OR WITHOUT FEVER  NAUSEA AND VOMITING THAT IS NOT CONTROLLED WITH YOUR NAUSEA MEDICATION  *UNUSUAL SHORTNESS OF BREATH  *UNUSUAL BRUISING OR BLEEDING  TENDERNESS IN MOUTH AND THROAT WITH OR WITHOUT PRESENCE OF ULCERS  *URINARY PROBLEMS  *BOWEL PROBLEMS  UNUSUAL RASH Items with * indicate a potential emergency and should be followed up as soon as possible.  Feel free to call the clinic should you have any questions or concerns. The clinic phone number is [phone].  Please show the CHEMO ALERT CARD at check-in to the Emergency Department and triage nurse.  

## 2019-08-18 ENCOUNTER — Other Ambulatory Visit: Payer: Self-pay | Admitting: Hematology and Oncology

## 2019-08-30 ENCOUNTER — Telehealth: Payer: Self-pay

## 2019-08-30 NOTE — Telephone Encounter (Signed)
Returned call patient left earlier today.  Patient stated she has been sick for 2+ weeks with productive cough and congestion.  Has seen PCP Dr. Allean Found, chest xray +pneumonia, has completed course of levaquin and erythromycin.  Patient was calling to cancel appointments. Patient stated she will call back to reschedule when she feels better.

## 2019-08-31 ENCOUNTER — Inpatient Hospital Stay: Payer: Medicare Other | Admitting: Hematology and Oncology

## 2019-08-31 ENCOUNTER — Inpatient Hospital Stay: Payer: Medicare Other

## 2019-09-01 ENCOUNTER — Telehealth: Payer: Self-pay

## 2019-09-01 NOTE — Telephone Encounter (Signed)
RN returned call X 2 - Voicemail left for return call.

## 2019-09-02 ENCOUNTER — Telehealth: Payer: Self-pay | Admitting: Hematology and Oncology

## 2019-09-02 NOTE — Telephone Encounter (Signed)
Rescheduled appt per 8/25 sch msg - unable to reach pt .left message for patient with appt date and time

## 2019-09-03 ENCOUNTER — Other Ambulatory Visit: Payer: Medicare Other

## 2019-09-03 ENCOUNTER — Ambulatory Visit: Payer: Medicare Other | Admitting: Hematology and Oncology

## 2019-09-03 ENCOUNTER — Ambulatory Visit: Payer: Medicare Other

## 2019-09-14 ENCOUNTER — Other Ambulatory Visit: Payer: Medicare Other

## 2019-09-14 ENCOUNTER — Encounter: Payer: Self-pay | Admitting: Hematology and Oncology

## 2019-09-14 ENCOUNTER — Ambulatory Visit: Payer: Medicare Other | Admitting: Hematology and Oncology

## 2019-09-14 ENCOUNTER — Ambulatory Visit: Payer: Medicare Other

## 2019-09-29 ENCOUNTER — Telehealth: Payer: Self-pay | Admitting: Oncology

## 2019-09-29 NOTE — Telephone Encounter (Signed)
Left a message regarding treatment.  Requested a return call.

## 2019-09-30 NOTE — Telephone Encounter (Signed)
[facility] and asked if she is ready for treatments to resume.  She said she is but her car is being fixed and should be ready next week.  She will call us when she has her car back.

## 2019-11-08 ENCOUNTER — Telehealth: Payer: Self-pay | Admitting: Oncology

## 2019-11-08 NOTE — Telephone Encounter (Signed)
Called Allison Spencer to see if she is ready to resume treatments.  She said they are in quarantine until 11/12 because her little boy has tested positive for Covid.  Reviewed that she can follow up with Dr. Denman George or with her PCP if she doesn't want more treatments.  She said that she does want treatment and that she will call when she is ready to schedule.

## 2020-01-14 ENCOUNTER — Telehealth: Payer: Self-pay | Admitting: *Deleted

## 2020-01-14 NOTE — Telephone Encounter (Signed)
Monica from [facility] called to request last office per Dr Alcide Clever. Office note faxed

## 2020-01-19 ENCOUNTER — Telehealth: Payer: Self-pay | Admitting: *Deleted

## 2020-01-19 NOTE — Telephone Encounter (Signed)
Fax last office from 2020 to [facility] at Dr Alcide Clever office

## 2020-05-15 ENCOUNTER — Telehealth: Payer: Self-pay | Admitting: *Deleted

## 2020-05-15 NOTE — Telephone Encounter (Signed)
Patient called and scheduled a follow up appt for 5/23 with Dr Denman George

## 2020-05-26 ENCOUNTER — Encounter: Payer: Self-pay | Admitting: Gynecologic Oncology

## 2020-05-29 ENCOUNTER — Inpatient Hospital Stay: Payer: Medicare Other | Admitting: Gynecologic Oncology

## 2020-05-29 ENCOUNTER — Telehealth: Payer: Self-pay | Admitting: *Deleted

## 2020-05-29 DIAGNOSIS — C541 Malignant neoplasm of endometrium: Secondary | ICD-10-CM

## 2020-05-29 NOTE — Telephone Encounter (Signed)
Patient called and rescheduled her appt for 5/23 to 6/6, due to illness

## 2020-06-01 ENCOUNTER — Encounter: Payer: Self-pay | Admitting: Hematology and Oncology

## 2020-06-09 ENCOUNTER — Encounter: Payer: Self-pay | Admitting: Gynecologic Oncology

## 2020-06-12 ENCOUNTER — Inpatient Hospital Stay: Payer: Medicare Other | Attending: Gynecologic Oncology | Admitting: Gynecologic Oncology

## 2020-06-12 ENCOUNTER — Other Ambulatory Visit: Payer: Self-pay

## 2020-06-12 VITALS — BP 117/72 | HR 76 | Temp 97.3°F | Resp 18 | Ht 66.0 in | Wt 232.5 lb

## 2020-06-12 DIAGNOSIS — I2510 Atherosclerotic heart disease of native coronary artery without angina pectoris: Secondary | ICD-10-CM | POA: Diagnosis not present

## 2020-06-12 DIAGNOSIS — E039 Hypothyroidism, unspecified: Secondary | ICD-10-CM | POA: Diagnosis not present

## 2020-06-12 DIAGNOSIS — Z9221 Personal history of antineoplastic chemotherapy: Secondary | ICD-10-CM | POA: Diagnosis not present

## 2020-06-12 DIAGNOSIS — Z808 Family history of malignant neoplasm of other organs or systems: Secondary | ICD-10-CM | POA: Diagnosis not present

## 2020-06-12 DIAGNOSIS — F32A Depression, unspecified: Secondary | ICD-10-CM | POA: Insufficient documentation

## 2020-06-12 DIAGNOSIS — E785 Hyperlipidemia, unspecified: Secondary | ICD-10-CM | POA: Diagnosis not present

## 2020-06-12 DIAGNOSIS — Z90710 Acquired absence of both cervix and uterus: Secondary | ICD-10-CM | POA: Diagnosis not present

## 2020-06-12 DIAGNOSIS — I10 Essential (primary) hypertension: Secondary | ICD-10-CM | POA: Insufficient documentation

## 2020-06-12 DIAGNOSIS — Z923 Personal history of irradiation: Secondary | ICD-10-CM | POA: Insufficient documentation

## 2020-06-12 DIAGNOSIS — G4733 Obstructive sleep apnea (adult) (pediatric): Secondary | ICD-10-CM | POA: Diagnosis not present

## 2020-06-12 DIAGNOSIS — Z803 Family history of malignant neoplasm of breast: Secondary | ICD-10-CM | POA: Diagnosis not present

## 2020-06-12 DIAGNOSIS — Z87891 Personal history of nicotine dependence: Secondary | ICD-10-CM | POA: Insufficient documentation

## 2020-06-12 DIAGNOSIS — Z801 Family history of malignant neoplasm of trachea, bronchus and lung: Secondary | ICD-10-CM | POA: Insufficient documentation

## 2020-06-12 DIAGNOSIS — C541 Malignant neoplasm of endometrium: Secondary | ICD-10-CM

## 2020-06-12 DIAGNOSIS — Z90722 Acquired absence of ovaries, bilateral: Secondary | ICD-10-CM | POA: Diagnosis not present

## 2020-06-12 DIAGNOSIS — K219 Gastro-esophageal reflux disease without esophagitis: Secondary | ICD-10-CM | POA: Insufficient documentation

## 2020-06-12 DIAGNOSIS — Z8542 Personal history of malignant neoplasm of other parts of uterus: Secondary | ICD-10-CM | POA: Diagnosis not present

## 2020-06-12 DIAGNOSIS — Z79899 Other long term (current) drug therapy: Secondary | ICD-10-CM | POA: Diagnosis not present

## 2020-06-12 DIAGNOSIS — C772 Secondary and unspecified malignant neoplasm of intra-abdominal lymph nodes: Secondary | ICD-10-CM

## 2020-06-12 NOTE — Patient Instructions (Addendum)
We will work on getting your CT with Oval Linsey prior authorized with your insurance. We will then be able to schedule the CT. We will contact you with the results when available.  Follow up in three months with Dr. Denman George or sooner if needed.

## 2020-06-12 NOTE — Progress Notes (Signed)
Follow-up Note: Gyn-Onc  Consult was initially requested by Dr. Alfonse Spruce for the evaluation of Allison Spencer 60 y.o. female  CC:  Chief Complaint  Patient presents with  . recurrent endometrial cancer    Assessment/Plan:  Allison Spencer  is a 60 y.o.  year old with recurrent grade 1 endometrioid endometrial cancer diagnosed in January, 2018, recurrence diagnosed 10/06/18. 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  Completed therapy   S/p salvage therapy with pembrolizumab and radiation to para-aortic nodes.  Complete clinical response. Will re-evaluate with CT imaging (without contrast due to contrast inavailability) at [facility] (it has been a year since last imaging).  If this demonstrates progression, she will need to be re-evaluated for additional salvage chemotherapy. If this demonstrates no evidence of disease, she will continue surveillance examinations at a 3 monthly intervals until July, 2023.  HPI: Allison Spencer is a 60 year old G3P3 who is seen in consultation at the request of Dr Alfonse Spruce for grade 1 endometrial cancer. She was for a routine pap smear by her PCP, Heide Scales, FNP on 07/05/15. This revealed atypical glandular cells - endometrial. She was then seen by Dr Alfonse Spruce on 11/24/15 and reported a single episode of light pink spotting but otherwise no bleeding. 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 On 01/18/16 she underwent robotic assisted total hysterectomy, BSO, and SLN biopsy. Surgery was uncomplicated and there were no suspicious findings intraoperatively.  Final pathology revealed a stage IIIC1 endometrioid endometrial adenocarcinoma with a 3.3cm tumor invading <50% (1cm of 2.4cm) with no LVSI. However, bilateral obturator SLN's revealed  micrometastases on both H&E and IHC.  Postoperative imaging (including CT abdo/pelvis and chest x ray) revealed no enlarged nodes, residual disease or chest disease.  The patient was recommended to received 6 cycles of adjuvant carboplatin and paclitaxel in accordance with NCCN guidelines. She has met with medical oncology, Dr Heath Lark, but declined adjuvant chemotherapy as she was concerned about immunity. She elected for an "anti-cancer" diet.  She was tested for Lynch syndrome (her tumor is MSI positive with loss of MLH1 expression), however, Lynch syndrome testing was negative.   In 2019 she reported + radicular pain down left leg. She has a known disc prolapse and is seeing an orthopedic surgeon. Repeat CT scan on 05/26/17 showed no evidence of recurrence including no adenopathy in retroperitoneal region, and no explanation for her pain.   She had had a persistent cough since October, 2019. CT chest in October, 2019 showed a tiny subpleural nodule in the anterior aspect of the left lower lobe abutting the major fissure. Annual follow-up was recommended, though it was suspected to be benign. CXR on 02/03/17 showed normal heart and lungs.   In 2020 she reported progressive low back pain, sciatic pain and neck pains. Due to her back pain a CT chest abdomen pelvis was performed at [facility] on October 06, 2018.  It revealed a stable 5 x 2 mm perifissural nodule in the left lower lobe of the lung.  No signs of pulmonary metastases.  CT of the abdomen and pelvis revealed a 9 mm short axis right periaortic node that was new and suspicious for recurrent endometrial cancer.  There was no other gross abdominal or retroperitoneal recurrence.   She received pembrolizumab for salvage therapy between 03/15/19 and  07/20/19. She also received salvage radiation (50 Gy in 20 fractions to the PA nodal region) with Dr Sondra Come between 12/17/18 through 01/15/19.  Post-treatment imaging on 06/14/19 showed no  evidence of recurrence/progression.  She developed cough/respiratory symptoms and was seen by pulmonology. It was not felt that her symptoms were secondary to [facility] (eg pneumonitis).   The patient self-discontinued therapy in July, 2021 (plan had been for ongoing [place]) and did not return for follow-up for approximately 1 year.    Interval Hx:  Today she presented for evaluation and follow-up and reported left lower quadrant discomfort, no vaginal bleeding normal GI function.    Current Meds:  Outpatient Encounter Medications as of 06/12/2020  Medication Sig  . Azelastine HCl 0.15 % SOLN Place 1 spray into both nostrils daily.  Marland Kitchen escitalopram (LEXAPRO) 20 MG tablet Take 30 mg by mouth daily.  Marland Kitchen esomeprazole (NEXIUM) 40 MG capsule Take 40 mg by mouth 2 (two) times daily.  . famotidine (PEPCID) 40 MG tablet TAKE 1 (ONE) TABLET TWICE DAILY, WITH BREAKFAST AND AT BEDTIME  . ketoconazole (NIZORAL) 2 % shampoo ketoconazole 2 % shampoo  APPLY TO WASH TWICE WEEKLY  . losartan (COZAAR) 50 MG tablet Take 1 tablet by mouth daily.  Marland Kitchen nystatin cream (MYCOSTATIN)   . rosuvastatin (CRESTOR) 40 MG tablet Take 40 mg by mouth daily.  . solifenacin (VESICARE) 10 MG tablet Vesicare 10 mg tablet  TAKE 1 TABLET BY MOUTH EVERY DAY  . albuterol (PROVENTIL) (2.5 MG/3ML) 0.083% nebulizer solution  (Patient not taking: No sig reported)  . B Complex Vitamins (VITAMIN-B COMPLEX) TABS Take 1 tablet by mouth daily.  (Patient not taking: No sig reported)  . dicyclomine (BENTYL) 10 MG capsule Take 10 mg by mouth 2 (two) times daily as needed for spasms.  (Patient not taking: No sig reported)  . EPINEPHrine 0.3 mg/0.3 mL IJ SOAJ injection Inject into the muscle as directed. Inject into the muscle as directed (Patient not taking: No sig reported)  . fluticasone (FLONASE) 50 MCG/ACT nasal spray INSTILL 2 SPRAYS INTO EACH NOSTRIL ONCE A DAY (Patient not taking: No sig reported)  . levothyroxine (SYNTHROID) 112 MCG  tablet Take 112 mcg by mouth daily.  . ondansetron (ZOFRAN) 8 MG tablet Take 1 tablet (8 mg total) by mouth 2 (two) times daily as needed (Nausea or vomiting). (Patient not taking: No sig reported)  . pravastatin (PRAVACHOL) 20 MG tablet TAKE 1 TABLET BY MOUTH DAILY (Patient not taking: No sig reported)  . pregabalin (LYRICA) 150 MG capsule Take 150 mg by mouth 2 (two) times daily. (Patient not taking: No sig reported)  . prochlorperazine (COMPAZINE) 10 MG tablet Take 1 tablet (10 mg total) by mouth every 6 (six) hours as needed for nausea or vomiting. (Patient not taking: No sig reported)  . QUEtiapine (SEROQUEL) 50 MG tablet Take 50 mg by mouth daily. (Patient not taking: No sig reported)   No facility-administered encounter medications on file as of 06/12/2020.    Allergy:  Allergies  Allergen Reactions  . Codeine Nausea And Vomiting  . Other   . Sulfa Antibiotics Nausea And Vomiting    Social Hx:   Social History   Socioeconomic History  . Marital status: Married    Spouse name: Richard  . Number of children: 3  . Years of education: Not on file  . Highest education level: Not on file  Occupational History  . Occupation: Non-working, applying disability  Tobacco Use  . Smoking status: Former  Smoker    Packs/day: 1.00    Years: 10.00    Pack years: 10.00    Types: Cigarettes    Quit date: 12/05/1988    Years since quitting: 31.5  . Smokeless tobacco: Never Used  Vaping Use  . Vaping Use: Never used  Substance and Sexual Activity  . Alcohol use: No  . Drug use: No  . Sexual activity: Yes    Partners: Male  Other Topics Concern  . Not on file  Social History Narrative   Taking care of her grandson who has autism, 43 years old   Social Determinants of Radio broadcast assistant Strain: Not on file  Food Insecurity: Not on file  Transportation Needs: Not on file  Physical Activity: Not on file  Stress: Not on file  Social Connections: Not on file  Intimate  Partner Violence: Not on file    Past Surgical Hx:  Past Surgical History:  Procedure Laterality Date  . CHOLECYSTECTOMY  2000  . COLECTOMY  2016  . colonscopy    . ROBOTIC ASSISTED TOTAL HYSTERECTOMY WITH BILATERAL SALPINGO OOPHERECTOMY Bilateral 01/18/2016   Procedure: XI ROBOTIC ASSISTED TOTAL HYSTERECTOMY WITH BILATERAL SALPINGO OOPHORECTOMY WITH SENTINAL LYMPH NODE BIOPSY; LYSIS OF ADHESIONS;  Surgeon: Everitt Amber, MD;  Location: [facility];  Service: Gynecology;  Laterality: Bilateral;  . TUBAL LIGATION  1991  . UPPER GI ENDOSCOPY  01/10/2016    Past Medical Hx:  Past Medical History:  Diagnosis Date  . Acquired hypothyroidism 04/26/2019  . Arthritis   . Back pain 02/03/2017  . Complication of anesthesia 01/10/2016   when [place] feels like lump in throat had endo for [place] feeling continues  . Coronary artery disease involving native coronary artery of native heart without angina pectoris 09/08/2014  . DDD (degenerative disc disease), cervical 03/03/2014  . Depression   . Diverticulitis   . Elevated liver enzymes 04/05/2019  . Endometrial cancer ([place])   . Essential hypertension   . 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 Family history of brain cancer   . Family history of breast cancer   . Family history of colon cancer   . Genetic testing 05/01/2016   Negative genetic testing on the TumorNext Lynch with CancerNext.  This is paired germline and tumor analyses for enhanced diagnosis of lynch syndrome plus analyses of 29 additional genes associated with hereditary cancer.  The CancerNext gene panel offered by [employer] includes sequencing and rearrangement analysis for the following 34 genes:   APC, ATM, BARD1, BMPR1A, BRCA1, BRCA2, BRIP1, CD  . GERD (gastroesophageal reflux disease)   . Goals of care, counseling/discussion 03/02/2019  . H. pylori infection   . Headache    migraines  . Heart palpitations   . History of hiatal hernia   .  Hyperlipidemia   . IBS (irritable bowel syndrome)   . LTBI (latent tuberculosis infection) 12/06/2015  . Lumbar spondylosis 11/05/2017  . OSA (obstructive sleep apnea)    not used  last 3 weeks due to cold cpap setting of 11  . 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 Sinusitis, acute 05/25/2019  . Solid malignant neoplasm with high-frequency microsatellite instability (MSI-H) ([place]) 03/05/2019  . Urinary incontinence     Past Gynecological History:  SVD x 3 No LMP recorded. Patient is postmenopausal.  Family Hx:  Family History  Problem Relation Age of Onset  . COPD Mother   .  CAD Father   . Cancer Maternal Grandmother 80       breast ca  . Cancer Maternal Aunt        lung ca  . Cancer Maternal Uncle        brain ca  . Pulmonary embolism Sister   . Heart attack Brother   . COPD Brother   . Colon cancer Maternal Uncle   . Brain cancer Cousin        dx under 50    Review of Systems:  Constitutional  Feels well,    ENT Normal appearing ears and nares bilaterally Skin/Breast  No rash, sores, jaundice, itching, dryness Cardiovascular  No chest pain, shortness of breath, or edema  Pulmonary  No cough no wheeze.  Gastro Intestinal  No nausea, vomitting, or diarrhoea. No bright red blood per rectum, no abdominal pain, change in bowel movement, or constipation.  Genito Urinary  + frequency, + urgency, no dysuria, no bleeding Musculo Skeletal  + back pain (left lumbar) Neurologic  + radicular pain down left leg Psychology  No depression, anxiety, insomnia.   Vitals:  Blood pressure 117/72, pulse 76, temperature (!) 97.3 F (36.3 C), temperature source Tympanic, resp. rate 18, height $RemoveBe'5\' 6"'[id number]$  (1.676 m), weight 232 lb 8 oz (105.5 kg), SpO2 96 %.  Physical Exam: WD in NAD Neck  Supple NROM, without any enlargements.  Lymph Node Survey No cervical supraclavicular or inguinal adenopathy Cardiovascular  Pulse normal rate, regularity  and rhythm. S1 and S2 normal.  Lungs  Clear to auscultation bilateraly, without wheezes/crackles/rhonchi. Good air movement.  Skin  No rash/lesions/breakdown  Psychiatry  Alert and oriented to person, place, and time  Abdomen  Normoactive bowel sounds, abdomen soft, non-tender and obese without evidence of hernia.  Back No CVA tenderness Genito Urinary: vagina normal, no lesions, no blood. No masses.  Rectal  deferred.  Extremities  No bilateral cyanosis, clubbing or edema.  Thereasa Solo, MD  06/12/2020, 4:45 PM

## 2020-06-14 ENCOUNTER — Telehealth: Payer: Self-pay

## 2020-06-14 NOTE — Telephone Encounter (Signed)
LM for Allison Spencer stating that she is scheduled for a CT Scan on Monday 06-19-20 at 3 pm.  She cannot eat or drink anything after 1 pm. She needs to pick up the oral prep by  Friday 4:30 pm.  Faxed order and prior authorization information to [facility] at [phone].

## 2020-06-15 ENCOUNTER — Encounter: Payer: Self-pay | Admitting: Hematology and Oncology

## 2020-06-15 NOTE — Telephone Encounter (Signed)
Patient given the information for the CT scan at [facility].

## 2020-06-20 ENCOUNTER — Telehealth: Payer: Self-pay

## 2020-06-20 NOTE — Telephone Encounter (Signed)
Told ms Sahagian that the scan shows not acute finding. No evidence of recurrent or metastatic carcinoma with in the abdomen or pelvis Clonic diverticulosis. No rediographic evidence of diverticulitis. Stable moderate hiatal hernia per Melissa Cross,NP. Keep appointment  as scheduled in December with Dr. Denman George. Pt verbalized understanding.

## 2020-09-15 ENCOUNTER — Telehealth: Payer: Self-pay | Admitting: *Deleted

## 2020-09-15 NOTE — Telephone Encounter (Signed)
/  Called and moved the patient's appt from 9/ to 9/12

## 2020-09-17 NOTE — Progress Notes (Unsigned)
Follow-up Note: Gyn-Onc  Consult was initially requested by Dr. Cyndie Chime for the evaluation of Allison Spencer 60 y.o. female  CC:  Chief Complaint  Patient presents with   Endometrial cancer [facility])    Recurrent     Assessment/Plan:  [name]  is a 60 y.o.  year old with recurrent grade 1 endometrioid endometrial cancer diagnosed in January, 2018, recurrence diagnosed 10/06/18. 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  Completed therapy   S/p salvage therapy with pembrolizumab and radiation to para-aortic nodes.  Complete clinical response. ***Will re-evaluate with CT imaging (without contrast due to contrast inavailability) at [facility] (it has been a year since last imaging).  If this demonstrates progression, she will need to be re-evaluated for additional salvage chemotherapy. If this demonstrates no evidence of disease, she will continue surveillance examinations at a 3 monthly intervals until July, 2023. She will follow-up with my partner, Dr Pricilla Holm, as I am leaving the practice.   HPI: Allison Spencer is a 60 year old G3P3 who is seen in consultation at the request of Dr Cyndie Chime for grade 1 endometrial cancer. She was for a routine pap smear by her PCP, Mauricio Po, FNP on 07/05/15. This revealed atypical glandular cells - endometrial. She was then seen by Dr Cyndie Chime on 11/24/15 and reported a single episode of light pink spotting but otherwise no bleeding. On 11/24/15 Dr Cyndie Chime performed an endometrial biopsy which revealed well differentiated (FIGO grade 1) endometrioid endometrial cancer. On 01/18/16 she underwent robotic assisted total hysterectomy, BSO, and SLN biopsy. Surgery was uncomplicated and there were no suspicious findings intraoperatively.  Final pathology revealed a stage IIIC1 endometrioid endometrial adenocarcinoma with a 3.3cm tumor  invading <50% (1cm of 2.4cm) with no LVSI. However, bilateral obturator SLN's revealed micrometastases on both H&E and IHC.  Postoperative imaging (including CT abdo/pelvis and chest x ray) revealed no enlarged nodes, residual disease or chest disease.  The patient was recommended to received 6 cycles of adjuvant carboplatin and paclitaxel in accordance with NCCN guidelines. She has met with medical oncology, Dr Artis Delay, but declined adjuvant chemotherapy as she was concerned about immunity. She elected for an "anti-cancer" diet.  She was tested for Lynch syndrome (her tumor is MSI positive with loss of MLH1 expression), however, Lynch syndrome testing was negative.   In 2019 she reported + radicular pain down left leg. She has a known disc prolapse and is seeing an orthopedic surgeon. Repeat CT scan on 05/26/17 showed no evidence of recurrence including no adenopathy in retroperitoneal region, and no explanation for her pain.   She had had a persistent cough since October, 2019. CT chest in October, 2019 showed a tiny subpleural nodule in the anterior aspect of the left lower lobe abutting the major fissure. Annual follow-up was recommended, though it was suspected to be benign. CXR on 02/03/17 showed normal heart and lungs.   In 2020 she reported progressive low back pain, sciatic pain and neck pains. Due to her back pain a CT chest abdomen pelvis was performed at [facility] on October 06, 2018.  It revealed a stable 5 x 2 mm perifissural nodule in the left lower lobe of the lung.  No signs of pulmonary metastases.  CT of the abdomen and pelvis revealed a 9 mm short axis right periaortic node that was new and suspicious for recurrent endometrial cancer.  There was no other gross abdominal or retroperitoneal recurrence.   She received pembrolizumab for salvage therapy between 03/15/19 and 07/20/19. She also received salvage radiation (50 Gy in 20 fractions to the PA nodal region) with Dr  Sondra Come between 12/17/18 through 01/15/19.  Post-treatment imaging on 06/14/19 showed no evidence of recurrence/progression.  She developed cough/respiratory symptoms and was seen by pulmonology. It was not felt that her symptoms were secondary to [facility] (eg pneumonitis).   The patient self-discontinued therapy in July, 2021 (plan had been for ongoing [place]) and did not return for follow-up for approximately 1 year.    Interval Hx:  Today she presented for evaluation and follow-up and reported left lower quadrant discomfort, no vaginal bleeding normal GI function.    Current Meds:  Outpatient Encounter Medications as of 09/18/2020  Medication Sig   albuterol (PROVENTIL) (2.5 MG/3ML) 0.083% nebulizer solution  (Patient not taking: No sig reported)   Azelastine HCl 0.15 % SOLN Place 1 spray into both nostrils daily.   B Complex Vitamins (VITAMIN-B COMPLEX) TABS Take 1 tablet by mouth daily.  (Patient not taking: No sig reported)   dicyclomine (BENTYL) 10 MG capsule Take 10 mg by mouth 2 (two) times daily as needed for spasms.  (Patient not taking: No sig reported)   EPINEPHrine 0.3 mg/0.3 mL IJ SOAJ injection Inject into the muscle as directed. Inject into the muscle as directed (Patient not taking: No sig reported)   escitalopram (LEXAPRO) 20 MG tablet Take 30 mg by mouth daily.   esomeprazole (NEXIUM) 40 MG capsule Take 40 mg by mouth 2 (two) times daily.   famotidine (PEPCID) 40 MG tablet TAKE 1 (ONE) TABLET TWICE DAILY, WITH BREAKFAST AND AT BEDTIME   fluticasone (FLONASE) 50 MCG/ACT nasal spray INSTILL 2 SPRAYS INTO EACH NOSTRIL ONCE A DAY (Patient not taking: No sig reported)   ketoconazole (NIZORAL) 2 % shampoo ketoconazole 2 % shampoo  APPLY TO WASH TWICE WEEKLY   levothyroxine (SYNTHROID) 112 MCG tablet Take 112 mcg by mouth daily.   losartan (COZAAR) 50 MG tablet Take 1 tablet by mouth daily.   nystatin cream (MYCOSTATIN)    ondansetron (ZOFRAN) 8 MG tablet Take 1 tablet (8 mg  total) by mouth 2 (two) times daily as needed (Nausea or vomiting). (Patient not taking: No sig reported)   pravastatin (PRAVACHOL) 20 MG tablet TAKE 1 TABLET BY MOUTH DAILY (Patient not taking: No sig reported)   pregabalin (LYRICA) 150 MG capsule Take 150 mg by mouth 2 (two) times daily. (Patient not taking: No sig reported)   prochlorperazine (COMPAZINE) 10 MG tablet Take 1 tablet (10 mg total) by mouth every 6 (six) hours as needed for nausea or vomiting. (Patient not taking: No sig reported)   QUEtiapine (SEROQUEL) 50 MG tablet Take 50 mg by mouth daily. (Patient not taking: No sig reported)   rosuvastatin (CRESTOR) 40 MG tablet Take 40 mg by mouth daily.   solifenacin (VESICARE) 10 MG tablet Vesicare 10 mg tablet  TAKE 1 TABLET BY MOUTH EVERY DAY   No facility-administered encounter medications on file as of 09/18/2020.    Allergy:  Allergies  Allergen Reactions   Codeine Nausea And Vomiting   Other    Sulfa Antibiotics Nausea And Vomiting    Social Hx:   Social History   Socioeconomic History   Marital status: Married    Spouse name: Richard   Number of children: 3   Years of education: Not on file   Highest education level:  Not on file  Occupational History   Occupation: Non-working, applying disability  Tobacco Use   Smoking status: Former    Packs/day: 1.00    Years: 10.00    Pack years: 10.00    Types: Cigarettes    Quit date: 12/05/1988    Years since quitting: 31.8   Smokeless tobacco: Never  Vaping Use   Vaping Use: Never used  Substance and Sexual Activity   Alcohol use: No   Drug use: No   Sexual activity: Yes    Partners: Male  Other Topics Concern   Not on file  Social History Narrative   Taking care of her grandson who has autism, 56 years old   Social Determinants of Health   Financial Resource Strain: Not on file  Food Insecurity: Not on file  Transportation Needs: Not on file  Physical Activity: Not on file  Stress: Not on file  Social  Connections: Not on file  Intimate Partner Violence: Not on file    Past Surgical Hx:  Past Surgical History:  Procedure Laterality Date   CHOLECYSTECTOMY  2000   COLECTOMY  2016   colonscopy     ROBOTIC ASSISTED TOTAL HYSTERECTOMY WITH BILATERAL SALPINGO OOPHERECTOMY Bilateral 01/18/2016   Procedure: XI ROBOTIC ASSISTED TOTAL HYSTERECTOMY WITH BILATERAL SALPINGO OOPHORECTOMY WITH SENTINAL LYMPH NODE BIOPSY; LYSIS OF ADHESIONS;  Surgeon: Everitt Amber, MD;  Location: [facility];  Service: Gynecology;  Laterality: Bilateral;   TUBAL LIGATION  1991   UPPER GI ENDOSCOPY  01/10/2016    Past Medical Hx:  Past Medical History:  Diagnosis Date   Acquired hypothyroidism 04/26/2019   Arthritis    Back pain 0/86/7619   Complication of anesthesia 01/10/2016   when [place] feels like lump in throat had endo for [place] feeling continues   Coronary artery disease involving native coronary artery of native heart without angina pectoris 09/08/2014   DDD (degenerative disc disease), cervical 03/03/2014   Depression    Diverticulitis    Elevated liver enzymes 04/05/2019   Endometrial cancer ([place])    Essential hypertension    Exposure to TB    latent takes rifadin for 4 months for started nov 2017   Family history of brain cancer    Family history of breast cancer    Family history of colon cancer    Genetic testing 05/01/2016   Negative genetic testing on the TumorNext Lynch with CancerNext.  This is paired germline and tumor analyses for enhanced diagnosis of lynch syndrome plus analyses of 29 additional genes associated with hereditary cancer.  The CancerNext gene panel offered by [employer] includes sequencing and rearrangement analysis for the following 34 genes:   APC, ATM, BARD1, BMPR1A, BRCA1, BRCA2, BRIP1, CD   GERD (gastroesophageal reflux disease)    Goals of care, counseling/discussion 03/02/2019   H. 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 Patient is  postmenopausal.  Family Hx:  Family History  Problem Relation Age of Onset   COPD Mother    CAD Father    Cancer Maternal Grandmother 71       breast ca   Cancer Maternal Aunt        lung ca   Cancer Maternal Uncle        brain ca   Pulmonary embolism Sister    Heart attack Brother    COPD Brother    Colon cancer Maternal Uncle    Brain cancer Cousin        dx under 17    Review of Systems:  Constitutional  Feels well,    ENT Normal appearing ears and nares bilaterally Skin/Breast  No rash, sores, jaundice, itching, dryness Cardiovascular  No chest pain, shortness of breath, or edema  Pulmonary  No cough no wheeze.  Gastro Intestinal  No nausea, vomitting, or diarrhoea. No bright red blood per rectum, no abdominal pain, change in bowel movement, or constipation.  Genito Urinary  + frequency, + urgency, no dysuria, no bleeding Musculo Skeletal  + back pain (left lumbar) Neurologic  + radicular pain down left leg Psychology  No depression, anxiety, insomnia.   Vitals:  There were no vitals taken for this visit.  Physical Exam: WD in NAD Neck  Supple NROM, without any enlargements.  Lymph Node Survey No cervical supraclavicular or inguinal adenopathy Cardiovascular  Pulse normal rate, regularity and rhythm. S1 and S2 normal.  Lungs  Clear to auscultation bilateraly, without wheezes/crackles/rhonchi. Good air movement.  Skin  No  rash/lesions/breakdown  Psychiatry  Alert and oriented to person, place, and time  Abdomen  Normoactive bowel sounds, abdomen soft, non-tender and obese without evidence of hernia.  Back No CVA tenderness Genito Urinary: vagina normal, no lesions, no blood. No masses.  Rectal  deferred.  Extremities  No bilateral cyanosis, clubbing or edema.  Thereasa Solo, MD  09/17/2020, 6:20 PM

## 2020-09-18 ENCOUNTER — Telehealth: Payer: Self-pay | Admitting: *Deleted

## 2020-09-18 ENCOUNTER — Inpatient Hospital Stay: Payer: Medicare Other | Admitting: Gynecologic Oncology

## 2020-09-18 DIAGNOSIS — C541 Malignant neoplasm of endometrium: Secondary | ICD-10-CM

## 2020-09-18 NOTE — Telephone Encounter (Signed)
Attempted to reach the patient to reschedule her appt per her request. Left a message to call the office back

## 2020-09-20 ENCOUNTER — Encounter: Payer: Self-pay | Admitting: Hematology and Oncology

## 2020-09-27 ENCOUNTER — Ambulatory Visit: Payer: Medicare Other | Admitting: Gynecologic Oncology

## 2020-09-28 ENCOUNTER — Encounter: Payer: Self-pay | Admitting: Gynecologic Oncology

## 2020-10-02 ENCOUNTER — Encounter: Payer: Self-pay | Admitting: Gynecologic Oncology

## 2020-10-02 ENCOUNTER — Inpatient Hospital Stay: Payer: Medicare Other | Attending: Gynecologic Oncology | Admitting: Gynecologic Oncology

## 2020-10-02 ENCOUNTER — Other Ambulatory Visit: Payer: Self-pay

## 2020-10-02 VITALS — BP 118/62 | HR 77 | Temp 98.2°F | Resp 18 | Ht 66.0 in | Wt 236.0 lb

## 2020-10-02 DIAGNOSIS — Z923 Personal history of irradiation: Secondary | ICD-10-CM | POA: Insufficient documentation

## 2020-10-02 DIAGNOSIS — Z9221 Personal history of antineoplastic chemotherapy: Secondary | ICD-10-CM | POA: Diagnosis not present

## 2020-10-02 DIAGNOSIS — Z90722 Acquired absence of ovaries, bilateral: Secondary | ICD-10-CM | POA: Insufficient documentation

## 2020-10-02 DIAGNOSIS — Z90710 Acquired absence of both cervix and uterus: Secondary | ICD-10-CM | POA: Diagnosis not present

## 2020-10-02 DIAGNOSIS — C541 Malignant neoplasm of endometrium: Secondary | ICD-10-CM

## 2020-10-02 DIAGNOSIS — Z8542 Personal history of malignant neoplasm of other parts of uterus: Secondary | ICD-10-CM | POA: Diagnosis not present

## 2020-10-02 NOTE — Progress Notes (Signed)
Follow-up Note: Gyn-Onc  Consult was initially requested by Dr. Alfonse Spruce for the evaluation of Allison Spencer 60 y.o. female  CC:  Chief Complaint  Patient presents with   Endometrial cancer [facility])    Assessment/Plan:  Allison Spencer  is a 60 y.o.  year old with recurrent grade 1 endometrioid endometrial cancer diagnosed in January, 2018, recurrence diagnosed 10/06/18. 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  Completed therapy July, 2021  S/p salvage therapy with pembrolizumab and radiation to para-aortic nodes.  Complete clinical and radiographic response.  She will continue surveillance examinations at a 3 monthly intervals until July, 2023. She will follow-up with my partner, Dr Berline Lopes, as I am leaving the practice.  I recommend repeat CT abd/pelvis in June, 2023 (at [place]) to monitor the nodal basins.   HPI: Allison Spencer is a 60 year old G3P3 who is seen in consultation at the request of Dr Alfonse Spruce for grade 1 endometrial cancer. She was for a routine pap smear by her PCP, Heide Scales, FNP on 07/05/15. This revealed atypical glandular cells - endometrial. She was then seen by Dr Alfonse Spruce on 11/24/15 and reported a single episode of light pink spotting but otherwise no bleeding. 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 On 01/18/16 she underwent robotic assisted total hysterectomy, BSO, and SLN biopsy. Surgery was uncomplicated and there were no suspicious findings intraoperatively.  Final pathology revealed a stage IIIC1 endometrioid endometrial adenocarcinoma with a 3.3cm tumor invading <50% (1cm of 2.4cm) with no LVSI. However, bilateral obturator SLN's revealed micrometastases on both H&E and IHC.  Postoperative imaging (including CT abdo/pelvis and c  hest  x ray) revealed no enlarged nodes, residual disease or chest disease.  The patient was recommended to received 6 cycles of adjuvant carboplatin and paclitaxel in accordance with NCCN guidelines. She has met with medical oncology, Dr Heath Lark, but declined adjuvant chemotherapy as she was concerned about immunity. She elected for an "anti-cancer" diet.  She was tested for Lynch syndrome (her tumor is MSI positive with loss of MLH1 expression), however, Lynch syndrome testing was negative.   In 2019 she reported + radicular pain down left leg. She has a known disc prolapse and is seeing an orthopedic surgeon. Repeat CT scan on 05/26/17 showed no evidence of recurrence including no adenopathy in retroperitoneal region, and no explanation for her pain.   She had had a persistent cough since October, 2019. CT chest in October, 2019 showed a tiny subpleural nodule in the anterior aspect of the left lower lobe abutting the major fissure. Annual follow-up was recommended, though it was suspected to be benign. CXR on 02/03/17 showed normal heart and lungs.   In 2020 she reported progressive low back pain, sciatic pain and neck pains. Due to her back pain a CT chest abdomen pelvis was performed at [facility] on October 06, 2018. CT of the abdomen and pelvis revealed a 9 mm short axis right periaortic node that was new and suspicious for recurrent endometrial cancer.  There was no other gross abdominal or retroperitoneal recurrence.   She received pembrolizumab for salvage therapy between 03/15/19 and 07/20/19. She also received salvage radiation (50 Gy in 20 fractions to the PA nodal region) with Dr Sondra Come between 12/17/18 through 01/15/19.  Post-treatment imaging on 06/14/19 showed no evidence of  recurrence/progression.  She developed cough/respiratory symptoms and was seen by pulmonology. It was not felt that her symptoms were secondary to [facility] (eg pneumonitis). The patient self-discontinued therapy  in July, 2021 (plan had been for ongoing [place]) and did not return for follow-up for approximately 1 year.  CT abd/pelvis (surveillance) in June, 2022 was performed at [facility] and showed no evidence of persistent/recurrent disease.     Interval Hx:  Today she presented for evaluation and follow-up and reported left lower quadrant discomfort, no vaginal bleeding. She has some cramping upper abdominal pain after eating (likely secondary to radiation change).   Current Meds:  Outpatient Encounter Medications as of 10/02/2020  Medication Sig   dicyclomine (BENTYL) 10 MG capsule Take 10 mg by mouth 2 (two) times daily as needed for spasms.   EPINEPHrine 0.3 mg/0.3 mL IJ SOAJ injection Inject into the muscle as directed. Inject into the muscle as directed   escitalopram (LEXAPRO) 20 MG tablet Take 30 mg by mouth daily.   esomeprazole (NEXIUM) 40 MG capsule Take 40 mg by mouth 2 (two) times daily.   ketoconazole (NIZORAL) 2 % shampoo ketoconazole 2 % shampoo  APPLY TO WASH TWICE WEEKLY   levothyroxine (SYNTHROID) 112 MCG tablet Take 112 mcg by mouth daily.   losartan (COZAAR) 50 MG tablet Take 1 tablet by mouth daily.   MYRBETRIQ 25 MG TB24 tablet Take 25 mg by mouth daily.   nystatin cream (MYCOSTATIN)    QUEtiapine (SEROQUEL) 50 MG tablet Take 50 mg by mouth daily.   rosuvastatin (CRESTOR) 40 MG tablet Take 40 mg by mouth daily.   XHANCE 93 MCG/ACT EXHU Place 1 spray into both nostrils 2 (two) times daily.   albuterol (PROVENTIL) (2.5 MG/3ML) 0.083% nebulizer solution  (Patient not taking: No sig reported)   Azelastine HCl 0.15 % SOLN Place 1 spray into both nostrils daily. (Patient not taking: Reported on 09/28/2020)   B Complex Vitamins (VITAMIN-B COMPLEX) TABS Take 1 tablet by mouth daily.  (Patient not taking: No sig reported)   famotidine (PEPCID) 40 MG tablet TAKE 1 (ONE) TABLET TWICE DAILY, WITH BREAKFAST AND AT BEDTIME (Patient not taking: Reported on 09/28/2020)   fluticasone  (FLONASE) 50 MCG/ACT nasal spray INSTILL 2 SPRAYS INTO EACH NOSTRIL ONCE A DAY (Patient not taking: No sig reported)   ondansetron (ZOFRAN) 8 MG tablet Take 1 tablet (8 mg total) by mouth 2 (two) times daily as needed (Nausea or vomiting). (Patient not taking: No sig reported)   pravastatin (PRAVACHOL) 20 MG tablet TAKE 1 TABLET BY MOUTH DAILY (Patient not taking: No sig reported)   pregabalin (LYRICA) 150 MG capsule Take 150 mg by mouth 2 (two) times daily. (Patient not taking: No sig reported)   prochlorperazine (COMPAZINE) 10 MG tablet Take 1 tablet (10 mg total) by mouth every 6 (six) hours as needed for nausea or vomiting. (Patient not taking: No sig reported)   solifenacin (VESICARE) 10 MG tablet Vesicare 10 mg tablet  TAKE 1 TABLET BY MOUTH EVERY DAY (Patient not taking: Reported on 09/28/2020)   No facility-administered encounter medications on file as of 10/02/2020.    Allergy:  Allergies  Allergen Reactions   Codeine Nausea And Vomiting   Other    Sulfa Antibiotics Nausea And Vomiting    Social Hx:   Social History   Socioeconomic History   Marital status: Married    Spouse name: Richard   Number of children: 3   Years of education: Not on file  Highest education level: Not on file  Occupational History   Occupation: Non-working, applying disability  Tobacco Use   Smoking status: Former    Packs/day: 1.00    Years: 10.00    Pack years: 10.00    Types: Cigarettes    Quit date: 12/05/1988    Years since quitting: 31.8   Smokeless tobacco: Never  Vaping Use   Vaping Use: Never used  Substance and Sexual Activity   Alcohol use: No   Drug use: No   Sexual activity: Yes    Partners: Male  Other Topics Concern   Not on file  Social History Narrative   Taking care of her grandson who has autism, 73 years old   Social Determinants of Health   Financial Resource Strain: Not on file  Food Insecurity: Not on file  Transportation Needs: Not on file  Physical  Activity: Not on file  Stress: Not on file  Social Connections: Not on file  Intimate Partner Violence: Not on file    Past Surgical Hx:  Past Surgical History:  Procedure Laterality Date   CHOLECYSTECTOMY  2000   COLECTOMY  2016   colonscopy     ROBOTIC ASSISTED TOTAL HYSTERECTOMY WITH BILATERAL SALPINGO OOPHERECTOMY Bilateral 01/18/2016   Procedure: XI ROBOTIC ASSISTED TOTAL HYSTERECTOMY WITH BILATERAL SALPINGO OOPHORECTOMY WITH SENTINAL LYMPH NODE BIOPSY; LYSIS OF ADHESIONS;  Surgeon: Everitt Amber, MD;  Location: [facility];  Service: Gynecology;  Laterality: Bilateral;   TUBAL LIGATION  1991   UPPER GI ENDOSCOPY  01/10/2016    Past Medical Hx:  Past Medical History:  Diagnosis Date   Acquired hypothyroidism 04/26/2019   Arthritis    Back pain 5/46/5035   Complication of anesthesia 01/10/2016   when [place] feels like lump in throat had endo for [place] feeling continues   Coronary artery disease involving native coronary artery of native heart without angina pectoris 09/08/2014   DDD (degenerative disc disease), cervical 03/03/2014   Depression    Diverticulitis    Elevated liver enzymes 04/05/2019   Endometrial cancer ([place])    Essential hypertension    Exposure to TB    latent takes rifadin for 4 months for started nov 2017   Family history of brain cancer    Family history of breast cancer    Family history of colon cancer    Genetic testing 05/01/2016   Negative genetic testing on the TumorNext Lynch with CancerNext.  This is paired germline and tumor analyses for enhanced diagnosis of lynch syndrome plus analyses of 29 additional genes associated with hereditary cancer.  The CancerNext gene panel offered by [employer] includes sequencing and rearrangement analysis for the following 34 genes:   APC, ATM, BARD1, BMPR1A, BRCA1, BRCA2, BRIP1, CD   GERD (gastroesophageal reflux disease)    Goals of care, counseling/discussion 03/02/2019   H. 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 Patient is postmenopausal.  Family Hx:  Family History  Problem Relation Age of Onset   COPD Mother    CAD Father    Cancer Maternal Grandmother 75       breast ca   Cancer Maternal Aunt        lung ca   Cancer Maternal Uncle        brain ca   Pulmonary embolism Sister    Heart attack Brother    COPD Brother    Colon cancer Maternal Uncle    Brain cancer Cousin        dx under 60    Review of Systems:  Constitutional  Feels well,    ENT Normal appearing ears and nares bilaterally Skin/Breast  No rash, sores, jaundice, itching, dryness Cardiovascular  No chest pain, shortness of breath, or edema  Pulmonary  No cough no wheeze.  Gastro Intestinal  No nausea, vomitting, or diarrhoea. No bright red blood per rectum, no abdominal pain, change in bowel movement, or constipation.  Genito Urinary  + frequency, + urgency, no dysuria, no bleeding Musculo Skeletal  + back pain (left lumbar) Neurologic  + radicular pain down left leg Psychology  No depression, anxiety, insomnia.   Vitals:  Blood pressure 118/62, pulse 77, temperature 98.2 F (36.8 C), temperature source Oral, resp. rate 18, height _0  (1.676 m), weight 236 lb (107 kg), SpO2 98 %.  Physical Exam: WD in NAD Neck  Supple NROM, without any enlargements.  Lymph Node Survey No cervical supraclavicular or inguinal adenopathy Cardiovascular  Pulse  normal rate, regularity and rhythm. S1 and S2 normal.  Lungs  Clear to auscultation bilateraly, without wheezes/crackles/rhonchi. Good air movement.  Skin  No rash/lesions/breakdown  Psychiatry  Alert and oriented to person, place, and time  Abdomen  Normoactive bowel sounds, abdomen soft, non-tender and obese without evidence of hernia.  Back No CVA tenderness Genito Urinary: vagina normal, no lesions, no blood. No masses.  Rectal  deferred.  Extremities  No bilateral cyanosis, clubbing or edema.  Thereasa Solo, MD  10/02/2020, 10:34 AM

## 2020-10-02 NOTE — Patient Instructions (Signed)
Please notify Dr Denman George at phone number [phone] if you notice vaginal bleeding, new pelvic or abdominal pains, bloating, feeling full easy, or a change in bladder or bowel function.   Dr Denman George is departing the [place] [facility] in October, 2022. Her partners and colleagues including Dr Berline Lopes, Dr Delsa Sale and Joylene John, Nurse Practitioner will be available to continue your care.   You are next scheduled to return to the Gynecologic Oncology office at the [facility] in December, 2022. Please call [phone] in November to request an appointment for December with Dr Serita Grit partner.

## 2020-12-29 IMAGING — CT CT ABD-PELV W/ CM
3 of 5 series · 16 of 46 positions shown, 18 images · IV contrast (omnipaque)
Comparison: 03/01/2019

CLINICAL DATA: Restaging endometrial cancer. Status post TAH, XRT
with immunotherapy in progress.

EXAM:
CT ABDOMEN AND PELVIS WITH CONTRAST
TECHNIQUE: Multidetector CT imaging of the abdomen and pelvis was performed
using the standard protocol following bolus administration of
intravenous contrast.
CONTRAST:  100mL OMNIPAQUE IOHEXOL 300 MG/ML  SOLN

[Series 3: axial st · axial · 0.93mm/px · z∈[-632,-232]mm · 11 of 96 slices shown, 13 images]
[im 8/96  soft-tissue]
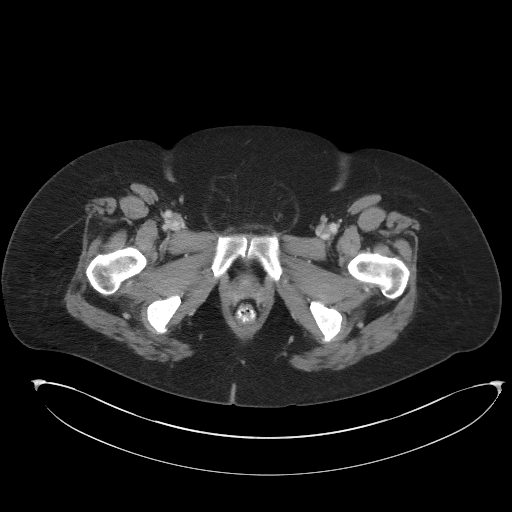
[im 8/96  bone]
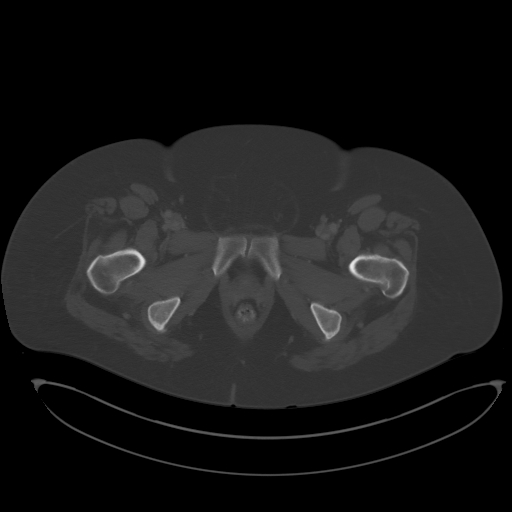
[im 16/96  soft-tissue]
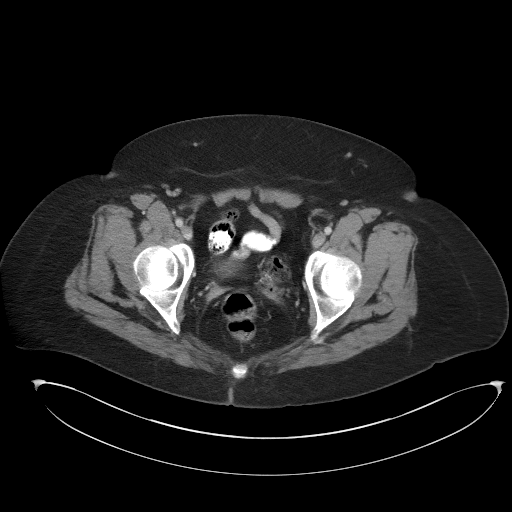
[im 24/96  soft-tissue]
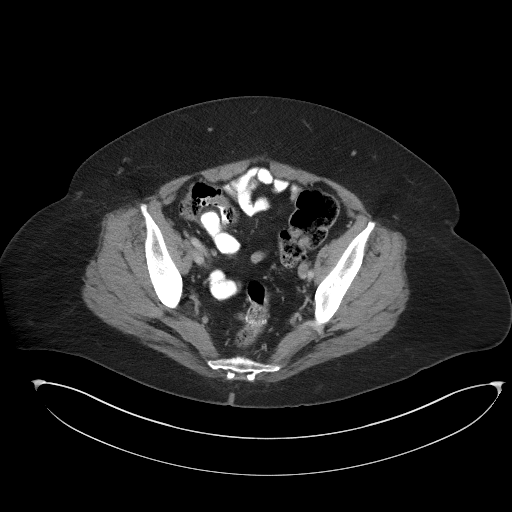
[im 32/96  soft-tissue]
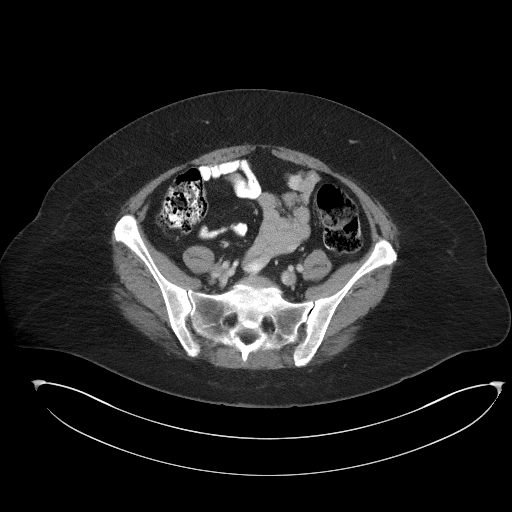
[im 40/96  soft-tissue]
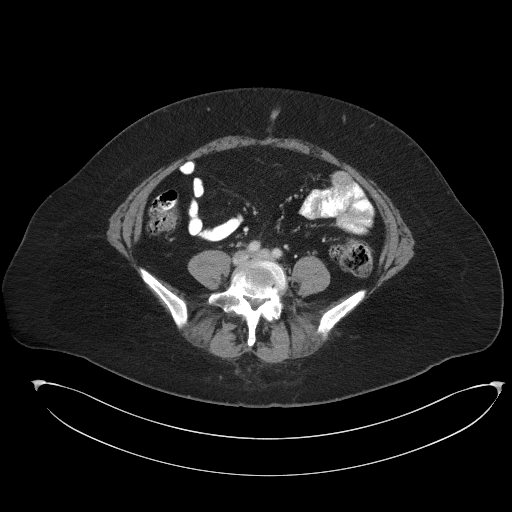
[im 48/96  soft-tissue]
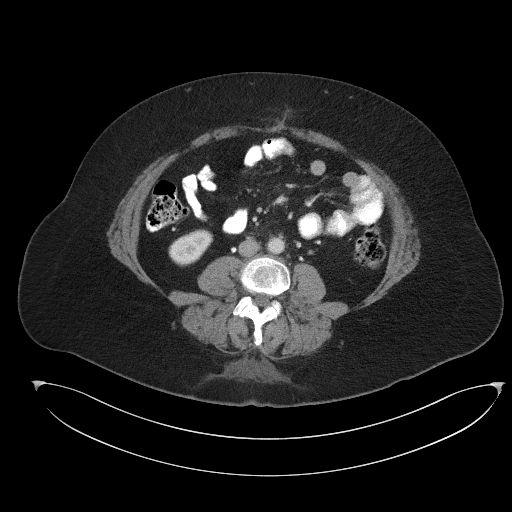
[im 56/96  soft-tissue]
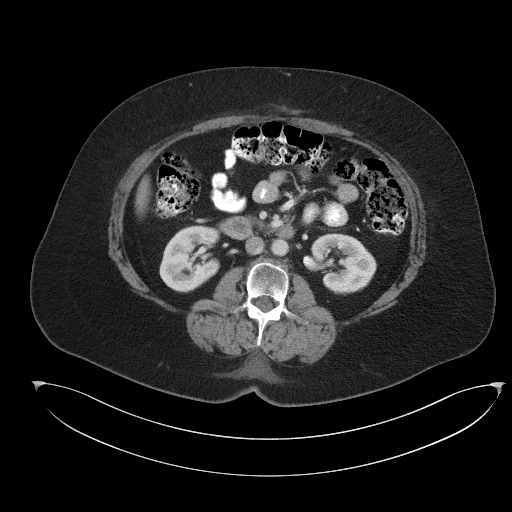
[im 64/96  soft-tissue]
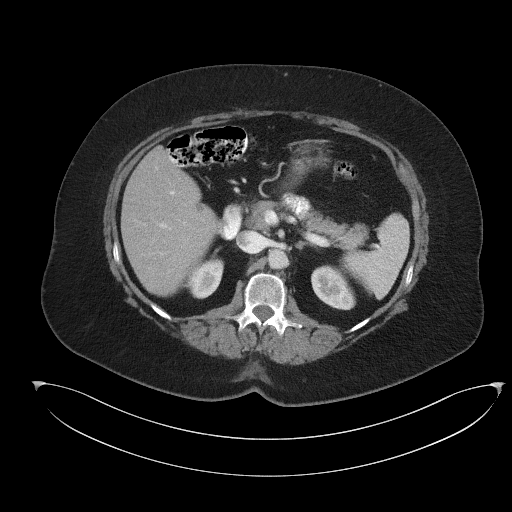
[im 72/96  soft-tissue]
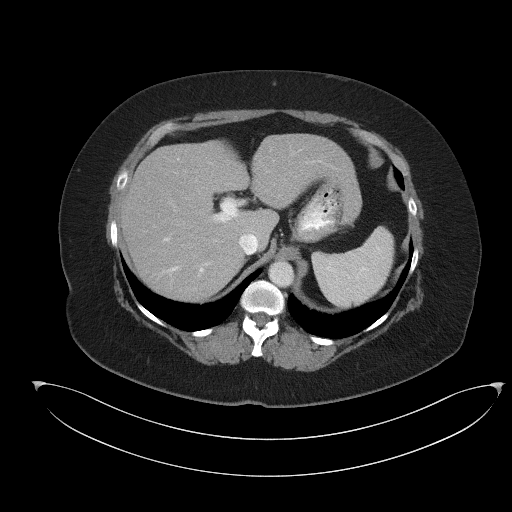
[im 72/96  bone]
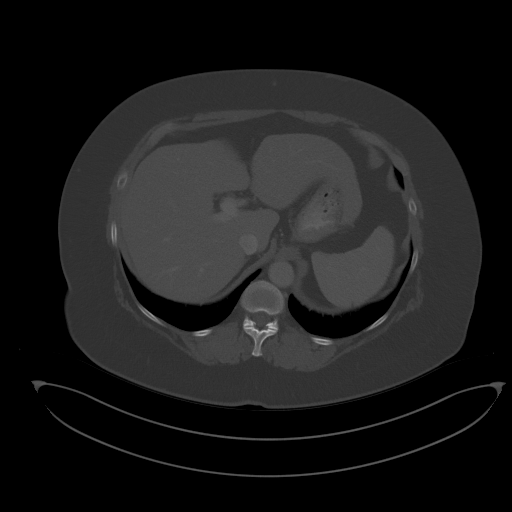
[im 80/96  soft-tissue]
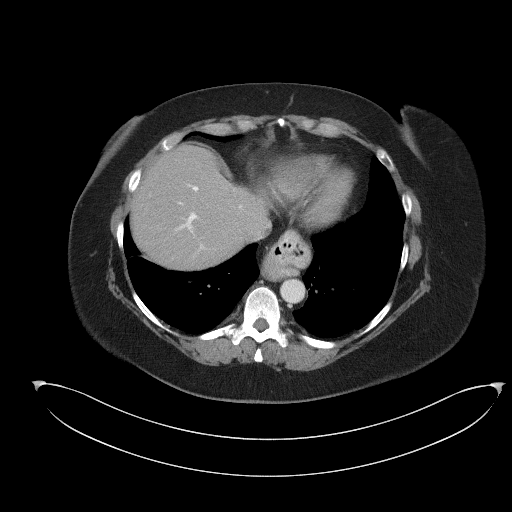
[im 88/96  soft-tissue]
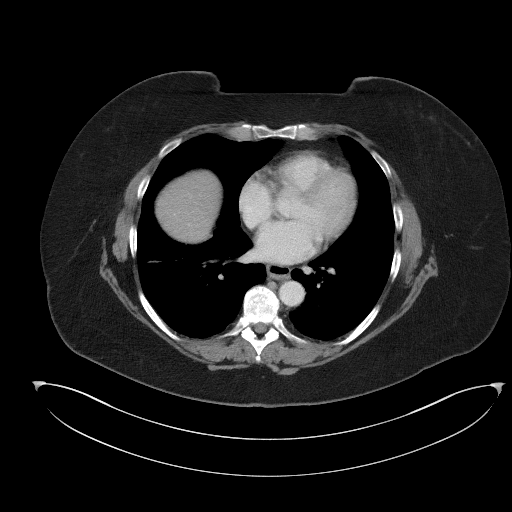

[Series 5: coronal st · coronal · 0.77mm/px · 3 of 104 slices shown]
[im 35/104  soft-tissue]
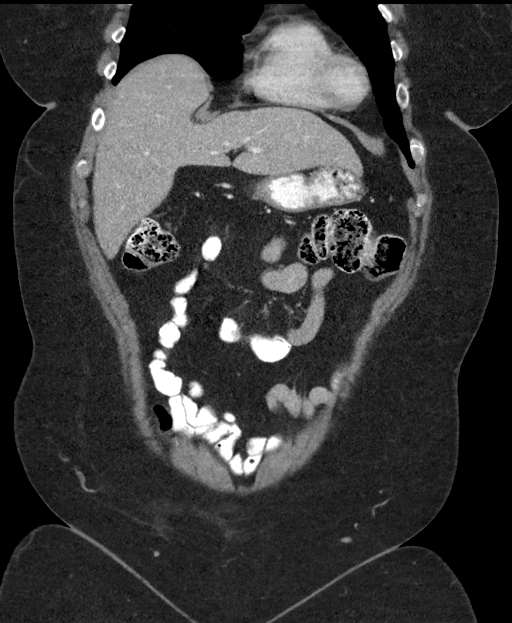
[im 46/104  soft-tissue]
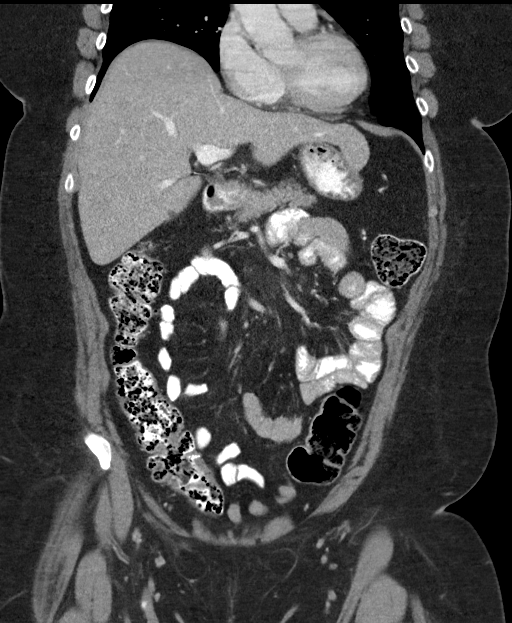
[im 58/104  soft-tissue]
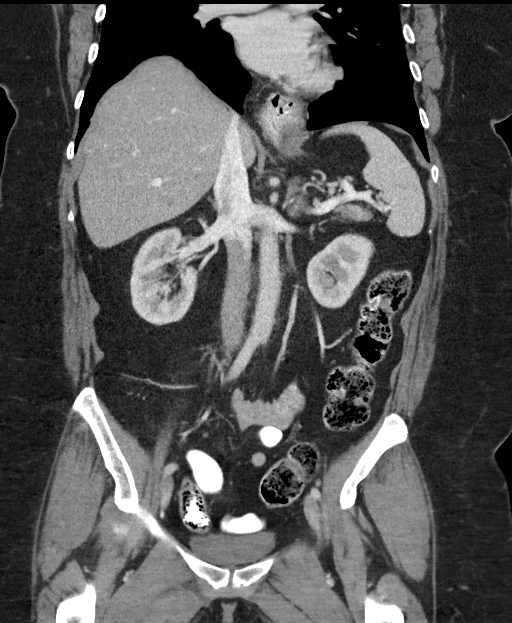

[Series 7: lung bases · axial · 0.93mm/px · z∈[-354,-324]mm · 2 of 89 slices shown]
[im 8/89  bone]
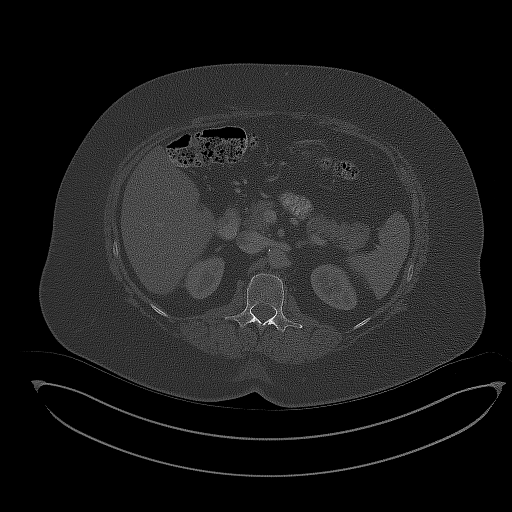
[im 23/89  bone]
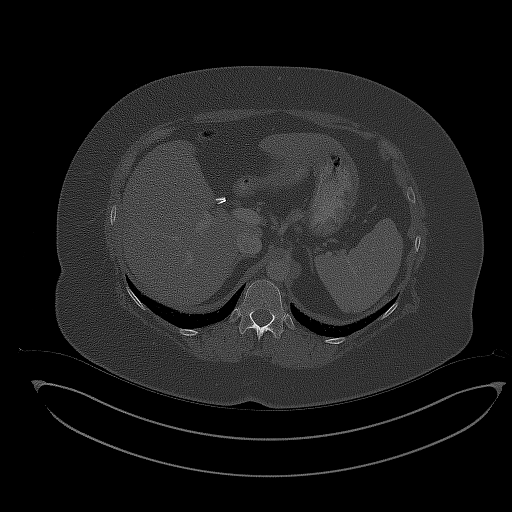

[16 of 46 positions shown; findings below may reference images not displayed]

FINDINGS: Lower chest: No acute abnormality.

Hepatobiliary: 1.5 cm cyst identified within the anterior dome of
liver, image [DATE]. Lateral segment left lobe of liver cyst is
unchanged measuring 0.8 cm. No suspicious liver lesions identified.
Cholecystectomy. No biliary ductal dilatation.

Pancreas: Unremarkable. No pancreatic ductal dilatation or
surrounding inflammatory changes.

Spleen: Normal in size without focal abnormality.

Adrenals/Urinary Tract: Normal appearance of the adrenal glands. No
kidney mass or hydronephrosis. Urinary bladder negative

Stomach/Bowel: Moderate hiatal hernia. Stomach is otherwise within
normal limits. Appendix appears normal. No evidence of bowel wall
thickening, distention, or inflammatory changes.

Vascular/Lymphatic: No significant vascular findings are present. No
enlarged abdominal or pelvic lymph nodes.

Reproductive: Status post hysterectomy. No adnexal masses.

Other: No ascites or focal fluid collections. There is no peritoneal
nodule or mass identified.

Musculoskeletal: Degenerative disc disease identified within the
scratch set mild multilevel spondylosis identified within the
thoracolumbar spine. No acute or suspicious osseous findings.
IMPRESSION: 1. No acute findings within the abdomen or pelvis. No findings of
recurrent tumor or metastatic disease.
2. Hiatal hernia.

## 2021-01-02 NOTE — Progress Notes (Signed)
Gynecologic Oncology Return Clinic Visit  01/03/21  Reason for Visit: surveillance visit in the setting of endometrial cancer  Treatment History: Oncology History Overview Note  MSI high disease detected   Endometrial cancer ([place])  07/05/2015 Pathology Results   She had abnormal PAP   11/24/2015 Procedure   She underwent endometrial sampling that came back abnormal   01/10/2016 Procedure   She had EGD which showed esophagitis   01/18/2016 Pathology Results   Diagnosis 1. Lymph node, sentinel, biopsy, right obturator - MICROMETASTASIS IN ONE OF ONE LYMPH NODES (1/1). 2. Lymph node, sentinel, biopsy, left external - ONE OF ONE LYMPH NODES NEGATIVE FOR CARCINOMA (0/1). 3. Lymph node, sentinel, biopsy, left obturator - MICROMETASTASIS IN ONE OF ONE LYMPH NODES (1/1). 4. Uterus +/- tubes/ovaries, neoplastic - UTERUS: -ENDOMYOMETRIUM: ENDOMETRIOID ADENOCARCINOMA, FIGO GRADE 1, SPANNING 3.3 CM. TUMOR INVADES LESS THAN 1/2 OF THE MYOMETRIUM. SEE ONCOLOGY TABLE. -SEROSA: UNREMARKABLE. NO MALIGNANCY IDENTIFIED. - CERVIX: BENIGN SQUAMOUS AND ENDOCERVICAL MUCOSA. NO DYSPLASIA OR MALIGNANCY. - BILATERAL OVARIES: INCLUSION CYSTS. NO MALIGNANCY. - BILATERAL FALLOPIAN TUBES: UNREMARKABLE RIGHT TUBE. LEFT TUBE IS NOT IDENTIFIED GROSSLY OR MICROSCOPICALLY.   01/18/2016 Surgery   She underwent robotic-assisted laparoscopic total hysterectomy with bilateral salpingoophorectomy, sentinel lymph node biopsy, lysis of adhesions   04/24/2016 Genetic Testing   Negative genetic testing on the TumorNext Lynch with CancerNext.  This is paired germline and tumor analyses for enhanced diagnosis of lynch syndrome plus analyses of 29 additional genes associated with hereditary cancer.  The CancerNext gene panel offered by [employer] includes sequencing and rearrangement analysis for the following 34 genes:   APC, ATM, BARD1, BMPR1A, BRCA1, BRCA2, BRIP1, CDH1, CDK4, CDKN2A, CHEK2, DICER, HOXB13, EPCAM, GREM1,  MLH1, MRE11A, MSH2, MSH6, MUTYH, NBN, NF1, PALB2, PMS2, POLD1, POLE, PTEN, RAD50, RAD51C, RAD51D, SMAD4, SMARCA4, STK11, and TP53.   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  Taken with the germline results demonstrating absence of pathogenic mutations or likely pathogenic variants (VLPs) in the mismatch repair (MMR) genes, the likelihood that this individual has Lynch syndrome/HNPCC is greatly decreased; however, the possibility of an undetected variant of either germline or somatic origin due to mosaicism and other rare etiologies cannot be ruled out by the current methodology.  Correlation with clinical history and external tumor testing results is advised.    10/29/2018 PET scan   Outside PET scan showed enlarged aorta-caval LN   12/17/2018 - 01/15/2019 Radiation Therapy   Radiation Treatment Dates: 12/17/2018 through 01/15/2019 Site Technique Total Dose (Gy) Dose per Fx (Gy) Completed Fx Beam Energies  Abdomen: Abd_PA node 3D 50/50 2.5 20/20 6X, 15X        03/01/2019 Imaging   1. No CT findings for metastatic disease involving the chest, abdomen or pelvis. 2. Stable moderate-sized hiatal hernia. 3. Status post cholecystectomy. No biliary dilatation. 4. Stable hepatic cysts.   03/15/2019 -  Chemotherapy   The patient had pembrolizumab (KEYTRUDA) 200 mg in sodium chloride 0.9 % 50 mL chemo infusion, 200 mg, Intravenous, Once, 6 of 7 cycles Dose modification: 400 mg (original dose 200 mg, Cycle 6, Reason: Other (see comments)) Administration: 200 mg (03/15/2019), 200 mg (04/26/2019), 200 mg (05/25/2019), 400 mg (07/20/2019), 200 mg (04/05/2019), 200 mg (06/15/2019)   for chemotherapy treatment.     06/14/2019 Imaging   1. No acute findings within the abdomen or pelvis. No findings of recurrent tumor or metastatic disease. 2. Hiatal hernia    She was for a routine  pap smear by her PCP, Heide Scales, FNP on 07/05/15. This revealed atypical glandular  cells - endometrial. She was then seen by Dr Alfonse Spruce on 11/24/15 and reported a single episode of light pink spotting but otherwise no bleeding. 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 On 01/18/16 she underwent robotic assisted total hysterectomy, BSO, and SLN biopsy. Surgery was uncomplicated and there were no suspicious findings intraoperatively.   Final pathology revealed a stage IIIC1 endometrioid endometrial adenocarcinoma with a 3.3cm tumor invading <50% (1cm of 2.4cm) with no LVSI. However, bilateral obturator SLN's revealed micrometastases on both H&E and IHC.   Postoperative imaging (including CT abdo/pelvis and c  hest x ray) revealed no enlarged nodes, residual disease or chest disease.   The patient was recommended to received 6 cycles of adjuvant carboplatin and paclitaxel in accordance with NCCN guidelines. She has met with medical oncology, Dr Heath Lark, but declined adjuvant chemotherapy as she was concerned about immunity. She elected for an "anti-cancer" diet.   She was tested for Lynch syndrome (her tumor is MSI positive with loss of MLH1 expression), however, Lynch syndrome testing was negative.   In 2019 she reported + radicular pain down left leg. She has a known disc prolapse and is seeing an orthopedic surgeon. Repeat CT scan on 05/26/17 showed no evidence of recurrence including no adenopathy in retroperitoneal region, and no explanation for her pain.    She had had a persistent cough since October, 2019. CT chest in October, 2019 showed a tiny subpleural nodule in the anterior aspect of the left lower lobe abutting the major fissure. Annual follow-up was recommended, though it was suspected to be benign. CXR on 02/03/17 showed normal heart and lungs.    In 2020 she reported progressive low back pain, sciatic pain and neck pains. Due to her back pain a CT chest abdomen pelvis was  performed at [facility] on October 06, 2018. CT of the abdomen and pelvis revealed a 9 mm short axis right periaortic node that was new and suspicious for recurrent endometrial cancer.  There was no other gross abdominal or retroperitoneal recurrence.   She received pembrolizumab for salvage therapy between 03/15/19 and 07/20/19. She also received salvage radiation (50 Gy in 20 fractions to the PA nodal region) with Dr Sondra Come between 12/17/18 through 01/15/19.   Post-treatment imaging on 06/14/19 showed no evidence of recurrence/progression.   She developed cough/respiratory symptoms and was seen by pulmonology. It was not felt that her symptoms were secondary to [facility] (eg pneumonitis). The patient self-discontinued therapy in July, 2021 (plan had been for ongoing [place]) and did not return for follow-up for approximately 1 year.   CT abd/pelvis (surveillance) in June, 2022 was performed at [facility] and showed no evidence of persistent/recurrent disease.    Interval History: Patient presents today for follow-up visit.  Describes several months of symptoms including fatigue, intermittent nausea (only has some days, often feels like she is going to vomit but does not, occurs mostly in the morning), and change to stool.  She has history of IBS but notes that stool is thin, alternating between constipation and diarrhea, and different than her normal baseline.  Continues to suffer with low back pain which is at baseline and related to degenerative disc disease.  Denies new urinary symptoms, struggles with overactive bladder, at baseline.  Denies any vaginal eating or discharge.  Continues to have candidal infections underneath her pannus and  breasts.  Past Medical/Surgical History: Past Medical History:  Diagnosis Date   Acquired hypothyroidism 04/26/2019   Arthritis    Back pain 02/03/2017   Complication of anesthesia 01/10/2016   when [place] feels like lump in throat had endo for [place]  feeling continues   Coronary artery disease involving native coronary artery of native heart without angina pectoris 09/08/2014   DDD (degenerative disc disease), cervical 03/03/2014   Depression    Diverticulitis    Elevated liver enzymes 04/05/2019   Endometrial cancer (HCC)    Essential hypertension    Exposure to TB    latent takes rifadin for 4 months for started nov 2017   Family history of brain cancer    Family history of breast cancer    Family history of colon cancer    Genetic testing 05/01/2016   Negative genetic testing on the TumorNext Lynch with CancerNext.  This is paired germline and tumor analyses for enhanced diagnosis of lynch syndrome plus analyses of 29 additional genes associated with hereditary cancer.  The CancerNext gene panel offered by [employer] includes sequencing and rearrangement analysis for the following 34 genes:   APC, ATM, BARD1, BMPR1A, BRCA1, BRCA2, BRIP1, CD   GERD (gastroesophageal reflux disease)    Goals of care, counseling/discussion 03/02/2019   H. 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) 03/05/2019   Urinary incontinence     Past Surgical History:  Procedure Laterality Date   CHOLECYSTECTOMY  2000   COLECTOMY  2016   colonscopy     ROBOTIC ASSISTED TOTAL HYSTERECTOMY WITH BILATERAL SALPINGO OOPHERECTOMY Bilateral 01/18/2016   Procedure: XI ROBOTIC ASSISTED TOTAL HYSTERECTOMY WITH BILATERAL SALPINGO OOPHORECTOMY WITH SENTINAL LYMPH NODE BIOPSY; LYSIS OF ADHESIONS;  Surgeon:  Adolphus Birchwood, MD;  Location: [facility];  Service: Gynecology;  Laterality: Bilateral;   TUBAL LIGATION  1991   UPPER GI ENDOSCOPY  01/10/2016    Family History  Problem Relation Age of Onset   COPD Mother    CAD Father    Cancer Maternal Grandmother 56       breast ca   Cancer Maternal Aunt        lung ca   Cancer Maternal Uncle        brain ca   Pulmonary embolism Sister    Heart attack Brother    COPD Brother    Colon cancer Maternal Uncle    Brain cancer Cousin        dx under 50    Social History   Socioeconomic History   Marital status: Married    Spouse name: Richard   Number of children: 3   Years of education: Not on file   Highest education level: Not on file  Occupational History   Occupation: Non-working, applying disability  Tobacco Use   Smoking status: Former    Packs/day: 1.00    Years: 10.00    Pack years: 10.00    Types: Cigarettes    Quit date: 12/05/1988  Years since quitting: 32.1   Smokeless tobacco: Never  Vaping Use   Vaping Use: Never used  Substance and Sexual Activity   Alcohol use: No   Drug use: No   Sexual activity: Yes    Partners: Male  Other Topics Concern   Not on file  Social History Narrative   Taking care of her grandson who has autism, 1 years old   Social Determinants of Radio broadcast assistant Strain: Not on file  Food Insecurity: Not on file  Transportation Needs: Not on file  Physical Activity: Not on file  Stress: Not on file  Social Connections: Not on file    Current Medications:  Current Outpatient Medications:    albuterol (PROVENTIL) (2.5 MG/3ML) 0.083% nebulizer solution, , Disp: , Rfl:    Azelastine HCl 0.15 % SOLN, Place 1 spray into both nostrils daily., Disp: , Rfl:    dicyclomine (BENTYL) 10 MG capsule, Take 10 mg by mouth 2 (two) times daily as needed for spasms., Disp: , Rfl:    escitalopram (LEXAPRO) 20 MG tablet, Take 30 mg by mouth daily., Disp: , Rfl: 1   esomeprazole (NEXIUM) 40 MG  capsule, Take 40 mg by mouth 2 (two) times daily., Disp: , Rfl:    ketoconazole (NIZORAL) 2 % shampoo, ketoconazole 2 % shampoo  APPLY TO WASH TWICE WEEKLY, Disp: , Rfl:    levothyroxine (SYNTHROID) 112 MCG tablet, Take 112 mcg by mouth daily., Disp: , Rfl:    losartan (COZAAR) 50 MG tablet, Take 1 tablet by mouth daily., Disp: , Rfl:    MYRBETRIQ 25 MG TB24 tablet, Take 25 mg by mouth daily., Disp: , Rfl:    nystatin (MYCOSTATIN/NYSTOP) powder, Apply 1 application topically 3 (three) times daily., Disp: 15 g, Rfl: 1   QUEtiapine (SEROQUEL) 50 MG tablet, Take 50 mg by mouth daily., Disp: , Rfl:    rosuvastatin (CRESTOR) 40 MG tablet, Take 40 mg by mouth daily., Disp: , Rfl:    XHANCE 93 MCG/ACT EXHU, Place 1 spray into both nostrils 2 (two) times daily., Disp: , Rfl:    B Complex Vitamins (VITAMIN-B COMPLEX) TABS, Take 1 tablet by mouth daily.  (Patient not taking: Reported on 05/26/2020), Disp: , Rfl:    EPINEPHrine 0.3 mg/0.3 mL IJ SOAJ injection, Inject into the muscle as directed. Inject into the muscle as directed (Patient not taking: Reported on 01/03/2021), Disp: , Rfl:    famotidine (PEPCID) 40 MG tablet, TAKE 1 (ONE) TABLET TWICE DAILY, WITH BREAKFAST AND AT BEDTIME (Patient not taking: Reported on 09/28/2020), Disp: , Rfl:    fluticasone (FLONASE) 50 MCG/ACT nasal spray, INSTILL 2 SPRAYS INTO EACH NOSTRIL ONCE A DAY (Patient not taking: Reported on 05/26/2020), Disp: , Rfl: 0   nystatin cream (MYCOSTATIN), , Disp: , Rfl:    ondansetron (ZOFRAN) 8 MG tablet, Take 1 tablet (8 mg total) by mouth 2 (two) times daily as needed (Nausea or vomiting). (Patient not taking: Reported on 05/26/2020), Disp: 30 tablet, Rfl: 1   pravastatin (PRAVACHOL) 20 MG tablet, TAKE 1 TABLET BY MOUTH DAILY (Patient not taking: Reported on 05/26/2020), Disp: , Rfl:    pregabalin (LYRICA) 150 MG capsule, Take 150 mg by mouth 2 (two) times daily. (Patient not taking: Reported on 05/26/2020), Disp: , Rfl:    prochlorperazine  (COMPAZINE) 10 MG tablet, Take 1 tablet (10 mg total) by mouth every 6 (six) hours as needed for nausea or vomiting. (Patient not taking: Reported on 05/26/2020), Disp: 30 tablet,  Rfl: 0   solifenacin (VESICARE) 10 MG tablet, Vesicare 10 mg tablet  TAKE 1 TABLET BY MOUTH EVERY DAY (Patient not taking: Reported on 09/28/2020), Disp: , Rfl:   Review of Systems: Pertinent positives include fatigue, abdominal pain, back pain, nausea. Denies appetite changes, fevers, chills, unexplained weight changes. Denies hearing loss, neck lumps or masses, mouth sores, ringing in ears or voice changes. Denies cough or wheezing.  Denies shortness of breath. Denies chest pain or palpitations. Denies leg swelling. Denies abdominal distention, blood in stools, constipation, diarrhea, vomiting, or early satiety. Denies pain with intercourse, dysuria, frequency, hematuria or incontinence. Denies hot flashes, pelvic pain, vaginal bleeding or vaginal discharge.   Denies joint pain, or muscle pain/cramps. Denies itching, rash, or wounds. Denies dizziness, headaches, numbness or seizures. Denies swollen lymph nodes or glands, denies easy bruising or bleeding. Denies anxiety, depression, confusion, or decreased concentration.  Physical Exam: BP 115/75 (BP Location: Left Arm, Patient Position: Sitting)    Pulse 77    Temp (!) 97.2 F (36.2 C) (Tympanic)    Resp 16    Ht $R'5\' 6"'LG$  (1.676 m)    Wt 236 lb (107 kg)    SpO2 97%    BMI 38.09 kg/m  General: Alert, oriented, no acute distress. HEENT: Normocephalic, atraumatic, sclera anicteric. Chest: Clear to auscultation bilaterally.  No wheezes or rhonchi. Cardiovascular: Regular rate and rhythm, no murmurs. Abdomen: Obese, soft, nontender.  Normoactive bowel sounds.  No masses or hepatosplenomegaly appreciated.  Extremities: Grossly normal range of motion.  Warm, well perfused.  No edema bilaterally. Skin: No rashes or lesions noted. Lymphatics: No cervical, supraclavicular,  or inguinal adenopathy. GU: Normal appearing external genitalia without erythema, excoriation, or lesions.  Speculum exam reveals mild atrophy of the vaginal mucosa, no masses or lesions noted.  Bimanual exam reveals cuff intact, no nodularity or masses.  Rectovaginal exam confirms these findings.  Laboratory & Radiologic Studies: CT of the abdomen and pelvis from 06/16/2020: No acute findings.  No evidence of recurrent or metastatic cancer.  Colonic diverticulosis noted.  Stable moderate hiatal hernia.  Assessment & Plan: Allison Spencer is a 60 y.o. woman with recurrent grade 1 endometrioid endometrial cancer diagnosed in January, 2018, recurrence diagnosed 10/06/18. 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  S/p salvage therapy with pembrolizumab and radiation to para-aortic nodes.  Complete clinical and radiographic response, completed in mid 2021.  Patient has multiple symptoms today that are concerning for possible cancer recurrence.  She had a CT scan that was negative in June, I recommend that we repeat this.  She prefers to have this at [place] [facility].  Order will be faxed to them.  We will plan to get a BMP today to assure creatinine normal.  Patient has a history of GERD as well as IBS and a hiatal hernia.  She has multiple symptoms that may be related to her other GI issues.  I recommended, especially if her CT scan is negative for recurrent disease, for her to reach out to her gastroenterologist.  From a cancer surveillance standpoint, we will continue with surveillance visits every 3 months until mid 2023 and then transition to visits every 6 months.  I will call her once I have results from her CT scan.  Refill sent for nystatin powder.  38 minutes of total time was spent for this patient encounter, including preparation, face-to-face counseling with the  patient and coordination of care, and documentation of the encounter.  Jeral Pinch, MD  Division of Gynecologic Oncology  Department of Obstetrics and Gynecology  [facility]

## 2021-01-03 ENCOUNTER — Encounter: Payer: Self-pay | Admitting: Gynecologic Oncology

## 2021-01-03 ENCOUNTER — Inpatient Hospital Stay: Payer: Medicare Other

## 2021-01-03 ENCOUNTER — Inpatient Hospital Stay: Payer: Medicare Other | Attending: Gynecologic Oncology | Admitting: Gynecologic Oncology

## 2021-01-03 ENCOUNTER — Other Ambulatory Visit: Payer: Self-pay

## 2021-01-03 VITALS — BP 115/75 | HR 77 | Temp 97.2°F | Resp 16 | Ht 66.0 in | Wt 236.0 lb

## 2021-01-03 DIAGNOSIS — Z90710 Acquired absence of both cervix and uterus: Secondary | ICD-10-CM | POA: Diagnosis not present

## 2021-01-03 DIAGNOSIS — I2510 Atherosclerotic heart disease of native coronary artery without angina pectoris: Secondary | ICD-10-CM | POA: Insufficient documentation

## 2021-01-03 DIAGNOSIS — K449 Diaphragmatic hernia without obstruction or gangrene: Secondary | ICD-10-CM | POA: Insufficient documentation

## 2021-01-03 DIAGNOSIS — E039 Hypothyroidism, unspecified: Secondary | ICD-10-CM | POA: Diagnosis not present

## 2021-01-03 DIAGNOSIS — C541 Malignant neoplasm of endometrium: Secondary | ICD-10-CM

## 2021-01-03 DIAGNOSIS — F32A Depression, unspecified: Secondary | ICD-10-CM | POA: Insufficient documentation

## 2021-01-03 DIAGNOSIS — K219 Gastro-esophageal reflux disease without esophagitis: Secondary | ICD-10-CM | POA: Insufficient documentation

## 2021-01-03 DIAGNOSIS — I10 Essential (primary) hypertension: Secondary | ICD-10-CM | POA: Insufficient documentation

## 2021-01-03 DIAGNOSIS — Z9221 Personal history of antineoplastic chemotherapy: Secondary | ICD-10-CM | POA: Insufficient documentation

## 2021-01-03 DIAGNOSIS — R112 Nausea with vomiting, unspecified: Secondary | ICD-10-CM | POA: Diagnosis not present

## 2021-01-03 DIAGNOSIS — Z8542 Personal history of malignant neoplasm of other parts of uterus: Secondary | ICD-10-CM

## 2021-01-03 DIAGNOSIS — E785 Hyperlipidemia, unspecified: Secondary | ICD-10-CM | POA: Insufficient documentation

## 2021-01-03 DIAGNOSIS — B372 Candidiasis of skin and nail: Secondary | ICD-10-CM | POA: Insufficient documentation

## 2021-01-03 DIAGNOSIS — Z90722 Acquired absence of ovaries, bilateral: Secondary | ICD-10-CM | POA: Diagnosis not present

## 2021-01-03 DIAGNOSIS — Z79899 Other long term (current) drug therapy: Secondary | ICD-10-CM | POA: Diagnosis not present

## 2021-01-03 DIAGNOSIS — Z923 Personal history of irradiation: Secondary | ICD-10-CM | POA: Diagnosis not present

## 2021-01-03 DIAGNOSIS — Z87891 Personal history of nicotine dependence: Secondary | ICD-10-CM | POA: Diagnosis not present

## 2021-01-03 DIAGNOSIS — B379 Candidiasis, unspecified: Secondary | ICD-10-CM

## 2021-01-03 DIAGNOSIS — G4733 Obstructive sleep apnea (adult) (pediatric): Secondary | ICD-10-CM | POA: Diagnosis not present

## 2021-01-03 DIAGNOSIS — R110 Nausea: Secondary | ICD-10-CM

## 2021-01-03 LAB — BASIC METABOLIC PANEL - CANCER CENTER ONLY
Anion gap: 6 (ref 5–15)
BUN: 15 mg/dL (ref 8–23)
CO2: 25 mmol/L (ref 22–32)
Calcium: 8.8 mg/dL — ABNORMAL LOW (ref 8.9–10.3)
Chloride: 108 mmol/L (ref 98–111)
Creatinine: 0.82 mg/dL (ref 0.44–1.00)
GFR, Estimated: >60 mL/min (ref >60)
Glucose, Bld: 100 mg/dL — ABNORMAL HIGH (ref 70–99)
Potassium: 4 mmol/L (ref 3.5–5.1)
Sodium: 139 mmol/L (ref 135–145)

## 2021-01-03 MED ORDER — NYSTATIN 100000 UNIT/GM EX POWD: 1.0000 "application " | Freq: Three times a day (TID) | CUTANEOUS | 1 refills | Status: DC

## 2021-01-03 NOTE — Patient Instructions (Addendum)
We will work to get your CT scheduled at [place].  Since this is outside of our system, our office will call you once we have it scheduled.  We will get a chemistry panel today so that we know your kidney function before the CT scan.  I will call you when I get these results.  We will tentatively schedule you for a follow-up with me in 3 months.  Please call if anything changes before that.

## 2021-01-05 ENCOUNTER — Telehealth: Payer: Self-pay

## 2021-01-05 NOTE — Telephone Encounter (Signed)
Patient called to see when CT scan was scheduled for.  She said someone in the office was going to schedule it for her and call her of the date.  Informed her Allison Spencer is out of the office for some time today and we will return her call by the end of the day.

## 2021-01-15 ENCOUNTER — Telehealth: Payer: Self-pay

## 2021-01-15 NOTE — Telephone Encounter (Signed)
Attempted to reach patient to discuss CT scan results. Unable to contact patient. Left message requesting return call.

## 2021-01-16 ENCOUNTER — Encounter: Payer: Self-pay | Admitting: Hematology and Oncology

## 2021-01-16 NOTE — Telephone Encounter (Signed)
Received call from Allison Spencer this morning to review recent CT findings. Per Allison John, NP no evidence of metastatic disease within the abdomen or pelvis. Diverticulosis noted on scan with no diverticulitis. Moderate size hiatal hernia noted. Patient verbalizes understanding. She states she is aware of diverticulosis and hernia, she has had a prior bowel resection. Patient to follow up with GI doctor for symptoms. Patient states she is working on scheduling a colonoscopy and endoscopy but is waiting on clearance from her pulmonary doctor. A copy of the CT report will be sent to patients GI doctor (Dr. Melina Copa in [place]).  Instructed patient to call with any needs.

## 2021-01-25 ENCOUNTER — Encounter: Payer: Self-pay | Admitting: Hematology and Oncology

## 2021-03-30 ENCOUNTER — Encounter: Payer: Self-pay | Admitting: Gynecologic Oncology

## 2021-04-05 ENCOUNTER — Ambulatory Visit: Payer: Medicare Other | Admitting: Gynecologic Oncology

## 2021-04-05 DIAGNOSIS — C541 Malignant neoplasm of endometrium: Secondary | ICD-10-CM

## 2021-04-11 ENCOUNTER — Encounter: Payer: Self-pay | Admitting: Gynecologic Oncology

## 2021-04-12 ENCOUNTER — Inpatient Hospital Stay: Payer: Medicare Other | Attending: Gynecologic Oncology | Admitting: Gynecologic Oncology

## 2021-04-12 ENCOUNTER — Encounter: Payer: Self-pay | Admitting: Gynecologic Oncology

## 2021-04-12 ENCOUNTER — Inpatient Hospital Stay: Payer: Medicare Other

## 2021-04-12 ENCOUNTER — Other Ambulatory Visit: Payer: Self-pay

## 2021-04-12 VITALS — BP 127/77 | HR 73 | Temp 98.8°F | Resp 16 | Ht 66.0 in | Wt 216.0 lb

## 2021-04-12 DIAGNOSIS — Z9221 Personal history of antineoplastic chemotherapy: Secondary | ICD-10-CM | POA: Insufficient documentation

## 2021-04-12 DIAGNOSIS — R399 Unspecified symptoms and signs involving the genitourinary system: Secondary | ICD-10-CM

## 2021-04-12 DIAGNOSIS — M545 Low back pain, unspecified: Secondary | ICD-10-CM | POA: Diagnosis not present

## 2021-04-12 DIAGNOSIS — Z803 Family history of malignant neoplasm of breast: Secondary | ICD-10-CM | POA: Diagnosis not present

## 2021-04-12 DIAGNOSIS — Z923 Personal history of irradiation: Secondary | ICD-10-CM | POA: Insufficient documentation

## 2021-04-12 DIAGNOSIS — Z87891 Personal history of nicotine dependence: Secondary | ICD-10-CM | POA: Diagnosis not present

## 2021-04-12 DIAGNOSIS — Z79899 Other long term (current) drug therapy: Secondary | ICD-10-CM | POA: Insufficient documentation

## 2021-04-12 DIAGNOSIS — Z800 Family history of malignant neoplasm of digestive organs: Secondary | ICD-10-CM | POA: Diagnosis not present

## 2021-04-12 DIAGNOSIS — N3281 Overactive bladder: Secondary | ICD-10-CM | POA: Diagnosis not present

## 2021-04-12 DIAGNOSIS — Z8542 Personal history of malignant neoplasm of other parts of uterus: Secondary | ICD-10-CM | POA: Diagnosis present

## 2021-04-12 DIAGNOSIS — Z808 Family history of malignant neoplasm of other organs or systems: Secondary | ICD-10-CM | POA: Insufficient documentation

## 2021-04-12 DIAGNOSIS — Z801 Family history of malignant neoplasm of trachea, bronchus and lung: Secondary | ICD-10-CM | POA: Insufficient documentation

## 2021-04-12 DIAGNOSIS — C541 Malignant neoplasm of endometrium: Secondary | ICD-10-CM

## 2021-04-12 LAB — URINALYSIS, COMPLETE (UACMP) WITH MICROSCOPIC
Bilirubin Urine: NEGATIVE
Glucose, UA: NEGATIVE mg/dL
Hgb urine dipstick: NEGATIVE
Ketones, ur: NEGATIVE mg/dL
Nitrite: NEGATIVE
Protein, ur: NEGATIVE mg/dL
Specific Gravity, Urine: 1.009 (ref 1.005–1.030)
pH: 6 (ref 5.0–8.0)

## 2021-04-12 NOTE — Progress Notes (Signed)
Gynecologic Oncology Return Clinic Visit ? ?04/12/2021 ? ?Reason for Visit: surveillance visit in the setting of endometrial cancer ? ?Treatment History: ?Oncology History Overview Note  ?MSI high disease detected ?  ?Endometrial cancer [facility])  ?07/05/2015 Pathology Results  ? She had abnormal PAP ?  ?11/24/2015 Procedure  ? She underwent endometrial sampling that came back abnormal ?  ?01/10/2016 Procedure  ? She had EGD which showed esophagitis ?  ?01/18/2016 Pathology Results  ? Diagnosis ?1. Lymph node, sentinel, biopsy, right obturator ?- MICROMETASTASIS IN ONE OF ONE LYMPH NODES (1/1). ?2. Lymph node, sentinel, biopsy, left external ?- ONE OF ONE LYMPH NODES NEGATIVE FOR CARCINOMA (0/1). ?3. Lymph node, sentinel, biopsy, left obturator ?- MICROMETASTASIS IN ONE OF ONE LYMPH NODES (1/1). ?4. 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 ?TUMOR INVADES LESS THAN 1/2 OF THE MYOMETRIUM. ?SEE ONCOLOGY TABLE. ?-SEROSA: UNREMARKABLE. NO MALIGNANCY IDENTIFIED. ?- CERVIX: BENIGN SQUAMOUS AND ENDOCERVICAL MUCOSA. NO DYSPLASIA OR MALIGNANCY. ?- BILATERAL OVARIES: INCLUSION CYSTS. NO MALIGNANCY. ?- BILATERAL FALLOPIAN TUBES: UNREMARKABLE RIGHT TUBE. ?LEFT TUBE IS NOT IDENTIFIED GROSSLY OR MICROSCOPICALLY. ?  ?01/18/2016 Surgery  ? She underwent robotic-assisted laparoscopic total hysterectomy with bilateral salpingoophorectomy, sentinel lymph node biopsy, lysis of adhesions ?  ?04/24/2016 Genetic Testing  ? Negative genetic testing on the TumorNext Lynch with CancerNext.  This is paired germline and tumor analyses for enhanced diagnosis of lynch syndrome plus analyses of 29 additional genes associated with hereditary cancer.  The CancerNext gene panel offered by [employer] includes sequencing and rearrangement analysis for the following 34 genes:   APC, ATM, BARD1, BMPR1A, BRCA1, BRCA2, BRIP1, CDH1, CDK4, CDKN2A, CHEK2, DICER, HOXB13, EPCAM, GREM1,  MLH1, MRE11A, MSH2, MSH6, MUTYH, NBN, NF1, PALB2, PMS2, POLD1, POLE, PTEN, RAD50, RAD51C, RAD51D, SMAD4, SMARCA4, STK11, and TP53.  ? 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  Taken with the germline results demonstrating absence of pathogenic mutations or likely pathogenic variants (VLPs) in the mismatch repair (MMR) genes, the likelihood that this individual has Lynch syndrome/HNPCC is greatly decreased; however, the possibility of an undetected variant of either germline or somatic origin due to mosaicism and other rare etiologies cannot be ruled out by the current methodology.  Correlation with clinical history and external tumor testing results is advised. ? ?  ?10/29/2018 PET scan  ? Outside PET scan showed enlarged aorta-caval LN ?  ?12/17/2018 - 01/15/2019 Radiation Therapy  ? Radiation Treatment Dates: 12/17/2018 through 01/15/2019 ?Site Technique Total Dose (Gy) Dose per Fx (Gy) Completed Fx Beam Energies  ?Abdomen: Abd_PA node 3D 50/50 2.5 20/20 6X, 15X  ?  ?  ?  ?03/01/2019 Imaging  ? 1. No CT findings for metastatic disease involving the chest, abdomen or pelvis. ?2. Stable moderate-sized hiatal hernia. ?3. Status post cholecystectomy. No biliary dilatation. ?4. Stable hepatic cysts. ?  ?03/15/2019 -  Chemotherapy  ? The patient had pembrolizumab (KEYTRUDA) 200 mg in sodium chloride 0.9 % 50 mL chemo infusion, 200 mg, Intravenous, Once, 6 of 7 cycles ?Dose modification: 400 mg (original dose 200 mg, Cycle 6, Reason: Other (see comments)) ?Administration: 200 mg (03/15/2019), 200 mg (04/26/2019), 200 mg (05/25/2019), 400 mg (07/20/2019), 200 mg (04/05/2019), 200 mg (06/15/2019) ? ? for chemotherapy treatment.  ? ?  ?06/14/2019 Imaging  ? 1. No acute findings within the abdomen or pelvis. No findings of recurrent tumor or metastatic disease. ?2. Hiatal hernia ?  ? ?She was for a routine  pap smear by her PCP, Heide Scales, FNP on 07/05/15. This revealed atypical glandular  cells - endometrial. She was then seen by Dr Alfonse Spruce on 11/24/15 and reported a single episode of light pink spotting but otherwise no bleeding. ?On 11/24/15 Dr Alfonse Spruce performed an endometrial biopsy which revealed well differentiated (FIGO grade 1) endometrioid endometrial cancer. ?On 01/18/16 she underwent robotic assisted total hysterectomy, BSO, and SLN biopsy. Surgery was uncomplicated and there were no suspicious findings intraoperatively. ?  ?Final pathology revealed a stage IIIC1 endometrioid endometrial adenocarcinoma with a 3.3cm tumor invading <50% (1cm of 2.4cm) with no LVSI. However, bilateral obturator SLN's revealed micrometastases on both H&E and IHC. ?  ?Postoperative imaging (including CT abdo/pelvis and c  hest x ray) revealed no enlarged nodes, residual disease or chest disease. ?  ?The patient was recommended to received 6 cycles of adjuvant carboplatin and paclitaxel in accordance with NCCN guidelines. She has met with medical oncology, Dr Heath Lark, but declined adjuvant chemotherapy as she was concerned about immunity. She elected for an "anti-cancer" diet. ?  ?She was tested for Lynch syndrome (her tumor is MSI positive with loss of MLH1 expression), however, Lynch syndrome testing was negative. ?  ?In 2019 she reported + radicular pain down left leg. She has a known disc prolapse and is seeing an orthopedic surgeon. ?Repeat CT scan on 05/26/17 showed no evidence of recurrence including no adenopathy in retroperitoneal region, and no explanation for her pain.  ?  ?She had had a persistent cough since October, 2019. CT chest in October, 2019 showed a tiny subpleural nodule in the anterior aspect of the left lower lobe abutting the major fissure. Annual follow-up was recommended, though it was suspected to be benign. CXR on 02/03/17 showed normal heart and lungs.  ?  ?In 2020 she reported progressive low back pain, sciatic pain and neck pains. Due to her back pain a CT chest abdomen pelvis was  performed at [facility] on October 06, 2018. CT of the abdomen and pelvis revealed a 9 mm short axis right periaortic node that was new and suspicious for recurrent endometrial cancer.  There was no other gross abdominal or retroperitoneal recurrence. ?  ?She received pembrolizumab for salvage therapy between 03/15/19 and 07/20/19. ?She also received salvage radiation (50 Gy in 20 fractions to the PA nodal region) with Dr Sondra Come between 12/17/18 through 01/15/19. ?  ?Post-treatment imaging on 06/14/19 showed no evidence of recurrence/progression. ?  ?She developed cough/respiratory symptoms and was seen by pulmonology. It was not felt that her symptoms were secondary to [facility] (eg pneumonitis). The patient self-discontinued therapy in July, 2021 (plan had been for ongoing [place]) and did not return for follow-up for approximately 1 year. ?  ?CT abd/pelvis (surveillance) in June, 2022 was performed at [facility] and showed no evidence of persistent/recurrent disease.   ? ?Interval History: ?Patient reports overall doing well.  She denies any vaginal bleeding or discharge.  Bowel function is at baseline, she struggles with IBS.  She has been working on weight loss, has been able to achieve about 20 pounds of weight loss since December.  This has been by cutting out carbs and exercising.  She tries to walk about a mile a day.  She is feeling better with the weight loss. ? ?Earlier this week, she began having some right lower back pain as well as feeling like she is not emptying her bladder all the way.  She denies any dysuria or hematuria.  Denies any  fevers or chills.  Other than walking more frequently, denies any activity or strain to her back. ? ?Past Medical/Surgical History: ?Past Medical History:  ?Diagnosis Date  ? Acquired hypothyroidism 04/26/2019  ? Arthritis   ? Back pain 02/03/2017  ? Complication of anesthesia 01/10/2016  ? when [place] feels like lump in throat had endo for [place] feeling continues  ?  Coronary artery disease involving native coronary artery of native heart without angina pectoris 09/08/2014  ? DDD (degenerative disc disease), cervical 03/03/2014  ? Depression   ? Diverticulitis   ? [place]

## 2021-04-12 NOTE — Patient Instructions (Signed)
It was good to see you today.  We will let you know if it looks like you have a urine infection. ? ?I have sent a referral to get you scheduled with our urogynecologist since your urologist left the practice in [place]. ? ?I will plan to see you back in 3 months.  If you develop any new and concerning symptoms before then, please call to see me sooner. ?

## 2021-04-13 ENCOUNTER — Telehealth: Payer: Self-pay | Admitting: *Deleted

## 2021-04-13 NOTE — Telephone Encounter (Signed)
Reviewed results with pt. Per Allison John, NP urine is showing some leukocytes and bacteria. Recommends waiting for culture before prescribing any medications. Pt verbalized understanding.  ?

## 2021-04-13 NOTE — Telephone Encounter (Signed)
Attempted to call pt with results of urine but unable to reach her. LVM for return call.  ?

## 2021-04-14 LAB — URINE CULTURE

## 2021-04-16 ENCOUNTER — Telehealth: Payer: Self-pay | Admitting: *Deleted

## 2021-04-16 ENCOUNTER — Other Ambulatory Visit: Payer: Self-pay | Admitting: Gynecologic Oncology

## 2021-04-16 DIAGNOSIS — M545 Low back pain, unspecified: Secondary | ICD-10-CM

## 2021-04-16 DIAGNOSIS — C541 Malignant neoplasm of endometrium: Secondary | ICD-10-CM

## 2021-04-16 DIAGNOSIS — R399 Unspecified symptoms and signs involving the genitourinary system: Secondary | ICD-10-CM

## 2021-04-16 NOTE — Telephone Encounter (Signed)
Spoke with pt on the phone this afternoon to assess urinary symptoms. Pt stated that she has lower back pain of an 8/10. When she takes tylenol it brings the pain down to a 6-7/10. Suggestion of a ice or heat pack on the lower back may help. Pt stated she will try it. Pt denies pain or burning in the vaginal area, foul odor or discharges as well. She denies fevers but said she's had some chills this past weekend. When asked if she has taken her temperature she stated she has not but she knows that she has not had any fevers. Pt stated she does have some urinary urgency every couple of hours. She feels like she doesn't completely empty her bladder all the way and she also has a leakage problem as well. But she has been dealing with the leakage problem for a while now. Her urologist quit so she's in search of a new one. Dr.Tucker informed.   ?

## 2021-04-17 NOTE — Telephone Encounter (Signed)
Returning call to patient. Unable to contact her, left message requesting return call.  ?

## 2021-04-17 NOTE — Telephone Encounter (Signed)
Spoke with Allison Spencer this afternoon. Advised patient that she needs to provide another urine sample. Patient unable to come to clinic for a urine sample on Wednesday 4/12. Appointment scheduled for Thursday 04/19/21 at 11am. Patient verbalizes understanding and is in agreement of appointment. Advised to call with any needs.  ?

## 2021-04-17 NOTE — Telephone Encounter (Signed)
Attempted to follow up with patient and arrange lab appointment to check her urine. Unable to contact patient, left message requesting return call.  ?

## 2021-04-19 ENCOUNTER — Inpatient Hospital Stay: Payer: Medicare Other

## 2021-04-19 ENCOUNTER — Other Ambulatory Visit: Payer: Self-pay | Admitting: Gynecologic Oncology

## 2021-04-19 ENCOUNTER — Telehealth: Payer: Self-pay

## 2021-04-19 ENCOUNTER — Other Ambulatory Visit: Payer: Self-pay

## 2021-04-19 DIAGNOSIS — R399 Unspecified symptoms and signs involving the genitourinary system: Secondary | ICD-10-CM

## 2021-04-19 DIAGNOSIS — Z8542 Personal history of malignant neoplasm of other parts of uterus: Secondary | ICD-10-CM | POA: Diagnosis not present

## 2021-04-19 DIAGNOSIS — R109 Unspecified abdominal pain: Secondary | ICD-10-CM

## 2021-04-19 DIAGNOSIS — R300 Dysuria: Secondary | ICD-10-CM

## 2021-04-19 DIAGNOSIS — M545 Low back pain, unspecified: Secondary | ICD-10-CM

## 2021-04-19 MED ORDER — NITROFURANTOIN MONOHYD MACRO 100 MG PO CAPS: 100.0000 mg | ORAL_CAPSULE | Freq: Two times a day (BID) | ORAL | 0 refills | Status: DC

## 2021-04-19 NOTE — Progress Notes (Signed)
See RN note. A new urine sample has been collected to re-test for culture. Per Dr. Berline Lopes, plan to go ahead and start antibiotic treatment since patient is moderately symptomatic.  ?

## 2021-04-19 NOTE — Telephone Encounter (Signed)
Spoke with pt this afternoon who stated that she's still having back pain of 8/10. She uses the heat pack and massaging recliner with no alleviation of the pain. She's also experiencing right hip pain that shoots down to the top of the right thigh.  She does feel like she's not emptying her bladder completely. Informed pt that we will send in an antibiotic to her pharmacy and once her repeat urine results we will call her with the results. Pt verbalized understanding.   ?

## 2021-04-19 NOTE — Telephone Encounter (Signed)
Attempted to call and follow up with Ms. Wuertz regarding her urinary symptoms. Unable to contact patient. Left voicemail requesting return call.  ?

## 2021-04-20 LAB — URINE CULTURE: Culture: <10000 — AB

## 2021-04-23 ENCOUNTER — Telehealth: Payer: Self-pay | Admitting: *Deleted

## 2021-04-23 NOTE — Telephone Encounter (Signed)
Attempted to reach pt this afternoon. Unable to leave a voice message on her cell phone. I was able to speak with her husband and asked him if he could let her know Allison Spencer from Dr.Tucker's office called to check up on her and if she could give [place] a call back. He verbalized understanding and stated he would deliver the message.  ?

## 2021-04-23 NOTE — Telephone Encounter (Signed)
Attempted to call pt on her home phone and cell phone to check up on her symptoms. Unable to reach pt. Unable to reach pt. LVM for call back.  ?

## 2021-04-24 NOTE — Telephone Encounter (Signed)
Spoke with pt this morning who stated that she feels a little better since starting the antibiotics. She states the back pain has improved to a 5/10, she empties her bladder almost completely when she urinates, and the urgency and frequency has improved. She takes tylenol for the back pain. She denies any other complaints or questions.  ?

## 2021-07-13 ENCOUNTER — Encounter: Payer: Self-pay | Admitting: Gynecologic Oncology

## 2021-07-16 ENCOUNTER — Inpatient Hospital Stay: Payer: Medicare Other

## 2021-07-16 ENCOUNTER — Encounter: Payer: Self-pay | Admitting: Gynecologic Oncology

## 2021-07-16 ENCOUNTER — Telehealth: Payer: Self-pay

## 2021-07-16 ENCOUNTER — Other Ambulatory Visit: Payer: Self-pay

## 2021-07-16 ENCOUNTER — Inpatient Hospital Stay: Payer: Medicare Other | Attending: Gynecologic Oncology | Admitting: Gynecologic Oncology

## 2021-07-16 VITALS — BP 129/84 | HR 77 | Temp 98.4°F | Resp 16 | Ht 66.0 in | Wt 222.1 lb

## 2021-07-16 DIAGNOSIS — C541 Malignant neoplasm of endometrium: Secondary | ICD-10-CM

## 2021-07-16 DIAGNOSIS — Z9221 Personal history of antineoplastic chemotherapy: Secondary | ICD-10-CM | POA: Diagnosis not present

## 2021-07-16 DIAGNOSIS — R32 Unspecified urinary incontinence: Secondary | ICD-10-CM | POA: Diagnosis not present

## 2021-07-16 DIAGNOSIS — M5440 Lumbago with sciatica, unspecified side: Secondary | ICD-10-CM | POA: Insufficient documentation

## 2021-07-16 DIAGNOSIS — Z923 Personal history of irradiation: Secondary | ICD-10-CM | POA: Insufficient documentation

## 2021-07-16 DIAGNOSIS — Z90710 Acquired absence of both cervix and uterus: Secondary | ICD-10-CM | POA: Diagnosis not present

## 2021-07-16 DIAGNOSIS — Z90722 Acquired absence of ovaries, bilateral: Secondary | ICD-10-CM | POA: Insufficient documentation

## 2021-07-16 DIAGNOSIS — Z8542 Personal history of malignant neoplasm of other parts of uterus: Secondary | ICD-10-CM | POA: Insufficient documentation

## 2021-07-16 DIAGNOSIS — M545 Low back pain, unspecified: Secondary | ICD-10-CM | POA: Diagnosis not present

## 2021-07-16 DIAGNOSIS — G8929 Other chronic pain: Secondary | ICD-10-CM | POA: Insufficient documentation

## 2021-07-16 LAB — BASIC METABOLIC PANEL - CANCER CENTER ONLY
Anion gap: 5 (ref 5–15)
BUN: 11 mg/dL (ref 8–23)
CO2: 29 mmol/L (ref 22–32)
Calcium: 9.3 mg/dL (ref 8.9–10.3)
Chloride: 106 mmol/L (ref 98–111)
Creatinine: 0.84 mg/dL (ref 0.44–1.00)
GFR, Estimated: >60 mL/min (ref >60)
Glucose, Bld: 108 mg/dL — ABNORMAL HIGH (ref 70–99)
Potassium: 4.6 mmol/L (ref 3.5–5.1)
Sodium: 140 mmol/L (ref 135–145)

## 2021-07-16 NOTE — Telephone Encounter (Signed)
Told Ms Crum that her metabolic panel is normal. Her blood sugar is slightly elevated at 108 but this is not a fasting lab per Joylene John, NP. Pt verbalized understanding.

## 2021-07-16 NOTE — Patient Instructions (Addendum)
It was good to see you today.  I do not see or feel any evidence of cancer recurrence on your exam.  Given your back pain, we will get a CT scan. I will call you with these results.   You are 2 years past completing treatment for your recurrence, we will transition to visits every 6 months as long as the CT is negative for cancer recurrence. Please call sometime at the end of the year or just after the new year to schedule a visit to see me in mid January 2024.  As always, if you develop new and concerning symptoms before your next visit, please call to see me sooner.  Please let me know if you are unable to get refill from your primary care physician for the Myrbetriq .

## 2021-07-16 NOTE — Progress Notes (Signed)
Gynecologic Oncology Return Clinic Visit  07/16/21  Reason for Visit: surveillance visit in the setting of endometrial cancer  Treatment History: Oncology History Overview Note  MSI high disease detected   Endometrial cancer ([place])  07/05/2015 Pathology Results   She had abnormal PAP   11/24/2015 Procedure   She underwent endometrial sampling that came back abnormal   01/10/2016 Procedure   She had EGD which showed esophagitis   01/18/2016 Pathology Results   Diagnosis 1. Lymph node, sentinel, biopsy, right obturator - MICROMETASTASIS IN ONE OF ONE LYMPH NODES (1/1). 2. Lymph node, sentinel, biopsy, left external - ONE OF ONE LYMPH NODES NEGATIVE FOR CARCINOMA (0/1). 3. Lymph node, sentinel, biopsy, left obturator - MICROMETASTASIS IN ONE OF ONE LYMPH NODES (1/1). 4. Uterus +/- tubes/ovaries, neoplastic - UTERUS: -ENDOMYOMETRIUM: ENDOMETRIOID ADENOCARCINOMA, FIGO GRADE 1, SPANNING 3.3 CM. TUMOR INVADES LESS THAN 1/2 OF THE MYOMETRIUM. SEE ONCOLOGY TABLE. -SEROSA: UNREMARKABLE. NO MALIGNANCY IDENTIFIED. - CERVIX: BENIGN SQUAMOUS AND ENDOCERVICAL MUCOSA. NO DYSPLASIA OR MALIGNANCY. - BILATERAL OVARIES: INCLUSION CYSTS. NO MALIGNANCY. - BILATERAL FALLOPIAN TUBES: UNREMARKABLE RIGHT TUBE. LEFT TUBE IS NOT IDENTIFIED GROSSLY OR MICROSCOPICALLY.   01/18/2016 Surgery   She underwent robotic-assisted laparoscopic total hysterectomy with bilateral salpingoophorectomy, sentinel lymph node biopsy, lysis of adhesions   04/24/2016 Genetic Testing   Negative genetic testing on the TumorNext Lynch with CancerNext.  This is paired germline and tumor analyses for enhanced diagnosis of lynch syndrome plus analyses of 29 additional genes associated with hereditary cancer.  The CancerNext gene panel offered by [employer] includes sequencing and rearrangement analysis for the following 34 genes:   APC, ATM, BARD1, BMPR1A, BRCA1, BRCA2, BRIP1, CDH1, CDK4, CDKN2A, CHEK2, DICER, HOXB13, EPCAM, GREM1,  MLH1, MRE11A, MSH2, MSH6, MUTYH, NBN, NF1, PALB2, PMS2, POLD1, POLE, PTEN, RAD50, RAD51C, RAD51D, SMAD4, SMARCA4, STK11, and TP53.   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  Taken with the germline results demonstrating absence of pathogenic mutations or likely pathogenic variants (VLPs) in the mismatch repair (MMR) genes, the likelihood that this individual has Lynch syndrome/HNPCC is greatly decreased; however, the possibility of an undetected variant of either germline or somatic origin due to mosaicism and other rare etiologies cannot be ruled out by the current methodology.  Correlation with clinical history and external tumor testing results is advised.    10/29/2018 PET scan   Outside PET scan showed enlarged aorta-caval LN   12/17/2018 - 01/15/2019 Radiation Therapy   Radiation Treatment Dates: 12/17/2018 through 01/15/2019 Site Technique Total Dose (Gy) Dose per Fx (Gy) Completed Fx Beam Energies  Abdomen: Abd_PA node 3D 50/50 2.5 20/20 6X, 15X        03/01/2019 Imaging   1. No CT findings for metastatic disease involving the chest, abdomen or pelvis. 2. Stable moderate-sized hiatal hernia. 3. Status post cholecystectomy. No biliary dilatation. 4. Stable hepatic cysts.   03/15/2019 - 07/20/2019 Chemotherapy   Patient is on Treatment Plan : UTERINE Pembrolizumab q21d     06/14/2019 Imaging   1. No acute findings within the abdomen or pelvis. No findings of recurrent tumor or metastatic disease. 2. Hiatal hernia    She was for a routine pap smear by her PCP, Heide Scales, FNP on 07/05/15. This revealed atypical glandular cells - endometrial. She was then seen by Dr Alfonse Spruce on 11/24/15 and reported a single episode of light pink spotting but otherwise no bleeding. On 11/24/15 Dr Alfonse Spruce performed an endometrial biopsy which revealed well differentiated (FIGO grade  1) endometrioid endometrial cancer. On 01/18/16 she underwent robotic assisted  total hysterectomy, BSO, and SLN biopsy. Surgery was uncomplicated and there were no suspicious findings intraoperatively.   Final pathology revealed a stage IIIC1 endometrioid endometrial adenocarcinoma with a 3.3cm tumor invading <50% (1cm of 2.4cm) with no LVSI. However, bilateral obturator SLN's revealed micrometastases on both H&E and IHC.   Postoperative imaging (including CT abdo/pelvis and c  hest x ray) revealed no enlarged nodes, residual disease or chest disease.   The patient was recommended to received 6 cycles of adjuvant carboplatin and paclitaxel in accordance with NCCN guidelines. She has met with medical oncology, Dr Heath Lark, but declined adjuvant chemotherapy as she was concerned about immunity. She elected for an "anti-cancer" diet.   She was tested for Lynch syndrome (her tumor is MSI positive with loss of MLH1 expression), however, Lynch syndrome testing was negative.   In 2019 she reported + radicular pain down left leg. She has a known disc prolapse and is seeing an orthopedic surgeon. Repeat CT scan on 05/26/17 showed no evidence of recurrence including no adenopathy in retroperitoneal region, and no explanation for her pain.    She had had a persistent cough since October, 2019. CT chest in October, 2019 showed a tiny subpleural nodule in the anterior aspect of the left lower lobe abutting the major fissure. Annual follow-up was recommended, though it was suspected to be benign. CXR on 02/03/17 showed normal heart and lungs.    In 2020 she reported progressive low back pain, sciatic pain and neck pains. Due to her back pain a CT chest abdomen pelvis was performed at [facility] on October 06, 2018. CT of the abdomen and pelvis revealed a 9 mm short axis right periaortic node that was new and suspicious for recurrent endometrial cancer.  There was no other gross abdominal or retroperitoneal recurrence.   She received pembrolizumab for salvage therapy between 03/15/19  and 07/20/19. She also received salvage radiation (50 Gy in 20 fractions to the PA nodal region) with Dr Sondra Come between 12/17/18 through 01/15/19.   Post-treatment imaging on 06/14/19 showed no evidence of recurrence/progression.   She developed cough/respiratory symptoms and was seen by pulmonology. It was not felt that her symptoms were secondary to [facility] (eg pneumonitis). The patient self-discontinued therapy in July, 2021 (plan had been for ongoing [place]) and did not return for follow-up for approximately 1 year.   CT abd/pelvis (surveillance) in June, 2022 was performed at [facility] and showed no evidence of persistent/recurrent disease.    Interval History: Patient reports overall doing well.  She continues to have some back pain, initially states unchanged from her last visit with me but then says that she thinks it has worsened.  This is low back pain with some radiation down the side of both legs.  She had previously seen an orthopedic physician for this last year and had been given steroid shots without much relief.  She has been told she has degenerative disc disease.  Continues to have urinary incontinence, somewhat worse recently as she ran out of Myrbetriq.  She is seeing Dr. Wannetta Sender in August.  She denies any vaginal bleeding or discharge.  Reports baseline bowel function.  Denies any pelvic pain.  Past Medical/Surgical History: Past Medical History:  Diagnosis Date   Acquired hypothyroidism 04/26/2019   Arthritis    Back pain 2/48/1859   Complication of anesthesia 01/10/2016   when [place] feels like lump in throat had endo for [place] feeling  continues   Coronary artery disease involving native coronary artery of native heart without angina pectoris 09/08/2014   DDD (degenerative disc disease), cervical 03/03/2014   Depression    Diverticulitis    Elevated liver enzymes 04/05/2019   Endometrial cancer [facility])    Essential hypertension    Exposure to TB    latent takes rifadin for  4 months for started nov 2017   Family history of brain cancer    Family history of breast cancer    Family history of colon cancer    Genetic testing 05/01/2016   Negative genetic testing on the TumorNext Lynch with CancerNext.  This is paired germline and tumor analyses for enhanced diagnosis of lynch syndrome plus analyses of 29 additional genes associated with hereditary cancer.  The CancerNext gene panel offered by [employer] includes sequencing and rearrangement analysis for the following 34 genes:   APC, ATM, BARD1, BMPR1A, BRCA1, BRCA2, BRIP1, CD   GERD (gastroesophageal reflux disease)    Goals of care, counseling/discussion 03/02/2019   H. 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) ([place]) 03/05/2019   Urinary incontinence     Past Surgical History:  Procedure Laterality Date   CHOLECYSTECTOMY  2000   COLECTOMY  2016   colonscopy     ROBOTIC ASSISTED TOTAL HYSTERECTOMY WITH BILATERAL SALPINGO OOPHERECTOMY Bilateral 01/18/2016   Procedure: XI ROBOTIC ASSISTED TOTAL HYSTERECTOMY WITH BILATERAL SALPINGO OOPHORECTOMY WITH SENTINAL LYMPH NODE BIOPSY; LYSIS OF ADHESIONS;  Surgeon: Everitt Amber, MD;  Location: [facility];  Service: Gynecology;  Laterality: Bilateral;   TUBAL LIGATION  1991   UPPER GI ENDOSCOPY  01/10/2016    Family History  Problem Relation Age of Onset   COPD Mother    CAD Father    Pulmonary embolism Sister    Heart attack Brother    COPD Brother    Cancer Maternal Aunt        lung ca   Cancer Maternal  Uncle        brain ca   Colon cancer Maternal Uncle    Cancer Maternal Grandmother 39       breast ca   Brain cancer Cousin        dx under 45   Ovarian cancer Neg Hx    Endometrial cancer Neg Hx    Pancreatic cancer Neg Hx    Prostate cancer Neg Hx     Social History   Socioeconomic History   Marital status: Married    Spouse name: Richard   Number of children: 3   Years of education: Not on file   Highest education level: Not on file  Occupational History   Occupation: Non-working, applying disability  Tobacco Use   Smoking status: Former    Packs/day: 1.00    Years: 10.00    Total pack years: 10.00    Types: Cigarettes    Quit date: 12/05/1988    Years since quitting: 32.6   Smokeless tobacco: Never  Vaping Use   Vaping  Use: Never used  Substance and Sexual Activity   Alcohol use: No   Drug use: No   Sexual activity: Yes    Partners: Male  Other Topics Concern   Not on file  Social History Narrative   Taking care of her grandson who has autism, 19 years old   Social Determinants of Radio broadcast assistant Strain: Not on file  Food Insecurity: Not on file  Transportation Needs: Not on file  Physical Activity: Not on file  Stress: Not on file  Social Connections: Not on file    Current Medications:  Current Outpatient Medications:    dicyclomine (BENTYL) 10 MG capsule, Take 10 mg by mouth 2 (two) times daily as needed for spasms., Disp: , Rfl:    escitalopram (LEXAPRO) 20 MG tablet, Take 30 mg by mouth daily., Disp: , Rfl: 1   esomeprazole (NEXIUM) 40 MG capsule, Take 40 mg by mouth 2 (two) times daily., Disp: , Rfl:    ketoconazole (NIZORAL) 2 % shampoo, ketoconazole 2 % shampoo  APPLY TO WASH TWICE WEEKLY, Disp: , Rfl:    levothyroxine (SYNTHROID) 112 MCG tablet, Take 112 mcg by mouth daily., Disp: , Rfl:    losartan (COZAAR) 50 MG tablet, Take 1 tablet by mouth daily., Disp: , Rfl:    nystatin (MYCOSTATIN/NYSTOP) powder, Apply 1 application  topically 3 (three) times daily., Disp: 15 g, Rfl: 1   ondansetron (ZOFRAN) 8 MG tablet, Take 1 tablet (8 mg total) by mouth 2 (two) times daily as needed (Nausea or vomiting)., Disp: 30 tablet, Rfl: 1   pravastatin (PRAVACHOL) 20 MG tablet, , Disp: , Rfl:    QUEtiapine (SEROQUEL) 50 MG tablet, Take 50 mg by mouth daily., Disp: , Rfl:    rosuvastatin (CRESTOR) 40 MG tablet, Take 40 mg by mouth daily., Disp: , Rfl:    XHANCE 93 MCG/ACT EXHU, Place 1 spray into both nostrils 2 (two) times daily., Disp: , Rfl:   Review of Systems: + Back pain, anxiety, depression Denies appetite changes, fevers, chills, fatigue, unexplained weight changes. Denies hearing loss, neck lumps or masses, mouth sores, ringing in ears or voice changes. Denies cough or wheezing.  Denies shortness of breath. Denies chest pain or palpitations. Denies leg swelling. Denies abdominal distention, pain, blood in stools, constipation, diarrhea, nausea, vomiting, or early satiety. Denies pain with intercourse, dysuria, frequency, hematuria or incontinence. Denies hot flashes, pelvic pain, vaginal bleeding or vaginal discharge.   Denies joint pain or muscle pain/cramps. Denies itching, rash, or wounds. Denies dizziness, headaches, numbness or seizures. Denies swollen lymph nodes or glands, denies easy bruising or bleeding. Denies confusion, or decreased concentration.  Physical Exam: BP 129/84 (BP Location: Left Arm, Patient Position: Sitting)   Pulse 77   Temp 98.4 F (36.9 C) (Oral)   Resp 16   Ht $R'5\' 6"'NQ$  (1.676 m)   Wt 222 lb 1.6 oz (100.7 kg)   SpO2 95%   BMI 35.85 kg/m  General: Alert, oriented, no acute distress. HEENT: Normocephalic, atraumatic, sclera anicteric. Chest: Clear to auscultation bilaterally.  No wheezes or rhonchi. Cardiovascular: Regular rate and rhythm, no murmurs. Abdomen: Obese, soft, nontender.  Normoactive bowel sounds.  No masses or hepatosplenomegaly appreciated.  Well-healed  incisions. Back: Moderate tenderness with palpation along paraspinous muscles of low back Extremities: Grossly normal range of motion.  Warm, well perfused.  No edema bilaterally. Skin: No rashes or lesions noted. Lymphatics: No cervical, supraclavicular, or inguinal adenopathy. GU: Normal appearing external genitalia without erythema,  excoriation, or lesions.  Speculum exam reveals moderately atrophic vaginal mucosa, no lesions noted, no bleeding or discharge.  Bimanual exam reveals cuff intact, smooth, no nodularity.  Rectovaginal exam confirms these findings, no masses.  Laboratory & Radiologic Studies: None new  Assessment & Plan: Allison Spencer is a 61 y.o. woman with recurrent grade 1 endometrioid endometrial cancer diagnosed in January, 2018, recurrence diagnosed 10/06/18. 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  S/p salvage therapy with pembrolizumab and radiation to para-aortic nodes.  Complete clinical and radiographic response, completed in mid 2021.   Patient is overall doing well and is NED on exam today.  Given continued, possibly worsened back pain, which was a symptom when she recurred in 2021, discussed getting CT scan of the abdomen and pelvis.  This was ordered today.  We will have my office schedule this at [facility].  We will defer giving oral contrast as this has made her sick in the past and will use just IV contrast.   Patient has a history of overactive bladder and is on Myrbetriq.  She is scheduled to see Dr. Wannetta Sender in August.  I have asked her to call me if she is not able to get a hold of her PCP for refill on Myrbetriq.   From a cancer standpoint, she is now 2 years out from completion of treatment for recurrence.  If CT scan is negative for evidence of metastatic disease, we will transition to visits every 6 months.  We  reviewed signs and symptoms that should prompt a phone call before her neck scheduled visit.  I will call her once I have CT results.  22 minutes of total time was spent for this patient encounter, including preparation, face-to-face counseling with the patient and coordination of care, and documentation of the encounter.  Jeral Pinch, MD  Division of Gynecologic Oncology  Department of Obstetrics and Gynecology  [facility]

## 2021-07-17 ENCOUNTER — Telehealth: Payer: Self-pay | Admitting: *Deleted

## 2021-07-17 NOTE — Telephone Encounter (Signed)
Patient called back and was given the date/time and instructions for the CT scan

## 2021-07-17 NOTE — Telephone Encounter (Signed)
Per Dr Berline Lopes scheduled the patient for a CT scan at [facility]. CT scan scheduled for 7/13 at 2:15 pm. Fax order, labs and insurance authorization to [facility] and left the patient a message to call the office back. Patient needs to be given the appt date/time and instructions

## 2021-07-20 ENCOUNTER — Ambulatory Visit
Admission: RE | Admit: 2021-07-20 | Discharge: 2021-07-20 | Disposition: A | Discharge status: Home / Self Care | Payer: Self-pay | Source: Ambulatory Visit | Attending: Gynecologic Oncology | Admitting: Gynecologic Oncology

## 2021-07-20 ENCOUNTER — Other Ambulatory Visit: Payer: Self-pay

## 2021-07-20 ENCOUNTER — Telehealth: Payer: Self-pay

## 2021-07-20 DIAGNOSIS — C541 Malignant neoplasm of endometrium: Secondary | ICD-10-CM

## 2021-07-20 NOTE — Telephone Encounter (Signed)
Spoke with Ms. Guardiola this morning and reviewed CT scan results. Per Joylene John, NP there is no evidence of cancer on scan. Back pain does not look related to cancer. Also noted on the scan is diverticulosis, moderate hiatal hernia, and slightly enlarged liver with stable cysts. Advised patient to follow up with her PCP. Patient verbalized understanding. Instructed to call with any needs.   A copy of CT report has been successfully faxed to PCP [facility]).

## 2021-08-10 ENCOUNTER — Encounter: Payer: Self-pay | Admitting: Obstetrics and Gynecology

## 2021-08-10 ENCOUNTER — Ambulatory Visit (INDEPENDENT_AMBULATORY_CARE_PROVIDER_SITE_OTHER): Payer: Medicare Other | Admitting: Obstetrics and Gynecology

## 2021-08-10 VITALS — BP 120/88 | HR 83 | Ht 62.0 in | Wt 221.0 lb

## 2021-08-10 DIAGNOSIS — N3281 Overactive bladder: Secondary | ICD-10-CM | POA: Diagnosis not present

## 2021-08-10 DIAGNOSIS — N393 Stress incontinence (female) (male): Secondary | ICD-10-CM | POA: Diagnosis not present

## 2021-08-10 DIAGNOSIS — R350 Frequency of micturition: Secondary | ICD-10-CM | POA: Diagnosis not present

## 2021-08-10 DIAGNOSIS — N816 Rectocele: Secondary | ICD-10-CM | POA: Diagnosis not present

## 2021-08-10 LAB — POCT URINALYSIS DIPSTICK
Bilirubin, UA: NEGATIVE
Glucose, UA: NEGATIVE
Ketones, UA: NEGATIVE
Nitrite, UA: NEGATIVE
Protein, UA: NEGATIVE
Spec Grav, UA: 1.02 (ref 1.010–1.025)
Urobilinogen, UA: 0.2 E.U./dL
pH, UA: 6 (ref 5.0–8.0)

## 2021-08-10 NOTE — Progress Notes (Signed)
[facility] New Patient Evaluation and Consultation  Referring Provider: Lafonda Mosses, MD PCP: Imagene Riches, NP Date of Service: 08/10/2021  SUBJECTIVE Chief Complaint: New Patient (Initial Visit) (Allison Spencer is a 61 y.o. female here for a consul ton OAB. Pt is currently on Myrbetric $RemoveBefo'25mg'AuDPhYjuSJG$ . Pt said she previously tried vesicare and it stopped working. )  History of Present Illness: Allison Spencer is a 61 y.o. White or Caucasian female seen in consultation at the request of Dr. Berline Lopes for evaluation of incontinence.    Review of records from Dr Berline Lopes significant for: 11/2016 underwent robotic-assisted laparoscopic total hysterectomy with bilateral salpingoophorectomy, sentinel lymph node biopsy, lysis of adhesions for endometrial cancer. Has been on Myrbetriq for urinary incontinence.   Urinary Symptoms: Unsure when she leaks urine- can see a spot in her underwear but not aware of when it is happening. Has been going on for years. She does have a strong urge to urinate and leaks on the way to the bathroom as well. Unsure how many times she leaks in a day but does happen everyday.  Pad use: none She is bothered by her UI symptoms. Currently on Myrbetriq. Has taken Vesicare and did not work well for her. Myrbertiq has been helping with the bladder urgency but not helping with leakage.  She thinks she had urodynamic testing with a Urologist in [place] but states the Dr retired and the practice is no longer there.   Day time voids 6+.  Nocturia: 2-3 times per night to void. Voiding dysfunction: she does not empty her bladder well.  does not use a catheter to empty bladder.  When urinating, she feels dribbling after finishing and feels the need to start again shortly after Drinks: 1 cup coffee in AM, 3-4 bottles water, 2-3 diet green tea per day  UTIs: 2 UTI's in the last year.   Denies history of blood in urine  Pelvic Organ Prolapse Symptoms:                   She Admits to a feeling of a bulge the vaginal area. It has been present for " a while" She Admits to seeing a bulge.  This bulge is not bothersome.  Bowel Symptom: Bowel movements: alternates constipation and diarrhea- Has IBS Stool consistency: hard or loose Straining: yes, sometimes Splinting: no.  Incomplete evacuation: yes, sometimes She Denies accidental bowel leakage / fecal incontinence Bowel regimen: fiber- metamucil Last colonoscopy: Date- 2019- negative  Sexual Function Sexually active: yes.  Pain with sex: No  Pelvic Pain Denies pelvic pain   Past Medical History:  Past Medical History:  Diagnosis Date   Acquired hypothyroidism 04/26/2019   Arthritis    Back pain 50/03/7046   Complication of anesthesia 01/10/2016   when [place] feels like lump in throat had endo for [place] feeling continues   Coronary artery disease involving native coronary artery of native heart without angina pectoris 09/08/2014   DDD (degenerative disc disease), cervical 03/03/2014   Depression    Diverticulitis    Elevated liver enzymes 04/05/2019   Endometrial cancer ([place])    Essential hypertension    Exposure to TB    latent takes rifadin for 4 months for started nov 2017   Family history of brain cancer    Family history of breast cancer    Family history of colon cancer    Genetic testing 05/01/2016   Negative genetic testing on the TumorNext Lynch with CancerNext.  This is paired germline and tumor analyses for enhanced diagnosis of lynch syndrome plus analyses of 29 additional genes associated with hereditary cancer.  The CancerNext gene panel offered by [facility] includes sequencing and rearrangement analysis for the following 34 genes:   APC, ATM, BARD1, BMPR1A, BRCA1, BRCA2, BRIP1, CD   GERD (gastroesophageal reflux disease)    Goals of care, counseling/discussion 03/02/2019   H. 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[place]) 03/05/2019   Urinary incontinence      Past Surgical History:   Past Surgical History:  Procedure Laterality Date   CHOLECYSTECTOMY  2000   COLECTOMY  2016   For diverticultis   colonscopy     ROBOTIC ASSISTED TOTAL HYSTERECTOMY WITH BILATERAL SALPINGO OOPHERECTOMY Bilateral 01/18/2016   Procedure: XI ROBOTIC ASSISTED TOTAL HYSTERECTOMY WITH BILATERAL SALPINGO OOPHORECTOMY WITH SENTINAL LYMPH NODE BIOPSY; LYSIS OF ADHESIONS;  Surgeon: Everitt Amber, MD;  Location: [facility];  Service: Gynecology;  Laterality: Bilateral;   TUBAL LIGATION  1991   UPPER GI ENDOSCOPY  01/10/2016     Past OB/GYN History: OB History  Gravida Para Term Preterm AB Living  3         3  SAB IAB Ectopic Multiple Live Births          3    # Outcome Date GA Lbr Len/2nd Weight Sex Delivery Anes PTL Lv  3 Gravida           2 Gravida           1 Gravida             Vaginal deliveries: 3,  Forceps/ Vacuum deliveries: 0 S/p hysterectomy for endometrial CA   Medications: She has a current medication list which includes the following prescription(s): escitalopram, esomeprazole, famotidine, levothyroxine, losartan, metronidazole, nitrofurantoin, rosuvastatin, xhance, and quetiapine.   Allergies: Patient is allergic to codeine, other, and sulfa antibiotics.   Social History:  Social History   Tobacco Use   Smoking status: Former    Packs/day: 1.00    Years: 10.00    Total pack years: 10.00    Types: Cigarettes    Quit date: 12/05/1988    Years since quitting: 32.7    Smokeless tobacco: Never  Vaping Use   Vaping Use: Never used  Substance Use Topics   Alcohol use: No   Drug use: No    Relationship status: married She lives with husband and grandson.   She is not employed. Regular exercise: Yes: treadmill History of abuse: No  Family History:   Family History  Problem Relation Age of Onset   COPD Mother    CAD Father    Pulmonary embolism Sister    Heart attack Brother    COPD Brother    Cancer Maternal Aunt        lung ca   Cancer Maternal Uncle        brain ca   Colon cancer Maternal Uncle  Cancer Maternal Grandmother 56       breast ca   Brain cancer Cousin        dx under 50   Ovarian cancer Neg Hx    Endometrial cancer Neg Hx    Pancreatic cancer Neg Hx    Prostate cancer Neg Hx      Review of Systems: Review of Systems  Constitutional:  Negative for fever, malaise/fatigue and weight loss.  Respiratory:  Negative for cough, shortness of breath and wheezing.   Cardiovascular:  Positive for palpitations. Negative for chest pain and leg swelling.  Gastrointestinal:  Negative for abdominal pain and blood in stool.  Genitourinary:  Negative for dysuria.  Musculoskeletal:  Negative for myalgias.  Skin:  Negative for rash.  Neurological:  Negative for dizziness and headaches.  Endo/Heme/Allergies:  Does not bruise/bleed easily.  Psychiatric/Behavioral:  Positive for depression. The patient is nervous/anxious.      OBJECTIVE Physical Exam: Vitals:   08/10/21 0854  BP: 120/88  Pulse: 83  Weight: 221 lb (100.2 kg)  Height: $Remove'5\' 2"'fpvjTeo$  (1.575 m)    Physical Exam Constitutional:      General: She is not in acute distress.    Appearance: She is obese.  Pulmonary:     Effort: Pulmonary effort is normal.  Abdominal:     General: There is no distension.     Palpations: Abdomen is soft.     Tenderness: There is no abdominal tenderness. There is no rebound.     Comments: Midline incision in upper abdomen and laparoscopic  incisions present.   Musculoskeletal:        General: No swelling. Normal range of motion.  Skin:    General: Skin is warm and dry.     Findings: No rash.  Neurological:     Mental Status: She is alert and oriented to person, place, and time.  Psychiatric:        Mood and Affect: Mood normal.        Behavior: Behavior normal.      GU / Detailed Urogynecologic Evaluation:  Pelvic Exam: Normal external female genitalia; Bartholin's and Skene's glands normal in appearance; urethral meatus normal in appearance, no urethral masses or discharge.   CST: negative  s/p hysterectomy: Speculum exam reveals normal vaginal mucosa without  atrophy and normal vaginal cuff.  Adnexa no mass, fullness, tenderness.     Pelvic floor strength III/V, puborectalis IV/V external anal sphincter IV/V  Pelvic floor musculature: Right levator non-tender, Right obturator non-tender, Left levator non-tender, Left obturator non-tender  POP-Q:   POP-Q  -2.5                                            Aa   -2.5                                           Ba  -8                                              C   4  Gh  4.5                                            Pb  8                                            tvl   -0.5                                            Ap  -0.5                                            Bp                                                 D     Rectal Exam:  Normal sphincter tone, small distal rectocele, enterocoele not present, no rectal masses, no sign of dyssynergia when asking the patient to bear down.  Post-Void Residual (PVR) by Bladder Scan: In order to evaluate bladder emptying, we discussed obtaining a postvoid residual and she agreed to this procedure.  Procedure: The ultrasound unit was placed on the patient's abdomen in the suprapubic region after the patient had voided. A PVR of 105 ml was obtained by bladder  scan.  Laboratory Results: POC urine: trace blood, small leukocytes   ASSESSMENT AND PLAN Ms. Lebow is a 61 y.o. with:  1. SUI (stress urinary incontinence, female)   2. Overactive bladder   3. Urinary frequency   4. Prolapse of posterior vaginal wall    SUI - unclear if she has symptoms of SUI from history. Will proceed with urodynamic testing to further evaluate so we can better discuss treatment options.   2. OAB - We discussed the symptoms of overactive bladder (OAB), which include urinary urgency, urinary frequency, nocturia, with or without urge incontinence.  While we do not know the exact etiology of OAB, several treatment options exist. We discussed management including behavioral therapy (decreasing bladder irritants, urge suppression strategies, timed voids, bladder retraining), physical therapy, medication; for refractory cases posterior tibial nerve stimulation, sacral neuromodulation, and intravesical botulinum toxin injection.  - Continue with Myrbetriq.  - Cut back/ remove green tea from diet since this can be contributing to bladder irritation.   3. Stage I anterior, Stage II posterior, Stage 0 apical prolapse - For treatment of pelvic organ prolapse, we discussed options for management including expectant management, conservative management, and surgical management, such as Kegels, a pessary, pelvic floor physical therapy, and specific surgical procedures. - Not bothersome to her at this time. Discussed preventing constipation/ straining which can exacerbate prolapse symptoms.   Return for urodynamics.   Jaquita Folds, MD

## 2021-08-10 NOTE — Patient Instructions (Addendum)
URODYNAMICS (UDS) TEST INFORMATION  IMPORTANT: Please try to arrive with a comfortably full bladder!    What is UDS? Urodynamics is a bladder test used to evaluate how your bladder and urethra (tube you urinate out of) work to help find out the cause of your bladder symptoms and evaluate your bladder function in order to make the best treatment plan for you.   What to expect? A nurse will perform the test and will be with you during the entire exam. First we will have to empty your bladder on a special toilet.  After you have emptied your bladder, very small catheters (plastic tubing) will be placed into your bladder and into your vagina (or rectum). These special small catheters measure pressure to help measure your bladder function.  Your bladder will be gently filled with water and you will be asked to cough and strain at several different points during the test.   You will then be asked to empty your bladder in the special toilet with the catheters in place. Most patients can urinate (pee) easily with the catheters in place since the catheters are so small. In total this procedure lasts about 45 minutes to 1 hour.  After your test is completed, you will return (or possibly be seen the same day) to review the results, talk about treatment options and make a plan moving forward.   Today we talked about ways to manage bladder urgency such as altering your diet to avoid irritative beverages and foods (bladder diet) as well as attempting to decrease stress and other exacerbating factors.    The Most Bothersome Foods* The Least Bothersome Foods*  Coffee - Regular & Decaf Tea - caffeinated Carbonated beverages - cola, non-colas, diet & caffeine-free Alcohols - Beer, Red Wine, White Wine, Champagne Fruits - Grapefruit, [place], Orange, [employer] - Cranberry, Grapefruit, Orange, Pineapple Vegetables - Tomato & Tomato Products Flavor Enhancers - Hot peppers, Spicy foods, Chili,  Horseradish, Vinegar, Monosodium glutamate (MSG) Artificial Sweeteners - NutraSweet, Sweet 'N Low, Equal (sweetener), Saccharin Ethnic foods - [place], [place], [place] food [employer]s - low-fat & whole Fruits - Bananas, Blueberries, Honeydew melon, Pears, Raisins, Watermelon Vegetables - Broccoli, Brussels Sprouts, [place], Carrots, Cauliflower, [place], Cucumber, Mushrooms, Peas, Radishes, Squash, Zucchini, White potatoes, Sweet potatoes & yams Poultry - Chicken, Eggs, [place], [employer] - Beef, Programmer, multimedia, Lamb Seafood - Shrimp, [place] fish, Salmon Grains - Oat, Rice Snacks - Pretzels, Popcorn  *Lissa Morales et al. Diet and its role in interstitial cystitis/bladder pain syndrome (IC/BPS) and comorbid conditions. [place] 2012 Jan 11.

## 2021-10-04 ENCOUNTER — Encounter: Payer: Self-pay | Admitting: Obstetrics and Gynecology

## 2021-10-04 ENCOUNTER — Ambulatory Visit (INDEPENDENT_AMBULATORY_CARE_PROVIDER_SITE_OTHER): Payer: Medicare Other | Admitting: Obstetrics and Gynecology

## 2021-10-04 VITALS — BP 126/80 | HR 76 | Ht 63.0 in | Wt 220.0 lb

## 2021-10-04 DIAGNOSIS — R350 Frequency of micturition: Secondary | ICD-10-CM | POA: Diagnosis not present

## 2021-10-04 NOTE — Progress Notes (Signed)
[facility] Urodynamics Procedure  Referring Physician: Rhea Bleacher, NP Date of Procedure: 10/04/2021  [name] is a 61 y.o. female who presents for urodynamic evaluation. Indication(s) for study: mixed incontinence.  She is currently on Myrbetriq for OAB.   Vital Signs: BP 126/80 (BP Location: Right Arm)   Pulse 76   Ht '5\' 3"'$  (1.6 m)   Wt 220 lb (99.8 kg)   BMI 38.97 kg/m   Laboratory Results: A catheterized urine specimen revealed:  POC urine: negative  Voiding Diary: Not performed.   Procedure Timeout:  The correct patient was verified and the correct procedure was verified. The patient was in the correct position and safety precautions were reviewed based on at the patient's history.  Urodynamic Procedure A 60F dual lumen urodynamics catheter was placed under sterile conditions into the patient's bladder. A 60F catheter was placed into the rectum in order to measure abdominal pressure. EMG patches were placed in the appropriate position.  All connections were confirmed and calibrations/adjusted made. Saline was instilled into the bladder through the dual lumen catheters.  Cough/valsalva pressures were measured periodically during filling.  Patient was allowed to void.  The bladder was then emptied of its residual.  UROFLOW: Revealed a Qmax of 3.5 mL/sec.  She voided 29 mL and had a residual of 15 mL.  It was a interrupted pattern and represented normal habits though interpretation limited due to low voided volume.  CMG: This was performed with sterile water in the sitting position at a fill rate of 30 mL/min.    First sensation of fullness was 74.2 mLs,  First urge was 91.5 mLs,  Strong urge was 197.6 mLs and  Capacity was 544 mLs  Stress incontinence was not demonstrated  Highest negative Barrier CLPP was 190 cmH20 at 284 ml. Highest negative Barrier VLPP was 118 cmH20 at 284 ml.  Detrusor function was normal, with no phasic contractions seen.     Compliance:  normal. End fill detrusor pressure was -6.5 cmH20.    UPP: MUCP with barrier reduction was 47 cm of water.    MICTURITION STUDY: Voiding was performed with reduction using scopettes in the sitting position.  Qmax was 31.3 mL/sec.  It was a normal pattern.  She voided 369 mL and had a residual of 175 mL.  It was volitional void, sustained detrusor contraction was not present- rectal catheter was misreading at the time of void and falsely elevated, therefore the detrusor pressure was recorded as negative throughout the void. Abdominal straining could not be evaluated.   EMG: This was performed with patches.  She had voluntary contractions,  urethral sphincter was relaxed with void.  The details of the procedure with the study tracings have been scanned into EPIC.   Urodynamic Impression:  1. Sensation was increased; capacity was normal 2. Stress Incontinence was not demonstrated 3. Detrusor Overactivity was not demonstrated 4. Emptying was normal with a mildly elevated PVR (163m).   Plan: - The patient will follow up  to discuss the findings and treatment options.

## 2021-10-04 NOTE — Patient Instructions (Signed)
Taking Care of Yourself after Urodynamics, Cystoscopy, Coaptite Injection, or Botox Injection   Drink plenty of water for a day or two following your procedure. Try to have about 8 ounces (one cup) at a time, and do this 6 times or more per day unless you have fluid restrictitons AVOID irritative beverages such as coffee, tea, soda, alcoholic or citrus drinks for a day or two, as this may cause burning with urination.  For the first 1-2 days after the procedure, your urine may be pink or red in color. You may have some blood in your urine as a normal side effect of the procedure. Large amounts of bleeding or difficulty urinating are NOT normal. Call the nurse line if this happens or go to the nearest Emergency Room if the bleeding is heavy or you cannot urinate at all and it is after hours. If you had a Coaptite injection in the urethra and need to be catheterized afterward, ask for a pediatric catheter to be used (size 10 or 12-French) so the Coaptite material is not pushed out of place.   You may experience some discomfort or a burning sensation with urination after having this procedure. You can use over the counter Azo or pyridium to help with burning and follow the instructions on the packaging. If it does not improve within 1-2 days, or other symptoms appear (fever, chills, or difficulty urinating) call the office to speak to a nurse.  You may return to normal daily activities such as work, school, driving, exercising and housework on the day of the procedure.

## 2021-10-05 LAB — POCT URINALYSIS DIPSTICK
Bilirubin, UA: NEGATIVE
Blood, UA: NEGATIVE
Glucose, UA: NEGATIVE
Ketones, UA: NEGATIVE
Leukocytes, UA: NEGATIVE
Nitrite, UA: NEGATIVE
Protein, UA: NEGATIVE
Spec Grav, UA: 1.02 (ref 1.010–1.025)
Urobilinogen, UA: 0.2 E.U./dL
pH, UA: 5.5 (ref 5.0–8.0)

## 2021-10-19 ENCOUNTER — Ambulatory Visit: Payer: Medicare Other | Admitting: Obstetrics and Gynecology

## 2021-10-30 ENCOUNTER — Ambulatory Visit: Payer: Medicare Other | Admitting: Obstetrics and Gynecology

## 2021-11-07 ENCOUNTER — Ambulatory Visit: Payer: Medicare Other | Admitting: Obstetrics and Gynecology

## 2021-11-07 NOTE — Progress Notes (Deleted)
[facility] Return Visit  SUBJECTIVE  History of Present Illness: Allison Spencer is a 61 y.o. female seen in follow-up for incontinence. She underwent urodynamic testing. Has been on Myrbetriq for OAB symptoms and previously on vesicare as well.   Urodynamic Impression:  1. Sensation was increased; capacity was normal 2. Stress Incontinence was not demonstrated 3. Detrusor Overactivity was not demonstrated 4. Emptying was normal with a mildly elevated PVR (134m).   Past Medical History: Patient  has a past medical history of Acquired hypothyroidism (04/26/2019), Arthritis, Back pain (031/43/8887, Complication of anesthesia (01/10/2016), Coronary artery disease involving native coronary artery of native heart without angina pectoris (09/08/2014), DDD (degenerative disc disease), cervical (03/03/2014), Depression, Diverticulitis, Elevated liver enzymes (04/05/2019), [place]ometrial [place] ([place], Essential hypertension, Exposure to TB, Family history of brain cancer, Family history of breast cancer, Family history of colon cancer, Genetic testing (05/01/2016), GERD (gastroesophageal reflux disease), Goals of care, counseling/discussion (03/02/2019), H. 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   Past Surgical History: She  has a past surgical history that includes Colectomy (2016); Cholecystectomy (2000); Tubal ligation (1991); Upper gi endoscopy (01/10/2016); colonscopy; and Robotic assisted total hysterectomy with bilateral salpingo oophorectomy  (Bilateral, 01/18/2016).   Medications: She has a current medication list which includes the following prescription(s): escitalopram, esomeprazole, famotidine, levothyroxine, losartan, metronidazole, nitrofurantoin, quetiapine, rosuvastatin, and xhance.   Allergies: Patient is allergic to codeine, other, and sulfa antibiotics.   Social History: Patient  reports that she quit smoking about 32 years ago. Her smoking use included cigarettes. She has a 10.00 pack-year smoking history. She has never used smokeless tobacco. She reports that she does not drink alcohol and does not use drugs.      OBJECTIVE     Physical Exam: There were no vitals filed for this visit. Gen: No apparent distress, A&O x 3.  Detailed Urogynecologic Evaluation:  Deferred. Prior exam showed:  POP-Q (08/10/21):    POP-Q   -2.5                                            Aa   -2.5                                           Ba   -8                                              C    4                                            Gh   4.5  Pb   8                                            tvl    -0.5                                            Ap   -0.5                                            Bp                                                  D        ASSESSMENT AND PLAN    Ms. Hamlett is a 61 y.o. with:  No diagnosis found.

## 2021-11-16 ENCOUNTER — Ambulatory Visit (INDEPENDENT_AMBULATORY_CARE_PROVIDER_SITE_OTHER): Payer: Medicare Other | Admitting: Obstetrics and Gynecology

## 2021-11-16 ENCOUNTER — Encounter: Payer: Self-pay | Admitting: Obstetrics and Gynecology

## 2021-11-16 VITALS — BP 143/83 | HR 68

## 2021-11-16 DIAGNOSIS — N3281 Overactive bladder: Secondary | ICD-10-CM | POA: Diagnosis not present

## 2021-11-16 MED ORDER — MIRABEGRON ER 50 MG PO TB24: 50.0000 mg | ORAL_TABLET | Freq: Every day | ORAL | 5 refills | Status: DC

## 2021-11-16 NOTE — Progress Notes (Signed)
[facility] Return Visit  SUBJECTIVE  History of Present Illness: Allison Spencer is a 61 y.o. female seen in follow-up after urodynamic testing.   Urodynamic Impression:  1. Sensation was increased; capacity was normal 2. Stress Incontinence was not demonstrated 3. Detrusor Overactivity was not demonstrated 4. Emptying was normal with a mildly elevated PVR (124m).  Of note- during voiding study, there was an increase in Pves but the Pabd catheter showed a falsely elevated reading and therefore the Pdet was reading negative. With the appropriate rise in Pves, the assumption was made that there was an appropriate detrusor contraction present.   Past Medical History: Patient  has a past medical history of Acquired hypothyroidism (04/26/2019), Arthritis, Back pain ([id number], Complication of anesthesia (01/10/2016), Coronary artery disease involving native coronary artery of native heart without angina pectoris (09/08/2014), DDD (degenerative disc disease), cervical (03/03/2014), Depression, Diverticulitis, Elevated liver enzymes (04/05/2019), [place], Essential hypertension, Exposure to TB, Family history of brain cancer, Family history of breast cancer, Family history of colon cancer, Genetic testing (05/01/2016), GERD (gastroesophageal reflux disease), Goals of care, counseling/discussion (03/02/2019), H. 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   Past Surgical History: She  has a past surgical history that includes Colectomy  (2016); Cholecystectomy (2000); Tubal ligation (1991); Upper gi endoscopy (01/10/2016); colonscopy; and Robotic assisted total hysterectomy with bilateral salpingo oophorectomy (Bilateral, 01/18/2016).   Medications: She has a current medication list which includes the following prescription(s): mirabegron er, escitalopram, esomeprazole, famotidine, levothyroxine, losartan, metronidazole, nitrofurantoin, quetiapine, rosuvastatin, and xhance.   Allergies: Patient is allergic to codeine, other, and sulfa antibiotics.   Social History: Patient  reports that she quit smoking about 32 years ago. Her smoking use included cigarettes. She has a 10.00 pack-year smoking history. She has never used smokeless tobacco. She reports that she does not drink alcohol and does not use drugs.      OBJECTIVE     Physical Exam: Vitals:   11/16/21 1034  BP: (!) 143/83  Pulse: 68   Gen: No apparent distress, A&O x 3.  Detailed Urogynecologic Evaluation:  Deferred.    ASSESSMENT AND PLAN    Allison Spencer is a 61y.o. with:  1. Overactive bladder    - We discussed she did not demonstrate stress leakage during the test. She had a mildly elevated PVR, but still within normal.  - We discussed options of increasing myrbetriq dose or choosing a third line therapty. We reviewed the procedure for intravesical Botox injection with cystoscopy in the office and reviewed the risks, benefits and alternatives of treatment including but not limited to infection, need for self-catheterization and need for repeat therapy.  We discussed that there is a 5-15% chance of needing to catheterize with Botox and that this usually resolves in a few months; however can persist for longer periods of time.  Typically Botox injections would need to be repeated every 3-12 months since this is not a permanent therapy.  -We discussed the role of sacral neuromodulation and how it works. It requires a test phase, and documentation of bladder  function via diary. After a successful test period, a permanent wire and generator are placed in the OR. We discussed this can also help improve  bladder emptying.  -We also discussed the role of percutaneous tibial nerve stimulation and how it works.  She understands it requires 12 weekly visits for temporary neuromodulation of the sacral nerve roots via the tibial nerve and that she may then require continued tapered treatment.   - At this time, she would like to increase her dose of Myrbetriq to 57m. Handouts provided on other options for her to consider.   Return 6 weeks  MJaquita Folds MD

## 2021-12-25 ENCOUNTER — Ambulatory Visit: Payer: Medicare Other | Admitting: Obstetrics and Gynecology

## 2022-02-07 NOTE — Progress Notes (Signed)
[name]  SUBJECTIVE  History of Present Illness: Allison Spencer is a 62 y.o. female seen in follow-up for overactive bladder.   At last visit, Myrbetriq was increased to '50mg'$  daily. She has noticed a lot less leakage and is not really noticing much at all. Maybe occasional leakage. Overall she is pretty happy with the medication.    Has seen some blood in her urine with wiping recently. Last time this happened she had a urinary tract infection. Has also noticed some burning with urination as well. She was recently treated by her PCP for an infection and was taking doxycycline (she believes it was a UTI).    Previously had also tried '25mg'$  Myrbetriq and vesicare without significant improvement.   Past Medical History: Patient  has a past medical history of Acquired hypothyroidism (04/26/2019), Arthritis, Back pain ([id number]/09/6281), Complication of anesthesia (01/10/2016), Coronary artery disease involving native coronary artery of native heart without angina pectoris (09/08/2014), DDD (degenerative disc disease), cervical (03/03/2014), Depression, Diverticulitis, Elevated liver enzymes (04/05/2019), Endometrial cancer ([place]), Essential hypertension, Exposure to TB, Family history of brain cancer, Family history of breast cancer, Family history of colon cancer, Genetic testing (05/01/2016), GERD (gastroesophageal reflux disease), Goals of care, counseling/discussion (03/02/2019), H. 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   Past Surgical  History: She  has a past surgical history that includes Colectomy (2016); Cholecystectomy (2000); Tubal ligation (1991); Upper gi endoscopy (01/10/2016); colonscopy; and Robotic assisted total hysterectomy with bilateral salpingo oophorectomy (Bilateral, 01/18/2016).   Medications: She has a current medication list which includes the following prescription(s): escitalopram, esomeprazole, famotidine, levothyroxine, losartan, metronidazole, mirabegron er, nitrofurantoin, quetiapine, rosuvastatin, and xhance.   Allergies: Patient is allergic to codeine, other, and sulfa antibiotics.   Social History: Patient  reports that she quit smoking about 33 years ago. Her smoking use included cigarettes. She has a 10.00 pack-year smoking history. She has never used smokeless tobacco. She reports that she does not drink alcohol and does not use drugs.      OBJECTIVE     Physical Exam: Vitals:   02/08/22 0926  BP: 122/85  Pulse: 73    Gen: No apparent distress, A&O x 3.  Detailed Urogynecologic Evaluation:  Deferred.    ASSESSMENT AND PLAN    Allison Spencer is a 62 y.o. with:  1. Overactive bladder   2. Hematuria, unspecified type   3. Urinary frequency    OAB - continue with Myrbetriq '50mg'$  daily. Has been working well.   2. Blood in urine - visible blood in urine.  - Will send urine for culture and if negative, discussed need for workup with CT and cystoscopy  Return 1 year or sooner if needed  Jaquita Folds, MD

## 2022-02-08 ENCOUNTER — Ambulatory Visit (INDEPENDENT_AMBULATORY_CARE_PROVIDER_SITE_OTHER): Payer: 59 | Admitting: Obstetrics and Gynecology

## 2022-02-08 ENCOUNTER — Encounter: Payer: Self-pay | Admitting: Obstetrics and Gynecology

## 2022-02-08 VITALS — BP 122/85 | HR 73

## 2022-02-08 DIAGNOSIS — N3281 Overactive bladder: Secondary | ICD-10-CM | POA: Diagnosis not present

## 2022-02-08 DIAGNOSIS — R350 Frequency of micturition: Secondary | ICD-10-CM | POA: Diagnosis not present

## 2022-02-08 DIAGNOSIS — R319 Hematuria, unspecified: Secondary | ICD-10-CM

## 2022-02-08 LAB — POCT URINALYSIS DIPSTICK
Bilirubin, UA: NEGATIVE
Glucose, UA: NEGATIVE
Ketones, UA: NEGATIVE
Nitrite, UA: NEGATIVE
Protein, UA: NEGATIVE
Spec Grav, UA: >=1.03 — AB (ref 1.010–1.025)
Urobilinogen, UA: 0.2 E.U./dL
pH, UA: 6 (ref 5.0–8.0)

## 2022-02-08 NOTE — Patient Instructions (Signed)
Continue with the Myrbetriq '50mg'$  daily. Call with any further concerns.   We will send your urine for culture today to evaluate for an infection and will notify you of the results.

## 2022-02-09 LAB — URINALYSIS, MICROSCOPIC ONLY: Casts: NONE SEEN /lpf

## 2022-02-11 LAB — URINE CULTURE

## 2022-03-20 ENCOUNTER — Telehealth: Payer: Self-pay

## 2022-03-20 NOTE — Telephone Encounter (Signed)
Pt called today stating she was supposed to be seen in January for a follow up with Dr. Berline Lopes.  First available appointment is 4/26 @ 3:00. Pt aware we will put her on a call list in case there is a cancellation. She agrees to date and time,

## 2022-04-03 ENCOUNTER — Ambulatory Visit (INDEPENDENT_AMBULATORY_CARE_PROVIDER_SITE_OTHER): Payer: 59 | Admitting: Podiatry

## 2022-04-03 DIAGNOSIS — L603 Nail dystrophy: Secondary | ICD-10-CM | POA: Diagnosis not present

## 2022-04-03 NOTE — Progress Notes (Signed)
  Subjective:  Patient ID: Allison Spencer, female    DOB: 07-24-1960,   MRN: [id number]  Chief Complaint  Patient presents with   nail care     Patient would like to have nails trimmed     62 y.o. female presents for concern of bilateral hallux nails being thick and discolored. Relates they have been this way for years. Denies trauma. Relates they are occasionally painful and worried about fungus.  . Denies any other pedal complaints. Denies n/v/f/c.   Past Medical History:  Diagnosis Date   Acquired hypothyroidism 04/26/2019   Arthritis    Back pain [id number]   Complication of anesthesia 01/10/2016   when [place] feels like lump in throat had endo for [place] feeling continues   Coronary artery disease involving native coronary artery of native heart without angina pectoris 09/08/2014   DDD (degenerative disc disease), cervical 03/03/2014   Depression    Diverticulitis    Elevated liver enzymes 04/05/2019   Endometrial cancer ([place])    Essential hypertension    Exposure to TB    latent takes rifadin for 4 months for started nov 2017   Family history of brain cancer    Family history of breast cancer    Family history of colon cancer    Genetic testing 05/01/2016   Negative genetic testing on the TumorNext Lynch with CancerNext.  This is paired germline and tumor analyses for enhanced diagnosis of lynch syndrome plus analyses of 29 additional genes associated with hereditary cancer.  The CancerNext gene panel offered by [employer] includes sequencing and rearrangement analysis for the following 34 genes:   APC, ATM, BARD1, BMPR1A, BRCA1, BRCA2, BRIP1, CD   GERD (gastroesophageal reflux disease)    Goals of care, counseling/discussion 03/02/2019   H. 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 CFT <3 seconds. Normal hair growth on digits. No edema.  Skin. No lacerations or abrasions bilateral feet. Bilateral hallux nails are thickened and discolored with subungual debris.  Musculoskeletal: MMT 5/5 bilateral lower extremities in DF, PF, Inversion and Eversion. Deceased ROM in DF of ankle joint.  Neurological: Sensation intact to light touch.   Assessment:   1. Nail dystrophy      Plan:  Patient was evaluated and treated and all questions answered. -Examined patient -Discussed treatment options for painful dystrophic nails  -Clinical picture and Fungal culture was obtained by removing a portion of the hard nail itself from each of the involved toenails using a sterile nail nipper and sent to [facility] lab. Patient tolerated the biopsy procedure well without discomfort or need for anesthesia.  -Discussed fungal nail treatment options including oral, topical, and laser treatments.  -Patient to return in 4 weeks for follow up evaluation and discussion of fungal culture results or sooner if symptoms worsen.   Lorenda Peck, DPM

## 2022-04-03 NOTE — Addendum Note (Signed)
Addended by: Stefani Dama on: 04/03/2022 10:29 AM   Modules accepted: Orders

## 2022-05-01 ENCOUNTER — Ambulatory Visit: Payer: 59 | Admitting: Podiatry

## 2022-05-02 ENCOUNTER — Telehealth: Payer: Self-pay | Admitting: *Deleted

## 2022-05-02 NOTE — Telephone Encounter (Signed)
Patient called and moved her appt from 4/26 to 5/23 due to illness

## 2022-05-03 ENCOUNTER — Inpatient Hospital Stay: Payer: 59 | Admitting: Gynecologic Oncology

## 2022-05-30 ENCOUNTER — Encounter: Payer: Self-pay | Admitting: Gynecologic Oncology

## 2022-05-30 ENCOUNTER — Inpatient Hospital Stay: Payer: 59 | Attending: Gynecologic Oncology | Admitting: Gynecologic Oncology

## 2022-05-30 VITALS — BP 131/75 | HR 68 | Temp 97.5°F | Resp 24 | Ht 62.99 in | Wt 229.6 lb

## 2022-05-30 DIAGNOSIS — Z808 Family history of malignant neoplasm of other organs or systems: Secondary | ICD-10-CM | POA: Diagnosis not present

## 2022-05-30 DIAGNOSIS — Z800 Family history of malignant neoplasm of digestive organs: Secondary | ICD-10-CM | POA: Diagnosis not present

## 2022-05-30 DIAGNOSIS — R062 Wheezing: Secondary | ICD-10-CM | POA: Insufficient documentation

## 2022-05-30 DIAGNOSIS — Z87891 Personal history of nicotine dependence: Secondary | ICD-10-CM | POA: Insufficient documentation

## 2022-05-30 DIAGNOSIS — Z801 Family history of malignant neoplasm of trachea, bronchus and lung: Secondary | ICD-10-CM | POA: Insufficient documentation

## 2022-05-30 DIAGNOSIS — Z90722 Acquired absence of ovaries, bilateral: Secondary | ICD-10-CM | POA: Insufficient documentation

## 2022-05-30 DIAGNOSIS — N3281 Overactive bladder: Secondary | ICD-10-CM | POA: Diagnosis not present

## 2022-05-30 DIAGNOSIS — Z9221 Personal history of antineoplastic chemotherapy: Secondary | ICD-10-CM | POA: Diagnosis not present

## 2022-05-30 DIAGNOSIS — Z803 Family history of malignant neoplasm of breast: Secondary | ICD-10-CM | POA: Insufficient documentation

## 2022-05-30 DIAGNOSIS — Z923 Personal history of irradiation: Secondary | ICD-10-CM | POA: Diagnosis not present

## 2022-05-30 DIAGNOSIS — Z79899 Other long term (current) drug therapy: Secondary | ICD-10-CM | POA: Diagnosis not present

## 2022-05-30 DIAGNOSIS — Z8542 Personal history of malignant neoplasm of other parts of uterus: Secondary | ICD-10-CM | POA: Diagnosis present

## 2022-05-30 DIAGNOSIS — Z90710 Acquired absence of both cervix and uterus: Secondary | ICD-10-CM | POA: Insufficient documentation

## 2022-05-30 DIAGNOSIS — C541 Malignant neoplasm of endometrium: Secondary | ICD-10-CM

## 2022-05-30 DIAGNOSIS — K449 Diaphragmatic hernia without obstruction or gangrene: Secondary | ICD-10-CM | POA: Diagnosis not present

## 2022-05-30 NOTE — Patient Instructions (Signed)
It was good to see you today.  I do not see or feel any evidence of cancer recurrence on your exam.  I will see you for follow-up in 6 months.  As always, if you develop any new and concerning symptoms before your next visit, please call to see me sooner.   

## 2022-05-30 NOTE — Progress Notes (Signed)
Gynecologic Oncology Return Clinic Visit  05/30/22  Reason for Visit: surveillance visit in the setting of endometrial cancer   Treatment History: Oncology History Overview Note  MSI high disease detected   Endometrial cancer (HCC)  07/05/2015 Pathology Results   She had abnormal PAP   11/24/2015 Procedure   She underwent endometrial sampling that came back abnormal   01/10/2016 Procedure   She had EGD which showed esophagitis   01/18/2016 Pathology Results   Diagnosis 1. Lymph node, sentinel, biopsy, right obturator - MICROMETASTASIS IN ONE OF ONE LYMPH NODES (1/1). 2. Lymph node, sentinel, biopsy, left external - ONE OF ONE LYMPH NODES NEGATIVE FOR CARCINOMA (0/1). 3. Lymph node, sentinel, biopsy, left obturator - MICROMETASTASIS IN ONE OF ONE LYMPH NODES (1/1). 4. Uterus +/- tubes/ovaries, neoplastic - UTERUS: -ENDOMYOMETRIUM: ENDOMETRIOID ADENOCARCINOMA, FIGO GRADE 1, SPANNING 3.3 CM. TUMOR INVADES LESS THAN 1/2 OF THE MYOMETRIUM. SEE ONCOLOGY TABLE. -SEROSA: UNREMARKABLE. NO MALIGNANCY IDENTIFIED. - CERVIX: BENIGN SQUAMOUS AND ENDOCERVICAL MUCOSA. NO DYSPLASIA OR MALIGNANCY. - BILATERAL OVARIES: INCLUSION CYSTS. NO MALIGNANCY. - BILATERAL FALLOPIAN TUBES: UNREMARKABLE RIGHT TUBE. LEFT TUBE IS NOT IDENTIFIED GROSSLY OR MICROSCOPICALLY.   01/18/2016 Surgery   She underwent robotic-assisted laparoscopic total hysterectomy with bilateral salpingoophorectomy, sentinel lymph node biopsy, lysis of adhesions   04/24/2016 Genetic Testing   Negative genetic testing on the TumorNext Lynch with CancerNext.  This is paired germline and tumor analyses for enhanced diagnosis of lynch syndrome plus analyses of 29 additional genes associated with hereditary cancer.  The CancerNext gene panel offered by [employer] includes sequencing and rearrangement analysis for the following 34 genes:   APC, ATM, BARD1, BMPR1A, BRCA1, BRCA2, BRIP1, CDH1, CDK4, CDKN2A, CHEK2, DICER, HOXB13, EPCAM,  GREM1, MLH1, MRE11A, MSH2, MSH6, MUTYH, NBN, NF1, PALB2, PMS2, POLD1, POLE, PTEN, RAD50, RAD51C, RAD51D, SMAD4, SMARCA4, STK11, and TP53.   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  Taken with the germline results demonstrating absence of pathogenic mutations or likely pathogenic variants (VLPs) in the mismatch repair (MMR) genes, the likelihood that this individual has Lynch syndrome/HNPCC is greatly decreased; however, the possibility of an undetected variant of either germline or somatic origin due to mosaicism and other rare etiologies cannot be ruled out by the current methodology.  Correlation with clinical history and external tumor testing results is advised.    10/29/2018 PET scan   Outside PET scan showed enlarged aorta-caval LN   12/17/2018 - 01/15/2019 Radiation Therapy   Radiation Treatment Dates: 12/17/2018 through 01/15/2019 Site Technique Total Dose (Gy) Dose per Fx (Gy) Completed Fx Beam Energies  Abdomen: Abd_PA node 3D 50/50 2.5 20/20 6X, 15X        03/01/2019 Imaging   1. No CT findings for metastatic disease involving the chest, abdomen or pelvis. 2. Stable moderate-sized hiatal hernia. 3. Status post cholecystectomy. No biliary dilatation. 4. Stable hepatic cysts.   03/15/2019 - 07/20/2019 Chemotherapy   Patient is on Treatment Plan : UTERINE Pembrolizumab q21d     06/14/2019 Imaging   1. No acute findings within the abdomen or pelvis. No findings of recurrent tumor or metastatic disease. 2. Hiatal hernia    She was for a routine pap smear by her PCP, Mauricio Po, FNP on 07/05/15. This revealed atypical glandular cells - endometrial. She was then seen by Dr Cyndie Chime on 11/24/15 and reported a single episode of light pink spotting but otherwise no bleeding. On 11/24/15 Dr Cyndie Chime performed an endometrial biopsy which revealed well differentiated (FIGO  grade 1) endometrioid endometrial cancer. On 01/18/16 she underwent robotic  assisted total hysterectomy, BSO, and SLN biopsy. Surgery was uncomplicated and there were no suspicious findings intraoperatively.   Final pathology revealed a stage IIIC1 endometrioid endometrial adenocarcinoma with a 3.3cm tumor invading <50% (1cm of 2.4cm) with no LVSI. However, bilateral obturator SLN's revealed micrometastases on both H&E and IHC.   Postoperative imaging (including CT abdo/pelvis and c  hest x ray) revealed no enlarged nodes, residual disease or chest disease.   The patient was recommended to received 6 cycles of adjuvant carboplatin and paclitaxel in accordance with NCCN guidelines. She has met with medical oncology, Dr Artis Delay, but declined adjuvant chemotherapy as she was concerned about immunity. She elected for an "anti-cancer" diet.   She was tested for Lynch syndrome (her tumor is MSI positive with loss of MLH1 expression), however, Lynch syndrome testing was negative.   In 2019 she reported + radicular pain down left leg. She has a known disc prolapse and is seeing an orthopedic surgeon. Repeat CT scan on 05/26/17 showed no evidence of recurrence including no adenopathy in retroperitoneal region, and no explanation for her pain.    She had had a persistent cough since October, 2019. CT chest in October, 2019 showed a tiny subpleural nodule in the anterior aspect of the left lower lobe abutting the major fissure. Annual follow-up was recommended, though it was suspected to be benign. CXR on 02/03/17 showed normal heart and lungs.    In 2020 she reported progressive low back pain, sciatic pain and neck pains. Due to her back pain a CT chest abdomen pelvis was performed at [facility] on October 06, 2018. CT of the abdomen and pelvis revealed a 9 mm short axis right periaortic node that was new and suspicious for recurrent endometrial cancer.  There was no other gross abdominal or retroperitoneal recurrence.   She received pembrolizumab for salvage therapy  between 03/15/19 and 07/20/19. She also received salvage radiation (50 Gy in 20 fractions to the PA nodal region) with Dr Roselind Messier between 12/17/18 through 01/15/19.   Post-treatment imaging on 06/14/19 showed no evidence of recurrence/progression.   She developed cough/respiratory symptoms and was seen by pulmonology. It was not felt that her symptoms were secondary to [facility] (eg pneumonitis). The patient self-discontinued therapy in July, 2021 (plan had been for ongoing Keytruda) and did not return for follow-up for approximately 1 year.   CT abd/pelvis (surveillance) in June, 2022 was performed at [facility] and showed no evidence of persistent/recurrent disease.    Interval History: The patient reports overall doing well.  Had bronchitis earlier this year which took her quite some time to recover from.  Still having some wheezing from this.  Had a CT of the chest at [facility] last week, has not gotten the results of this.  Notes that she has been referred to  GI in the setting of her hiatal hernia.  She denies any vaginal bleeding or discharge.  Urinary symptoms are significantly improved and almost resolved on Myrbetriq.  Endorses normal bowel function.  Denies any abdominal or pelvic pain.  Past Medical/Surgical History: Past Medical History:  Diagnosis Date   Acquired hypothyroidism 04/26/2019   Arthritis    Back pain 02/03/2017   Complication of anesthesia 01/10/2016   when sw[place]ows feels like lump in throat had endo for bur feeling continues   Coronary artery disease involving native coronary artery of native heart without angina pectoris 09/08/2014   DDD (degenerative disc  disease), cervical 03/03/2014   Depression    Diverticulitis    Elevated liver enzymes 04/05/2019   Endometrial cancer [facility])    Essential hypertension    Exposure to TB    latent takes rifadin for 4 months for started nov 2017   Family history of brain cancer    Family history of breast cancer     Family history of colon cancer    Genetic testing 05/01/2016   Negative genetic testing on the TumorNext Lynch with CancerNext.  This is paired germline and tumor analyses for enhanced diagnosis of lynch syndrome plus analyses of 29 additional genes associated with hereditary cancer.  The CancerNext gene panel offered by [employer] includes sequencing and rearrangement analysis for the following 34 genes:   APC, ATM, BARD1, BMPR1A, BRCA1, BRCA2, BRIP1, CD   GERD (gastroesophageal reflux disease)    Goals of care, counseling/discussion 03/02/2019   H. 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) 03/05/2019   Urinary incontinence     Past Surgical History:  Procedure Laterality Date   CHOLECYSTECTOMY  2000   COLECTOMY  2016   For diverticultis   colonscopy     ROBOTIC ASSISTED TOTAL HYSTERECTOMY WITH BILATERAL SALPINGO OOPHERECTOMY Bilateral 01/18/2016   Procedure: XI ROBOTIC ASSISTED TOTAL HYSTERECTOMY WITH BILATERAL SALPINGO OOPHORECTOMY WITH SENTINAL LYMPH NODE BIOPSY; LYSIS OF ADHESIONS;  Surgeon: Adolphus Birchwood, MD;  Location: [facility];  Service: Gynecology;  Laterality: Bilateral;   TUBAL LIGATION  1991   UPPER GI ENDOSCOPY  01/10/2016    Family History  Problem Relation Age of Onset   COPD Mother    CAD Father    Pulmonary embolism Sister    Heart attack Brother    COPD Brother    Cancer Maternal Aunt        lung ca   Cancer Maternal Uncle        brain ca   Colon cancer Maternal Uncle    Cancer  Maternal Grandmother 42       breast ca   Brain cancer Cousin        dx under 50   Ovarian cancer Neg Hx    Endometrial cancer Neg Hx    Pancreatic cancer Neg Hx    Prostate cancer Neg Hx     Social History   Socioeconomic History   Marital status: Married    Spouse name: Richard   Number of children: 3   Years of education: Not on file   Highest education level: Not on file  Occupational History   Occupation: Non-working, applying disability  Tobacco Use   Smoking status: Former    Packs/day: 1.00    Years: 10.00    Additional pack years: 0.00    Total pack years: 10.00    Types: Cigarettes    Quit date: 12/05/1988    Years since quitting: 33.5   Smokeless tobacco: Never  Vaping Use   Vaping Use: Never used  Substance and Sexual  Activity   Alcohol use: No   Drug use: No   Sexual activity: Yes    Partners: Male  Other Topics Concern   Not on file  Social History Narrative   Taking care of her grandson who has autism, 22 years old   Social Determinants of Corporate investment banker Strain: Not on file  Food Insecurity: Not on file  Transportation Needs: Not on file  Physical Activity: Not on file  Stress: Not on file  Social Connections: Not on file    Current Medications:  Current Outpatient Medications:    escitalopram (LEXAPRO) 20 MG tablet, Take 30 mg by mouth daily., Disp: , Rfl: 1   esomeprazole (NEXIUM) 40 MG capsule, Take 40 mg by mouth 2 (two) times daily., Disp: , Rfl:    famotidine (PEPCID) 40 MG tablet, Take 40 mg by mouth daily., Disp: , Rfl:    levothyroxine (SYNTHROID) 112 MCG tablet, Take 112 mcg by mouth daily., Disp: , Rfl:    losartan (COZAAR) 50 MG tablet, Take 1 tablet by mouth daily., Disp: , Rfl:    mirabegron ER (MYRBETRIQ) 50 MG TB24 tablet, Take 1 tablet (50 mg total) by mouth daily., Disp: 30 tablet, Rfl: 5   QUEtiapine (SEROQUEL) 50 MG tablet, Take 50 mg by mouth daily., Disp: , Rfl:    rosuvastatin (CRESTOR) 40 MG tablet, Take  40 mg by mouth daily., Disp: , Rfl:    XHANCE 93 MCG/ACT EXHU, Place 1 spray into both nostrils 2 (two) times daily., Disp: , Rfl:    metroNIDAZOLE (FLAGYL) 500 MG tablet, Take 500 mg by mouth 3 (three) times daily., Disp: , Rfl:    nitrofurantoin (MACRODANTIN) 100 MG capsule, Take 100 mg by mouth 4 (four) times daily., Disp: , Rfl:   Review of Systems: + Wheezing, back pain, anxiety, depression. Denies appetite changes, fevers, chills, fatigue, unexplained weight changes. Denies hearing loss, neck lumps or masses, mouth sores, ringing in ears or voice changes. Denies cough.  Denies shortness of breath. Denies chest pain or palpitations. Denies leg swelling. Denies abdominal distention, pain, blood in stools, constipation, diarrhea, nausea, vomiting, or early satiety. Denies pain with intercourse, dysuria, frequency, hematuria or incontinence. Denies hot flashes, pelvic pain, vaginal bleeding or vaginal discharge.   Denies joint pain or muscle pain/cramps. Denies itching, rash, or wounds. Denies dizziness, headaches, numbness or seizures. Denies swollen lymph nodes or glands, denies easy bruising or bleeding. Denies confusion, or decreased concentration.  Physical Exam: BP 131/75 (BP Location: Left Arm, Patient Position: Sitting)   Pulse 68   Temp (!) 97.5 F (36.4 C) (Oral)   Resp (!) 24   Ht 5' 2.99" (1.6 m)   Wt 229 lb 9.6 oz (104.1 kg)   SpO2 97%   BMI 40.68 kg/m  General: Alert, oriented, no acute distress. HEENT: Normocephalic, atraumatic, sclera anicteric. Chest: Clear to auscultation bilaterally.  Minimal expiratory wheezing bilaterally with some breaths. Cardiovascular: Regular rate and rhythm, no murmurs. Abdomen: Obese, soft, nontender.  Normoactive bowel sounds.  No masses or hepatosplenomegaly appreciated.  Well-healed incisions. Back: Moderate tenderness with palpation along paraspinous muscles of low back Extremities: Grossly normal range of motion.  Warm, well  perfused.  No edema bilaterally. Skin: No rashes or lesions noted. Lymphatics: No cervical, supraclavicular, or inguinal adenopathy. GU: Normal appearing external genitalia without erythema, excoriation, or lesions.  Speculum exam reveals moderately atrophic vaginal mucosa, no lesions noted, no bleeding or discharge.  Bimanual exam reveals cuff intact, smooth, no nodularity.  Rectovaginal exam confirms these findings, no masses.  Laboratory & Radiologic Studies: None new  Assessment & Plan: Allison Spencer is a 62 y.o. woman with recurrent grade 1 endometrioid endometrial cancer diagnosed in January, 2018, recurrence diagnosed 10/06/18. 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  S/p salvage therapy with pembrolizumab and radiation to para-aortic nodes.  Complete clinical and radiographic response, completed in mid 2021. Last imaging 07/2021 was negative for recurrent disease.   Patient is overall doing well and is NED on exam today.    Patient has a history of overactive bladder and is on Myrbetriq. Follows with Dr. Florian Buff.  Is doing much better from a symptom standpoint.   Per NCCN surveillance recommendations, we will continue with surveillance visits every 6 months.  We reviewed signs and symptoms that should prompt a phone call before her next scheduled visit.    20 minutes of total time was spent for this patient encounter, including preparation, face-to-face counseling with the patient and coordination of care, and documentation of the encounter.  Eugene Garnet, MD  Division of Gynecologic Oncology  Department of Obstetrics and Gynecology  [facility]

## 2022-06-18 ENCOUNTER — Other Ambulatory Visit: Payer: Self-pay | Admitting: Family

## 2022-06-18 DIAGNOSIS — Z1231 Encounter for screening mammogram for malignant neoplasm of breast: Secondary | ICD-10-CM

## 2022-06-21 ENCOUNTER — Ambulatory Visit
Admission: RE | Admit: 2022-06-21 | Discharge: 2022-06-21 | Disposition: A | Discharge status: Home / Self Care | Payer: 59 | Source: Ambulatory Visit | Attending: Family | Admitting: Family

## 2022-06-21 DIAGNOSIS — Z1231 Encounter for screening mammogram for malignant neoplasm of breast: Secondary | ICD-10-CM

## 2022-06-25 ENCOUNTER — Encounter: Payer: Self-pay | Admitting: Gastroenterology

## 2022-06-25 ENCOUNTER — Ambulatory Visit (INDEPENDENT_AMBULATORY_CARE_PROVIDER_SITE_OTHER): Payer: 59 | Admitting: Gastroenterology

## 2022-06-25 VITALS — BP 132/86 | HR 82 | Ht 62.0 in | Wt 231.8 lb

## 2022-06-25 DIAGNOSIS — R197 Diarrhea, unspecified: Secondary | ICD-10-CM

## 2022-06-25 DIAGNOSIS — K219 Gastro-esophageal reflux disease without esophagitis: Secondary | ICD-10-CM

## 2022-06-25 MED ORDER — CHOLESTYRAMINE 4 G PO PACK: 4.0000 g | PACK | Freq: Every day | ORAL | 5 refills | Status: DC

## 2022-06-25 NOTE — Progress Notes (Addendum)
06/25/2022 Allison Spencer [id number] Mar 17, 1960   HISTORY OF PRESENT ILLNESS: This is a 62 year old female who is new to our office.  She was referred here by [name], NP, for evaluation of GERD and hiatal hernia.  She tells me that she had followed with Dr. [name] in [place] and was being treated for esophageal ulcers, hiatal hernia, and IBS.  She says that her last colonoscopy with Dr. [name] was in 2019 and her last EGD was in 2018.  She is on Nexium 40 mg daily and famotidine 40 mg at bedtime.  She tells me that despite that she has issues with acid reflux waking her from sleep at night.  She had a partial colectomy, suspect sigmoid resection, for diverticulitis in 2017 at [facility].  Has some issues with loose stools that she fears is probably due to to IBS and stress.  They are raising 2 grandchildren.  She denies any rectal bleeding.   Past Medical History:  Diagnosis Date   Acquired hypothyroidism 04/26/2019   Arthritis    Back pain 02/03/2017   Complication of anesthesia 01/10/2016   when [place] feels like lump in throat had endo for bur feeling continues   Coronary artery disease involving native coronary artery of native heart without angina pectoris 09/08/2014   DDD (degenerative disc disease), cervical 03/03/2014   Depression    Diverticulitis    Elevated liver enzymes 04/05/2019   Endometrial cancer (HCC)    Essential hypertension    Exposure to TB    latent takes rifadin for 4 months for started nov 2017   Family history of brain cancer    Family history of breast cancer    Family history of colon cancer    Genetic testing 05/01/2016   Negative genetic testing on the TumorNext Lynch with CancerNext.  This is paired germline and tumor analyses for enhanced diagnosis of lynch syndrome plus analyses of 29 additional genes associated with hereditary cancer.  The CancerNext gene panel offered by [employer] includes sequencing and rearrangement  analysis for the following 34 genes:   APC, ATM, BARD1, BMPR1A, BRCA1, BRCA2, BRIP1, CD   GERD (gastroesophageal reflux disease)    Goals of care, counseling/discussion 03/02/2019   H. 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) 03/05/2019   Urinary incontinence    Past Surgical History:  Procedure Laterality Date   CHOLECYSTECTOMY  2000   COLECTOMY  2016   For diverticultis   colonscopy     ROBOTIC ASSISTED TOTAL HYSTERECTOMY WITH BILATERAL SALPINGO OOPHERECTOMY Bilateral 01/18/2016   Procedure: XI ROBOTIC ASSISTED TOTAL HYSTERECTOMY WITH BILATERAL SALPINGO OOPHORECTOMY WITH SENTINAL LYMPH NODE BIOPSY; LYSIS OF ADHESIONS;  Surgeon: Adolphus Birchwood, MD;  Location: [facility];  Service: Gynecology;  Laterality: Bilateral;   TUBAL LIGATION  1991   UPPER GI ENDOSCOPY  01/10/2016    reports that she quit smoking about 33 years ago. Her smoking use included cigarettes. She has a 10.00 pack-year  smoking history. She has never used smokeless tobacco. She reports that she does not drink alcohol and does not use drugs. family history includes Brain cancer in her cousin; Breast cancer in her maternal grandmother; CAD in her father; COPD in her brother and mother; Cancer in her maternal aunt and maternal uncle; Cancer (age of onset: 37) in her maternal grandmother; Colon cancer in her maternal uncle; Heart attack in her brother; Pulmonary embolism in her sister. Allergies  Allergen Reactions   Codeine Nausea And Vomiting    Other    Sulfa Antibiotics Nausea And Vomiting      Outpatient Encounter Medications as of 06/25/2022  Medication Sig   albuterol (VENTOLIN HFA) 108 (90 Base) MCG/ACT inhaler Inhale 1 puff into the lungs every 6 (six) hours as needed for wheezing or shortness of breath.   Cholecalciferol (VITAMIN D3) 1.25 MG (50000 UT) CAPS Take 1.25 mg by mouth once a week.   escitalopram (LEXAPRO) 20 MG tablet Take 30 mg by mouth daily.   esomeprazole (NEXIUM) 40 MG capsule Take 40 mg by mouth 2 (two) times daily.   famotidine (PEPCID) 40 MG tablet Take 40 mg by mouth daily.   levothyroxine (SYNTHROID) 112 MCG tablet Take 112 mcg by mouth daily.   losartan (COZAAR) 50 MG tablet Take 1 tablet by mouth daily.   mirabegron ER (MYRBETRIQ) 50 MG TB24 tablet Take 1 tablet (50 mg total) by mouth daily.   QUEtiapine (SEROQUEL) 50 MG tablet Take 50 mg by mouth daily.   rosuvastatin (CRESTOR) 40 MG tablet Take 40 mg by mouth daily.   XHANCE 93 MCG/ACT EXHU Place 1 spray into both nostrils 2 (two) times daily.   No facility-administered encounter medications on file as of 06/25/2022.     REVIEW OF SYSTEMS  : All other systems reviewed and negative except where noted in the History of Present Illness.   PHYSICAL EXAM: BP 132/86   Pulse 82   Ht 5\' 2"  (1.575 m)   Wt 231 lb 12.8 oz (105.1 kg)   BMI 42.40 kg/m  General: Well developed white female in no acute distress Head: Normocephalic and atraumatic Eyes:  Sclerae anicteric, conjunctiva pink. Ears: Normal auditory acuity Lungs: Clear throughout to auscultation; no W/R/R. Heart: Regular rate and rhythm; no M/R/G. Abdomen: Soft, non-distended.  BS present.  Non-tender. Musculoskeletal: Symmetrical with no gross deformities  Skin: No lesions on visible extremities Extremities: No edema  Neurological: Alert oriented x 4, grossly non-focal Psychological:  Alert and cooperative. Normal mood and affect  ASSESSMENT AND PLAN: *GERD: Reports having a lot of  issues with acid reflux despite Nexium 40 mg twice daily and Pepcid 40 mg at bedtime.  Says that she was being treated for esophageal ulcers.  Says that last EGD was in 2018 with Dr. [name].  Will plan for repeat EGD with Dr. Barron Alvine.  The risks, benefits, and alternatives to EGD were discussed with the patient and she consents to proceed.  *Loose stool: Could be in part from IBS and then also she had sigmoid colon resection for diverticulitis in 2017.  Will try some Questran 1 packet daily.  Prescription sent to pharmacy.  **We will try to get previous EGD and colonoscopy records from Dr. [name].  **Addendum: EGD and colonoscopy records received from Dr. [name].  EGD was performed November 2021 with multiple esophageal ulcers noted in the distal 10 cm of the esophagus and a large hiatal hernia.  Esophageal biopsy showed acute nonspecific esophagitis with superficial sloughing.  Fungal organisms in viral cytopathic changes were not seen.  Colonoscopy March 2016 showed only diverticulosis of the large intestine.   CC:  Erskine Emery, NP

## 2022-06-25 NOTE — Patient Instructions (Signed)
We have sent the following medications to your pharmacy for you to pick up at your convenience: Questran 1 packet daily.   You have been scheduled for an endoscopy. Please follow written instructions given to you at your visit today. If you use inhalers (even only as needed), please bring them with you on the day of your procedure.  _______________________________________________________  If your blood pressure at your visit was 140/90 or greater, please contact your primary care physician to follow up on this.  _______________________________________________________  If you are age 63 or older, your body mass index should be between 23-30. Your Body mass index is 42.4 kg/m. If this is out of the aforementioned range listed, please consider follow up with your Primary Care Provider.  If you are age 44 or younger, your body mass index should be between 19-25. Your Body mass index is 42.4 kg/m. If this is out of the aformentioned range listed, please consider follow up with your Primary Care Provider.   ________________________________________________________  The [place] [facility] providers would like to encourage you to use [facility] to communicate with providers for non-urgent requests or questions.  Due to long hold times on the telephone, sending your provider a message by [facility] may be a faster and more efficient way to get a response.  Please allow 48 business hours for a response.  Please remember that this is for non-urgent requests.  _______________________________________________________

## 2022-06-26 ENCOUNTER — Encounter: Payer: Self-pay | Admitting: Gastroenterology

## 2022-06-26 DIAGNOSIS — R197 Diarrhea, unspecified: Secondary | ICD-10-CM | POA: Insufficient documentation

## 2022-06-26 DIAGNOSIS — K219 Gastro-esophageal reflux disease without esophagitis: Secondary | ICD-10-CM | POA: Insufficient documentation

## 2022-06-29 NOTE — Progress Notes (Signed)
Agree with the assessment and plan as outlined by Jessica Zehr, PA-C. ? ?Nyshaun Standage, DO, FACG ? ?

## 2022-07-08 ENCOUNTER — Telehealth: Payer: Self-pay

## 2022-07-08 ENCOUNTER — Encounter: Payer: Self-pay | Admitting: Gastroenterology

## 2022-07-08 ENCOUNTER — Ambulatory Visit (AMBULATORY_SURGERY_CENTER): Payer: 59 | Admitting: Gastroenterology

## 2022-07-08 VITALS — BP 135/73 | HR 70 | Temp 97.7°F | Resp 16 | Ht 62.0 in | Wt 231.0 lb

## 2022-07-08 DIAGNOSIS — K219 Gastro-esophageal reflux disease without esophagitis: Secondary | ICD-10-CM

## 2022-07-08 DIAGNOSIS — K449 Diaphragmatic hernia without obstruction or gangrene: Secondary | ICD-10-CM

## 2022-07-08 DIAGNOSIS — K2970 Gastritis, unspecified, without bleeding: Secondary | ICD-10-CM | POA: Diagnosis not present

## 2022-07-08 DIAGNOSIS — R1110 Vomiting, unspecified: Secondary | ICD-10-CM

## 2022-07-08 DIAGNOSIS — K319 Disease of stomach and duodenum, unspecified: Secondary | ICD-10-CM | POA: Diagnosis not present

## 2022-07-08 DIAGNOSIS — R12 Heartburn: Secondary | ICD-10-CM

## 2022-07-08 MED ORDER — DEXLANSOPRAZOLE 60 MG PO CPDR: 60.0000 mg | DELAYED_RELEASE_CAPSULE | Freq: Every day | ORAL | 3 refills | Status: DC

## 2022-07-08 MED ORDER — SODIUM CHLORIDE 0.9 % IV SOLN: 500.0000 mL | Freq: Once | INTRAVENOUS | Status: DC

## 2022-07-08 NOTE — Op Note (Signed)
[facility] Patient Name: Allison Spencer Procedure Date: 07/08/2022 10:13 AM MRN: [id number] Endoscopist: Doristine Locks , MD, [id number] Age: 62 Referring MD:  Date of Birth: [birth year] Gender: Female Account #: [id number] Procedure:                Upper GI endoscopy Indications:              Heartburn, Esophageal reflux                           62 yo female with long-standing history of GERD                            complicated by erosive esophagitis and hiatal                            hernia on previous endoscopy at outside facility,                            presents with worsening reflux and breakthrough                            symptoms despite Nexium 40 mg BID and Pepcid 40 mg                            qhs. Medicines:                Monitored Anesthesia Care Procedure:                Pre-Anesthesia Assessment:                           - Prior to the procedure, a History and Physical                            was performed, and patient medications and                            allergies were reviewed. The patient's tolerance of                            previous anesthesia was also reviewed. The risks                            and benefits of the procedure and the sedation                            options and risks were discussed with the patient.                            All questions were answered, and informed consent                            was obtained. Prior Anticoagulants: The patient has  taken no anticoagulant or antiplatelet agents. ASA                            Grade Assessment: III - A patient with severe                            systemic disease. After reviewing the risks and                            benefits, the patient was deemed in satisfactory                            condition to undergo the procedure.                           After obtaining informed consent, the endoscope was                             passed under direct vision. Throughout the                            procedure, the patient's blood pressure, pulse, and                            oxygen saturations were monitored continuously. The                            Olympus Scope [id number] was introduced through the                            mouth, and advanced to the second part of duodenum.                            The upper GI endoscopy was accomplished without                            difficulty. The patient tolerated the procedure                            well. Shortly after conclusion of the procedure,                            the patient did develop a brief laryngospasm. This                            was quickly managed with increased supplemental                            oxygenation with positive pressure ventilation and                            jaw thrust, with rapid recovery of oxygen  saturation into the high 90's. Scope In: Scope Out: Findings:                 The upper and middle esophagus was normal.                           The Z-line was regular and was found 35 cm from the                            incisors.                           A 5 cm hiatal hernia was present.                           Scattered minimal inflammation characterized by                            erythema was found in the gastric body and in the                            gastric antrum. Biopsies were taken with a cold                            forceps for Helicobacter pylori testing. Estimated                            blood loss was minimal.                           The gastroesophageal flap valve was visualized                            endoscopically and classified as Hill Grade IV (no                            fold, wide open lumen, hiatal hernia present).                           The examined duodenum was normal. Complications:            No immediate complications. Estimated Blood  Loss:     Estimated blood loss was minimal. Impression:               - Normal esophagus.                           - Z-line regular, 35 cm from the incisors.                           - 5 cm hiatal hernia.                           - Gastritis. Biopsied.                           - Gastroesophageal flap valve  classified as Hill                            Grade IV (no fold, wide open lumen, hiatal hernia                            present).                           - Normal examined duodenum. Recommendation:           - Patient has a contact number available for                            emergencies. The signs and symptoms of potential                            delayed complications were discussed with the                            patient. Return to normal activities tomorrow.                            Written discharge instructions were provided to the                            patient.                           - Resume previous diet.                           - Start Dexilant 60 mg daily.                           - When starting Dexilant, can stop the Nexium.                           - Resume Pepcid.                           - Continue other present medications.                           - Await pathology results.                           - Return to GI clinic with Doug Sou or myself                            at appointment to be scheduled.                           - Suspect continued reflux and regurgitation is                            secondary to the large hiatal hernia and anatomic  dysfunction. Treatment options include changing                            acid suppression therapy (as above) and referral to                            surgery. Based on anatomy, best surgical option for                            refractory reflux would be hiatal hernia repair                            with Roux-en-Y gastric bypass. Doristine Locks,  MD 07/08/2022 10:37:41 AM

## 2022-07-08 NOTE — Progress Notes (Signed)
GASTROENTEROLOGY PROCEDURE H&P NOTE   Primary Care Physician: Erskine Emery, NP    Reason for Procedure:   GERD, hiatal hernia  Plan:    EGD  Patient is appropriate for endoscopic procedure(s) in the ambulatory ([facility]) setting.  The nature of the procedure, as well as the risks, benefits, and alternatives were carefully and thoroughly reviewed with the patient. Ample time for discussion and questions allowed. The patient understood, was satisfied, and agreed to proceed.     HPI: Allison Spencer is a 62 y.o. female who presents for EGD for evaluation of GERD with probable erosive esophagitis on prior EGD at [facility], with breakthrough reflux sxs despite Nexium 40 mg BID and famotidine 40 mg qhs.  Patient was most recently seen in the Gastroenterology Clinic on 06/25/2022.  No interval change in medical history since that appointment. Please refer to that note for full details regarding GI history and clinical presentation.   Past Medical History:  Diagnosis Date   Acquired hypothyroidism 04/26/2019   Allergy    Anxiety    Arthritis    Asthma    Back pain 02/03/2017   Cataract    Complication of anesthesia 01/10/2016   when [place] feels like lump in throat had endo for [place] feeling continues   Coronary artery disease involving native coronary artery of native heart without angina pectoris 09/08/2014   DDD (degenerative disc disease), cervical 03/03/2014   Depression    Diverticulitis    Elevated liver enzymes 04/05/2019   Endometrial cancer (HCC)    Essential hypertension    Exposure to TB    latent takes rifadin for 4 months for started nov 2017   Family history of brain cancer    Family history of breast cancer    Family history of colon cancer    Genetic testing 05/01/2016   Negative genetic testing on the TumorNext Lynch with CancerNext.  This is paired germline and tumor analyses for enhanced diagnosis of lynch syndrome plus analyses of 29 additional genes associated  with hereditary cancer.  The CancerNext gene panel offered by [employer] includes sequencing and rearrangement analysis for the following 34 genes:   APC, ATM, BARD1, BMPR1A, BRCA1, BRCA2, BRIP1, CD   GERD (gastroesophageal reflux disease)    Goals of care, counseling/discussion 03/02/2019   H. 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   Urinary incontinence     Past Surgical History:  Procedure Laterality Date   CHOLECYSTECTOMY  2000   COLECTOMY  2016   For diverticultis   colonscopy     ROBOTIC ASSISTED TOTAL HYSTERECTOMY WITH BILATERAL SALPINGO OOPHERECTOMY Bilateral 01/18/2016   Procedure: XI ROBOTIC ASSISTED TOTAL HYSTERECTOMY WITH BILATERAL SALPINGO OOPHORECTOMY WITH SENTINAL LYMPH NODE BIOPSY; LYSIS OF ADHESIONS;  Surgeon: Adolphus Birchwood, MD;  Location: [facility];  Service: Gynecology;  Laterality: Bilateral;   TUBAL LIGATION  1991   UPPER GI ENDOSCOPY  01/10/2016    Prior  to Admission medications   Medication Sig Start Date End Date Taking? Authorizing Provider  Cholecalciferol (VITAMIN D3) 1.25 MG (50000 UT) CAPS Take 1.25 mg by mouth once a week. 06/06/22  Yes [provider]  escitalopram (LEXAPRO) 20 MG tablet Take 30 mg by mouth daily. 10/24/15  Yes [provider]  esomeprazole (NEXIUM) 40 MG capsule Take 40 mg by mouth 2 (two) times daily. 05/13/20  Yes [provider]  famotidine (PEPCID) 40 MG tablet Take 40 mg by mouth daily.   Yes  [provider]  levothyroxine (SYNTHROID) 112 MCG tablet Take 112 mcg by mouth daily. 05/01/20  Yes [provider]  losartan (COZAAR) 50 MG tablet Take 1 tablet by mouth daily. 04/17/20  Yes [provider]  mirabegron ER (MYRBETRIQ) 50 MG TB24 tablet Take 1 tablet (50 mg total) by mouth daily. 11/16/21  Yes Marguerita Beards, MD  rosuvastatin (CRESTOR) 40 MG tablet Take 40 mg by mouth daily. 02/17/19  Yes [provider]  albuterol (VENTOLIN HFA) 108 (90 Base) MCG/ACT inhaler Inhale 1 puff into the lungs every 6 (six) hours as needed for wheezing or shortness of breath.    [provider]  cholestyramine (QUESTRAN) 4 g packet Take 1 packet (4 g total) by mouth daily. 06/25/22   Zehr, Princella Pellegrini, PA-C  QUEtiapine (SEROQUEL) 50 MG tablet Take 50 mg by mouth daily. Patient not taking: Reported on 07/08/2022 04/28/19   [provider]  Timmothy Sours 93 MCG/ACT EXHU Place 1 spray into both nostrils 2 (two) times daily. 08/28/20   [provider]    Current Outpatient Medications  Medication Sig Dispense Refill   Cholecalciferol (VITAMIN D3) 1.25 MG (50000 UT) CAPS Take 1.25 mg by mouth once a week.     escitalopram (LEXAPRO) 20 MG tablet Take 30 mg by mouth daily.  1   esomeprazole (NEXIUM) 40 MG capsule Take 40 mg by mouth 2 (two) times daily.     famotidine (PEPCID) 40 MG tablet Take 40 mg by mouth daily.     levothyroxine (SYNTHROID) 112 MCG tablet Take 112 mcg by mouth daily.     losartan (COZAAR) 50 MG tablet Take 1 tablet by mouth daily.     mirabegron ER (MYRBETRIQ) 50 MG TB24 tablet Take 1 tablet (50 mg total) by mouth daily. 30 tablet 5   rosuvastatin (CRESTOR) 40 MG tablet Take 40 mg by mouth daily.     albuterol (VENTOLIN HFA) 108 (90 Base) MCG/ACT inhaler Inhale 1 puff into the lungs every 6 (six) hours as needed for wheezing or shortness of breath.     cholestyramine (QUESTRAN) 4 g packet Take 1 packet (4 g total) by mouth daily. 30  packet 5   QUEtiapine (SEROQUEL) 50 MG tablet Take 50 mg by mouth daily. (Patient not taking: Reported on 07/08/2022)     XHANCE 93 MCG/ACT EXHU Place 1 spray into both nostrils 2 (two) times daily.     Current Facility-Administered Medications  Medication Dose Route Frequency Provider Last Rate Last Admin   0.9 %  sodium chloride infusion  500 mL Intravenous Once Patience Nuzzo V, DO        Allergies as of 07/08/2022 - Review Complete 07/08/2022  Allergen Reaction Noted   Codeine Nausea And Vomiting 09/08/2014   Other  05/31/2019   Sulfa antibiotics Nausea And Vomiting 09/08/2014    Family History  Problem Relation Age of Onset   COPD Mother    CAD Father  Pulmonary embolism Sister    Heart attack Brother    COPD Brother    Cancer Maternal Aunt        lung ca   Cancer Maternal Uncle        brain ca   Colon cancer Maternal Uncle    Breast cancer Maternal Grandmother    Cancer Maternal Grandmother 55       breast ca   Brain cancer Cousin        dx under 50   Ovarian cancer Neg Hx    Endometrial cancer Neg Hx    Pancreatic cancer Neg Hx    Prostate cancer Neg Hx    Esophageal cancer Neg Hx    Stomach cancer Neg Hx    Rectal cancer Neg Hx     Social History   Socioeconomic History   Marital status: Married    Spouse name: Richard   Number of children: 3   Years of education: Not on file   Highest education level: Not on file  Occupational History   Occupation: Non-working, applying disability  Tobacco Use   Smoking status: Former    Packs/day: 1.00    Years: 10.00    Additional pack years: 0.00    Total pack years: 10.00    Types: Cigarettes    Quit date: 12/05/1988    Years since quitting: 33.6   Smokeless tobacco: Never  Vaping Use   Vaping Use: Never used  Substance and Sexual Activity   Alcohol use: No   Drug use: No   Sexual activity: Yes    Partners: Male  Other Topics Concern   Not on file  Social History Narrative   Taking care of her  grandson who has autism, 85 years old   Social Determinants of Corporate investment banker Strain: Not on file  Food Insecurity: Not on file  Transportation Needs: Not on file  Physical Activity: Not on file  Stress: Not on file  Social Connections: Not on file  Intimate Partner Violence: Not on file    Physical Exam: Vital signs in last 24 hours: @BP  (!) 137/96   Pulse 72   Temp 97.7 F (36.5 C)   Ht 5\' 2"  (1.575 m)   Wt 231 lb (104.8 kg)   SpO2 97%   BMI 42.25 kg/m  GEN: NAD EYE: Sclerae anicteric ENT: MMM CV: Non-tachycardic Pulm: CTA b/l GI: Soft, NT/ND NEURO:  Alert & Oriented x 3   Doristine Locks, DO [facility]   07/08/2022 9:41 AM

## 2022-07-08 NOTE — Progress Notes (Signed)
Vss nad trans to pacu  Pt laryngospasmed and needed suction and O2 masking with Ambu bag for a brief time.  Sats recovered back to baseline (mid 90's)  Pt stated that last EGD procedure she needed masking as well.  Pt stated that her sleep apnea was bad.

## 2022-07-08 NOTE — Telephone Encounter (Signed)
Follow up appointment is scheduled with Doug Sou, PA on 10/16/22 at 9:00 AM. AMB referral is placed to Dr. Andrey Campanile for consideration of hiatal hernia repair and Roux-en-Y gastric bypass for refractory reflux. Sent detail message to the patient via MyChart.

## 2022-07-08 NOTE — Progress Notes (Signed)
Called to room to assist during endoscopic procedure.  Patient ID and intended procedure confirmed with present staff. Received instructions for my participation in the procedure from the performing physician.  

## 2022-07-08 NOTE — Patient Instructions (Signed)
Please read handouts provided. Resume previous diet. Begin Dexilant 60 mg daily, can stop Nexium. Resume Pepcid. Await pathology results. Continue other present medications. Return to GI clinic, nurse to call and schedule.    YOU HAD AN ENDOSCOPIC PROCEDURE TODAY AT THE [facility]:   Refer to the procedure report that was given to you for any specific questions about what was found during the examination.  If the procedure report does not answer your questions, please call your gastroenterologist to clarify.  If you requested that your care partner not be given the details of your procedure findings, then the procedure report has been included in a sealed envelope for you to review at your convenience later.  YOU SHOULD EXPECT: Some feelings of bloating in the abdomen. Passage of more gas than usual.  Walking can help get rid of the air that was put into your GI tract during the procedure and reduce the bloating. If you had a lower endoscopy (such as a colonoscopy or flexible sigmoidoscopy) you may notice spotting of blood in your stool or on the toilet paper. If you underwent a bowel prep for your procedure, you may not have a normal bowel movement for a few days.  Please Note:  You might notice some irritation and congestion in your nose or some drainage.  This is from the oxygen used during your procedure.  There is no need for concern and it should clear up in a day or so.  SYMPTOMS TO REPORT IMMEDIATELY:  Following upper endoscopy (EGD)  Vomiting of blood or coffee ground material  New chest pain or pain under the shoulder blades  Painful or persistently difficult swallowing  New shortness of breath  Fever of 100F or higher  Black, tarry-looking stools  For urgent or emergent issues, a gastroenterologist can be reached at any hour by calling [phone]. Do not use MyChart messaging for urgent concerns.    DIET:  We do recommend a small meal at first, but then you  may proceed to your regular diet.  Drink plenty of fluids but you should avoid alcoholic beverages for 24 hours.  ACTIVITY:  You should plan to take it easy for the rest of today and you should NOT DRIVE or use heavy machinery until tomorrow (because of the sedation medicines used during the test).    FOLLOW UP: Our staff will call the number listed on your records the next business day following your procedure.  We will call around 7:15- 8:00 am to check on you and address any questions or concerns that you may have regarding the information given to you following your procedure. If we do not reach you, we will leave a message.     If any biopsies were taken you will be contacted by phone or by letter within the next 1-3 weeks.  Please call us at [phone] if you have not heard about the biopsies in 3 weeks.    SIGNATURES/CONFIDENTIALITY: You and/or your care partner have signed paperwork which will be entered into your electronic medical record.  These signatures attest to the fact that that the information above on your After Visit Summary has been reviewed and is understood.  Full responsibility of the confidentiality of this discharge information lies with you and/or your care-partner.

## 2022-07-08 NOTE — Telephone Encounter (Signed)
-----   Message from [facility] V, DO sent at 07/08/2022 10:48 AM EDT ----- Can you please set this patient up for referral to Dr. Andrey Campanile for consideration of hiatal hernia repair and Roux-en-Y gastric bypass for refractory reflux? This would not be a TIF candidate.   Also needs a f/u appt with me or Doug Sou in 3-4 months.   Thanks!

## 2022-07-09 ENCOUNTER — Telehealth: Payer: Self-pay

## 2022-07-09 NOTE — Telephone Encounter (Signed)
Attempted to reach patient for post-procedure f/u call. No answer. Left message for her to please not hesitate to call if she has any questions/concerns regarding her care. 

## 2022-10-16 ENCOUNTER — Ambulatory Visit: Payer: 59 | Admitting: Gastroenterology

## 2022-11-21 ENCOUNTER — Telehealth: Payer: Self-pay

## 2022-11-21 NOTE — Telephone Encounter (Signed)
Pt called office to reschedule her appointment for tomorrow 11/22/22. Next available with Dr.Tucker is 12/27/22 @ 8:00a, Time ok per Marcelino Duster (admin Asst). Pt agreed to date/time with arrival time of 7:45

## 2022-11-22 ENCOUNTER — Inpatient Hospital Stay: Payer: 59 | Admitting: Gynecologic Oncology

## 2022-11-22 DIAGNOSIS — C541 Malignant neoplasm of endometrium: Secondary | ICD-10-CM

## 2022-12-18 ENCOUNTER — Other Ambulatory Visit: Payer: Self-pay | Admitting: Gastroenterology

## 2022-12-27 ENCOUNTER — Inpatient Hospital Stay: Payer: 59 | Attending: Gynecologic Oncology | Admitting: Gynecologic Oncology

## 2022-12-27 ENCOUNTER — Encounter: Payer: Self-pay | Admitting: Gynecologic Oncology

## 2022-12-27 VITALS — BP 129/76 | HR 72 | Temp 98.8°F | Resp 18 | Ht 62.0 in | Wt 235.0 lb

## 2022-12-27 DIAGNOSIS — Z9221 Personal history of antineoplastic chemotherapy: Secondary | ICD-10-CM | POA: Insufficient documentation

## 2022-12-27 DIAGNOSIS — Z808 Family history of malignant neoplasm of other organs or systems: Secondary | ICD-10-CM | POA: Diagnosis not present

## 2022-12-27 DIAGNOSIS — Z801 Family history of malignant neoplasm of trachea, bronchus and lung: Secondary | ICD-10-CM | POA: Diagnosis not present

## 2022-12-27 DIAGNOSIS — Z8542 Personal history of malignant neoplasm of other parts of uterus: Secondary | ICD-10-CM | POA: Diagnosis present

## 2022-12-27 DIAGNOSIS — C541 Malignant neoplasm of endometrium: Secondary | ICD-10-CM

## 2022-12-27 DIAGNOSIS — Z87891 Personal history of nicotine dependence: Secondary | ICD-10-CM | POA: Insufficient documentation

## 2022-12-27 DIAGNOSIS — Z800 Family history of malignant neoplasm of digestive organs: Secondary | ICD-10-CM | POA: Diagnosis not present

## 2022-12-27 DIAGNOSIS — R110 Nausea: Secondary | ICD-10-CM | POA: Insufficient documentation

## 2022-12-27 DIAGNOSIS — M545 Low back pain, unspecified: Secondary | ICD-10-CM

## 2022-12-27 DIAGNOSIS — Z923 Personal history of irradiation: Secondary | ICD-10-CM | POA: Insufficient documentation

## 2022-12-27 DIAGNOSIS — M549 Dorsalgia, unspecified: Secondary | ICD-10-CM | POA: Diagnosis not present

## 2022-12-27 NOTE — Progress Notes (Signed)
Gynecologic Oncology Return Clinic Visit  12/27/22  Reason for Visit: follow-up  Treatment History: Oncology History Overview Note  MSI high disease detected   Endometrial cancer (HCC)  07/05/2015 Pathology Results   She had abnormal PAP   11/24/2015 Procedure   She underwent endometrial sampling that came back abnormal   01/10/2016 Procedure   She had EGD which showed esophagitis   01/18/2016 Pathology Results   Diagnosis 1. Lymph node, sentinel, biopsy, right obturator - MICROMETASTASIS IN ONE OF ONE LYMPH NODES (1/1). 2. Lymph node, sentinel, biopsy, left external - ONE OF ONE LYMPH NODES NEGATIVE FOR CARCINOMA (0/1). 3. Lymph node, sentinel, biopsy, left obturator - MICROMETASTASIS IN ONE OF ONE LYMPH NODES (1/1). 4. Uterus +/- tubes/ovaries, neoplastic - UTERUS: -ENDOMYOMETRIUM: ENDOMETRIOID ADENOCARCINOMA, FIGO GRADE 1, SPANNING 3.3 CM. TUMOR INVADES LESS THAN 1/2 OF THE MYOMETRIUM. SEE ONCOLOGY TABLE. -SEROSA: UNREMARKABLE. NO MALIGNANCY IDENTIFIED. - CERVIX: BENIGN SQUAMOUS AND ENDOCERVICAL MUCOSA. NO DYSPLASIA OR MALIGNANCY. - BILATERAL OVARIES: INCLUSION CYSTS. NO MALIGNANCY. - BILATERAL FALLOPIAN TUBES: UNREMARKABLE RIGHT TUBE. LEFT TUBE IS NOT IDENTIFIED GROSSLY OR MICROSCOPICALLY.   01/18/2016 Surgery   She underwent robotic-assisted laparoscopic total hysterectomy with bilateral salpingoophorectomy, sentinel lymph node biopsy, lysis of adhesions   04/24/2016 Genetic Testing   Negative genetic testing on the TumorNext Lynch with CancerNext.  This is paired germline and tumor analyses for enhanced diagnosis of lynch syndrome plus analyses of 29 additional genes associated with hereditary cancer.  The CancerNext gene panel offered by [employer] includes sequencing and rearrangement analysis for the following 34 genes:   APC, ATM, BARD1, BMPR1A, BRCA1, BRCA2, BRIP1, CDH1, CDK4, CDKN2A, CHEK2, DICER, HOXB13, EPCAM, GREM1, MLH1, MRE11A, MSH2, MSH6, MUTYH, NBN, NF1,  PALB2, PMS2, POLD1, POLE, PTEN, RAD50, RAD51C, RAD51D, SMAD4, SMARCA4, STK11, and TP53.   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  Taken with the germline results demonstrating absence of pathogenic mutations or likely pathogenic variants (VLPs) in the mismatch repair (MMR) genes, the likelihood that this individual has Lynch syndrome/HNPCC is greatly decreased; however, the possibility of an undetected variant of either germline or somatic origin due to mosaicism and other rare etiologies cannot be ruled out by the current methodology.  Correlation with clinical history and external tumor testing results is advised.    10/29/2018 PET scan   Outside PET scan showed enlarged aorta-caval LN   12/17/2018 - 01/15/2019 Radiation Therapy   Radiation Treatment Dates: 12/17/2018 through 01/15/2019 Site Technique Total Dose (Gy) Dose per Fx (Gy) Completed Fx Beam Energies  Abdomen: Abd_PA node 3D 50/50 2.5 20/20 6X, 15X        03/01/2019 Imaging   1. No CT findings for metastatic disease involving the chest, abdomen or pelvis. 2. Stable moderate-sized hiatal hernia. 3. Status post cholecystectomy. No biliary dilatation. 4. Stable hepatic cysts.   03/15/2019 - 07/20/2019 Chemotherapy   Patient is on Treatment Plan : UTERINE Pembrolizumab q21d     06/14/2019 Imaging   1. No acute findings within the abdomen or pelvis. No findings of recurrent tumor or metastatic disease. 2. Hiatal hernia    She was for a routine pap smear by her PCP, Mauricio Po, FNP on 07/05/15. This revealed atypical glandular cells - endometrial. She was then seen by Dr Cyndie Chime on 11/24/15 and reported a single episode of light pink spotting but otherwise no bleeding. On 11/24/15 Dr Cyndie Chime performed an endometrial biopsy which revealed well differentiated (FIGO grade 1) endometrioid endometrial cancer. On 01/18/16 she  underwent robotic assisted total hysterectomy, BSO, and SLN biopsy.  Surgery was uncomplicated and there were no suspicious findings intraoperatively.   Final pathology revealed a stage IIIC1 endometrioid endometrial adenocarcinoma with a 3.3cm tumor invading <50% (1cm of 2.4cm) with no LVSI. However, bilateral obturator SLN's revealed micrometastases on both H&E and IHC.   Postoperative imaging (including CT abdo/pelvis and c  hest x ray) revealed no enlarged nodes, residual disease or chest disease.   The patient was recommended to received 6 cycles of adjuvant carboplatin and paclitaxel in accordance with NCCN guidelines. She has met with medical oncology, Dr Artis Delay, but declined adjuvant chemotherapy as she was concerned about immunity. She elected for an "anti-cancer" diet.   She was tested for Lynch syndrome (her tumor is MSI positive with loss of MLH1 expression), however, Lynch syndrome testing was negative.   In 2019 she reported + radicular pain down left leg. She has a known disc prolapse and is seeing an orthopedic surgeon. Repeat CT scan on 05/26/17 showed no evidence of recurrence including no adenopathy in retroperitoneal region, and no explanation for her pain.    She had had a persistent cough since October, 2019. CT chest in October, 2019 showed a tiny subpleural nodule in the anterior aspect of the left lower lobe abutting the major fissure. Annual follow-up was recommended, though it was suspected to be benign. CXR on 02/03/17 showed normal heart and lungs.    In 2020 she reported progressive low back pain, sciatic pain and neck pains. Due to her back pain a CT chest abdomen pelvis was performed at [facility] on October 06, 2018. CT of the abdomen and pelvis revealed a 9 mm short axis right periaortic node that was new and suspicious for recurrent endometrial cancer.  There was no other gross abdominal or retroperitoneal recurrence.   She received pembrolizumab for salvage therapy between 03/15/19 and 07/20/19. She also received salvage  radiation (50 Gy in 20 fractions to the PA nodal region) with Dr Roselind Messier between 12/17/18 through 01/15/19.   Post-treatment imaging on 06/14/19 showed no evidence of recurrence/progression.   She developed cough/respiratory symptoms and was seen by pulmonology. It was not felt that her symptoms were secondary to [facility] (eg pneumonitis). The patient self-discontinued therapy in July, 2021 (plan had been for ongoing Keytruda) and did not return for follow-up for approximately 1 year.   CT abd/pelvis (surveillance) in June, 2022 was performed at [facility] and showed no evidence of persistent/recurrent disease.    Interval History: Overall doing well although notes about 1 month of worsening low back pain in the midline and left.  She denies any radiation down her left leg but does have some intermittent radiation of pain down her right leg.  She has pain when she wakes up but pain worsens during the day with walking and movement.  Had previously seen somebody for back pain and was told she had a bulging disc that was inoperable.  Endorses a good appetite.  Has occasional left-sided pelvic pain.  Has had some intermittent nausea for about 6 months, denies any emesis.  Reports baseline bowel function with IBS which alternates between diarrhea and constipation.  Bladder symptoms are overall still improved on Myrbetriq but has had some feelings of incomplete emptying recently.  Had labs done at her primary care providers earlier this week.  Past Medical/Surgical History: Past Medical History:  Diagnosis Date   Acquired hypothyroidism 04/26/2019   Allergy    Anxiety    Arthritis  Asthma    Back pain 02/03/2017   Cataract    Complication of anesthesia 01/10/2016   when [place] feels like lump in throat had endo for [place] feeling continues   Coronary artery disease involving native coronary artery of native heart without angina pectoris 09/08/2014   DDD (degenerative disc disease), cervical 03/03/2014    Depression    Diverticulitis    Elevated liver enzymes 04/05/2019   Endometrial cancer (HCC)    Essential hypertension    Exposure to TB    latent takes rifadin for 4 months for started nov 2017   Family history of brain cancer    Family history of breast cancer    Family history of colon cancer    Genetic testing 05/01/2016   Negative genetic testing on the TumorNext Lynch with CancerNext.  This is paired germline and tumor analyses for enhanced diagnosis of lynch syndrome plus analyses of 29 additional genes associated with hereditary cancer.  The CancerNext gene panel offered by [employer] includes sequencing and rearrangement analysis for the following 34 genes:   APC, ATM, BARD1, BMPR1A, BRCA1, BRCA2, BRIP1, CD   GERD (gastroesophageal reflux disease)    Goals of care, counseling/discussion 03/02/2019   H. 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   Urinary incontinence     Past Surgical History:  Procedure Laterality Date   CHOLECYSTECTOMY  2000   COLECTOMY  2016   For diverticultis   colonscopy     ROBOTIC ASSISTED TOTAL HYSTERECTOMY WITH BILATERAL SALPINGO OOPHERECTOMY Bilateral 01/18/2016   Procedure: XI ROBOTIC ASSISTED TOTAL HYSTERECTOMY WITH BILATERAL SALPINGO OOPHORECTOMY WITH SENTINAL LYMPH NODE BIOPSY; LYSIS OF ADHESIONS;  Surgeon: Adolphus Birchwood, MD;  Location: [facility];  Service: Gynecology;  Laterality: Bilateral;   TUBAL LIGATION  1991   UPPER GI  ENDOSCOPY  01/10/2016    Family History  Problem Relation Age of Onset   COPD Mother    CAD Father    Pulmonary embolism Sister    Heart attack Brother    COPD Brother    Cancer Maternal Aunt        lung ca   Cancer Maternal Uncle        brain ca   Colon cancer Maternal Uncle    Breast cancer Maternal Grandmother    Cancer Maternal Grandmother 67       breast ca   Brain cancer Cousin        dx under 50   Ovarian cancer Neg Hx    Endometrial cancer Neg Hx    Pancreatic cancer Neg Hx    Prostate cancer Neg Hx    Esophageal cancer Neg Hx    Stomach cancer Neg Hx    Rectal cancer Neg Hx     Social History   Socioeconomic History   Marital status: Married    Spouse name: Richard   Number of children: 3   Years of education: Not on  file   Highest education level: Not on file  Occupational History   Occupation: Non-working, applying disability  Tobacco Use   Smoking status: Former    Current packs/day: 0.00    Average packs/day: 1 pack/day for 10.0 years (10.0 ttl pk-yrs)    Types: Cigarettes    Start date: 12/06/1978    Quit date: 12/05/1988    Years since quitting: 34.0   Smokeless tobacco: Never  Vaping Use   Vaping status: Never Used  Substance and Sexual Activity   Alcohol use: No   Drug use: No   Sexual activity: Yes    Partners: Male  Other Topics Concern   Not on file  Social History Narrative   Taking care of her grandson who has autism, 36 years old   Social Drivers of Corporate investment banker Strain: Not on file  Food Insecurity: Not on file  Transportation Needs: Not on file  Physical Activity: Not on file  Stress: Not on file  Social Connections: Not on file    Current Medications:  Current Outpatient Medications:    albuterol (VENTOLIN HFA) 108 (90 Base) MCG/ACT inhaler, Inhale 1 puff into the lungs every 6 (six) hours as needed for wheezing or shortness of breath., Disp: , Rfl:    cholestyramine (QUESTRAN) 4 g packet, TAKE 1 PACKET  BY MOUTH DAILY., Disp: 90 packet, Rfl: 1   dexlansoprazole (DEXILANT) 60 MG capsule, Take 1 capsule (60 mg total) by mouth daily., Disp: 60 capsule, Rfl: 3   escitalopram (LEXAPRO) 20 MG tablet, Take 30 mg by mouth daily., Disp: , Rfl: 1   famotidine (PEPCID) 40 MG tablet, Take 40 mg by mouth daily., Disp: , Rfl:    levothyroxine (SYNTHROID) 112 MCG tablet, Take 112 mcg by mouth daily., Disp: , Rfl:    losartan (COZAAR) 50 MG tablet, Take 1 tablet by mouth daily., Disp: , Rfl:    mirabegron ER (MYRBETRIQ) 50 MG TB24 tablet, Take 1 tablet (50 mg total) by mouth daily., Disp: 30 tablet, Rfl: 5   predniSONE (DELTASONE) 10 MG tablet, Take by mouth., Disp: , Rfl:    QUEtiapine (SEROQUEL) 50 MG tablet, Take 50 mg by mouth daily., Disp: , Rfl:    rosuvastatin (CRESTOR) 40 MG tablet, Take 40 mg by mouth daily., Disp: , Rfl:    XHANCE 93 MCG/ACT EXHU, Place 1 spray into both nostrils 2 (two) times daily., Disp: , Rfl:   Review of Systems: + back pain, anxiety, depression Denies appetite changes, fevers, chills, fatigue, unexplained weight changes. Denies hearing loss, neck lumps or masses, mouth sores, ringing in ears or voice changes. Denies cough or wheezing.  Denies shortness of breath. Denies chest pain or palpitations. Denies leg swelling. Denies abdominal distention, pain, blood in stools, constipation, diarrhea, nausea, vomiting, or early satiety. Denies pain with intercourse, dysuria, frequency, hematuria or incontinence. Denies hot flashes, pelvic pain, vaginal bleeding or vaginal discharge.   Denies joint pain or muscle pain/cramps. Denies itching, rash, or wounds. Denies dizziness, headaches, numbness or seizures. Denies swollen lymph nodes or glands, denies easy bruising or bleeding. Denies confusion, or decreased concentration.  Physical Exam: BP 129/76 (BP Location: Left Arm, Patient Position: Sitting)   Pulse 72   Temp 98.8 F (37.1 C) (Oral)   Resp 18   Ht 5\' 2"  (1.575 m)    Wt 235 lb (106.6 kg)   SpO2 97%   BMI 42.98 kg/m  General: Alert, oriented, no acute distress. HEENT: Normocephalic, atraumatic, sclera anicteric.  Chest: Clear to auscultation bilaterally.  Minimal expiratory wheezing bilaterally with some breaths. Cardiovascular: Regular rate and rhythm, no murmurs. Abdomen: Obese, soft, nontender.  Normoactive bowel sounds.  No masses or hepatosplenomegaly appreciated.  Well-healed incisions. Back: Tenderness with palpation along the left low back. Extremities: Grossly normal range of motion.  Warm, well perfused.  No edema bilaterally. Skin: No rashes or lesions noted. Lymphatics: No cervical, supraclavicular, or inguinal adenopathy. GU: Normal appearing external genitalia without erythema, excoriation, or lesions.  Speculum exam reveals moderately atrophic vaginal mucosa, no lesions noted, no bleeding or discharge.  Bimanual exam reveals cuff intact, smooth, no nodularity.  Rectovaginal exam confirms these findings, no masses.  Laboratory & Radiologic Studies: None new  Assessment & Plan: MYLAH KRATT is a 62 y.o. woman with recurrent grade 1 endometrioid endometrial cancer diagnosed in January, 2018, recurrence diagnosed 10/06/18. 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  S/p salvage therapy with pembrolizumab and radiation to para-aortic nodes.  Complete clinical and radiographic response, completed in mid 2021. Last imaging 07/2021 was negative for recurrent disease.   Patient is overall doing well and is NED on exam today.  Given worsening back pain as well as nausea, discussed getting CT scan to rule out recurrent disease.  My suspicion is that her back pain is musculoskeletal or spinal in nature given her history of bulging disc as well as point tenderness on palpation.    Patient has a history of overactive  bladder and is on Myrbetriq. Follows with Dr. Florian Buff.  Is doing much better from a symptom standpoint.  Given her urinary symptoms, urine test done with her primary care provider this week.  I encouraged the patient to call her PCPs office for these results and any indicated follow-up or treatment.   Per NCCN surveillance recommendations, we will continue with surveillance visits every 6 months.  We reviewed signs and symptoms that should prompt a phone call before her next scheduled visit.    22 minutes of total time was spent for this patient encounter, including preparation, face-to-face counseling with the patient and coordination of care, and documentation of the encounter.  Eugene Garnet, MD  Division of Gynecologic Oncology  Department of Obstetrics and Gynecology  [facility]

## 2022-12-27 NOTE — Patient Instructions (Signed)
It was good to see you today.  I do not feel or see any evidence of recurrence on your exam.  Given your back pain, we will get a CT scan.  Will let you know once I have these results.  We will tentatively plan on a follow-up visit with me in 6 months.  If you develop new and concerning symptoms before your next visit, please call to see me sooner.

## 2022-12-30 ENCOUNTER — Telehealth: Payer: Self-pay | Admitting: *Deleted

## 2022-12-30 NOTE — Telephone Encounter (Signed)
Per Dr Pricilla Holm patient scheduled for a CT scan at [facility] for 1/3 at 11 am. [facility] for the patient to call the office back, also sent the patient a my chart message

## 2023-01-10 ENCOUNTER — Inpatient Hospital Stay
Admission: RE | Admit: 2023-01-10 | Discharge: 2023-01-10 | Disposition: A | Discharge status: Home / Self Care | Payer: Self-pay | Source: Ambulatory Visit | Attending: Gynecologic Oncology | Admitting: Gynecologic Oncology

## 2023-01-10 ENCOUNTER — Telehealth: Payer: Self-pay | Admitting: Gynecologic Oncology

## 2023-01-10 ENCOUNTER — Other Ambulatory Visit: Payer: Self-pay

## 2023-01-10 ENCOUNTER — Telehealth: Payer: Self-pay | Admitting: *Deleted

## 2023-01-10 DIAGNOSIS — M899 Disorder of bone, unspecified: Secondary | ICD-10-CM

## 2023-01-10 DIAGNOSIS — M545 Low back pain, unspecified: Secondary | ICD-10-CM

## 2023-01-10 DIAGNOSIS — C541 Malignant neoplasm of endometrium: Secondary | ICD-10-CM

## 2023-01-10 NOTE — Telephone Encounter (Signed)
 Patient returning a call from Dr. Viktoria. Spoke to patient and relayed message from Dr. Viktoria that she reviewed recent findings of CT abdomen/pelvis performed at [facility]. This showed moderate size hiatal hernia, this maybe related to your nausea and may want to follow up with a surgeon about this.  Fatty infiltration of the liver with a 16 mm benign hepatic cyst. Evaluation of bowel loops limited due to lack of oral contrast, no obstruction or acute appendicitis seen. No free fluid or adenopathy. There is a subtle lesion in the posterior body of L3 that may be degenerative although follow- up MRI or bone scan recommended to exclude malignancy.  Dr. Viktoria recommends a bone scan to assure that the L3 vertebral body lesion is not something other than degenerative disease. Bone scan as been ordered.  Pt verbalized understanding and pt request to have bone scan done at [facility]. Advised patient that the office would call back once we have this scheduled. Pt thanked the office for calling.

## 2023-01-10 NOTE — Telephone Encounter (Signed)
 I called the patient to review recent findings of CT abdomen/pelvis performed at [facility].  This showed moderate size hiatal hernia.  Fatty infiltration of the liver with a 16 mm benign hepatic cyst.  Evaluation of bowel loops limited due to lack of oral contrast, no obstruction or acute appendicitis seen.  No free fluid or adenopathy.  There is a subtle lesion in the posterior body of L3 that may be degenerative although follow-up MRI or bone scan recommended to exclude malignancy.  If the patient calls back, please relay the above information to her.  I would recommend that we get a bone scan to assure that the L3 vertebral body lesion is not something other than degenerative disease.  Bone scan has been ordered.  Please schedule this when patient calls back.  Comer Dollar MD Gynecologic Oncology

## 2023-01-13 NOTE — Telephone Encounter (Signed)
 Patient called back and I gave her the appointment time and date for the bone density scan.  1/16 @ 10:30am and she needs to check in at 10:00am.  Patient confirmed and understood.  This is scheduled at [facility]

## 2023-01-13 NOTE — Telephone Encounter (Signed)
 Attempted to reach patient in regards to her scheduled Bone Scan on Thursday, January 16 th at 1030 with check in time of 1000. Left voicemail requesting call back.

## 2023-01-14 ENCOUNTER — Telehealth: Payer: Self-pay | Admitting: *Deleted

## 2023-01-14 NOTE — Telephone Encounter (Signed)
 Spoke with patient this morning to reiterate her appointment for NM Bone Scan Whole Body with [facility] scheduled for 01/23/23 at 1030 with check in time of 1000. Pt advised that they will give her injection at 1030 and then she will have a 3 hour window where it's encouraged to drink 40 oz. Of water . Pt doesn't have to stay at the facility for the 3 hours, she can leave and then come back. Pt verbalized understanding and thanked the office for calling.

## 2023-01-17 ENCOUNTER — Telehealth: Payer: Self-pay | Admitting: *Deleted

## 2023-01-17 NOTE — Telephone Encounter (Signed)
 Order for Bone Scan faxed to Radiology at [facility] # [phone] attn: Ander Slade

## 2023-01-24 ENCOUNTER — Other Ambulatory Visit: Payer: Self-pay | Admitting: General Surgery

## 2023-01-24 DIAGNOSIS — K219 Gastro-esophageal reflux disease without esophagitis: Secondary | ICD-10-CM

## 2023-01-28 ENCOUNTER — Telehealth: Payer: Self-pay | Admitting: *Deleted

## 2023-01-28 NOTE — Telephone Encounter (Signed)
Spoke with Ms. Chestnut and relayed message from Warner Mccreedy, NP that pt's bone scan is showing arthritis changes and no evidence of cancer spread to the bones. Pt verbalized understanding and thanked the office for calling and states she will call the orthopedic MD because she is still having problems with her back. Advised pt to call the orthopedic MD and call our office with any concerns or questions.

## 2023-01-28 NOTE — Telephone Encounter (Signed)
-----   Message from Doylene Bode sent at 01/28/2023 11:02 AM EST ----- Please let her know her bone scan is showing arthritis changes. No evidence of cancer spread to the bones.

## 2023-01-31 ENCOUNTER — Encounter: Payer: Self-pay | Admitting: Gynecologic Oncology

## 2023-02-10 ENCOUNTER — Telehealth: Payer: 59 | Admitting: Obstetrics and Gynecology

## 2023-02-10 ENCOUNTER — Ambulatory Visit: Payer: 59 | Admitting: Obstetrics and Gynecology

## 2023-02-10 ENCOUNTER — Encounter: Payer: Self-pay | Admitting: Obstetrics and Gynecology

## 2023-02-10 DIAGNOSIS — N3281 Overactive bladder: Secondary | ICD-10-CM | POA: Diagnosis not present

## 2023-02-10 DIAGNOSIS — R350 Frequency of micturition: Secondary | ICD-10-CM

## 2023-02-10 MED ORDER — MIRABEGRON ER 50 MG PO TB24: 50.0000 mg | ORAL_TABLET | Freq: Every day | ORAL | 3 refills | Status: DC

## 2023-02-10 NOTE — Progress Notes (Signed)
Virtual Visit via Video Note  I connected with Allison Spencer on 02/10/23 at  9:20 AM EST by a video enabled telemedicine application and verified that I am speaking with the correct person using two identifiers.  Location: Patient: In her home with her husband  Provider: In office with closed door and headset on.      I discussed the limitations of evaluation and management by telemedicine and the availability of in person appointments. The patient expressed understanding and agreed to proceed.  History of Present Illness:  Patient's appointment changed to virtual as her husband recently had Major surgery and she feels unsafe leaving him alone.    Patient has been on Myrbetriq 50mg  since November 2023. She reports she is doing well overall and urinates approximately 5-7 times during the day and is up 1-2 times at night to urinate. She denies UTI symptoms.     Observations/Objective: Patient is alert, well nourished and speaking in complete sentences.    Assessment and Plan:  Patient to continue on Myrbetriq 50mg  daily. Encouraged her to call the office with any concerns for a UTI or if the medication was not working effectively. She agrees with this plan of care.    Follow Up Instructions:    I discussed the assessment and treatment plan with the patient. The patient was provided an opportunity to ask questions and all were answered. The patient agreed with the plan and demonstrated an understanding of the instructions.   The patient was advised to call back or seek an in-person evaluation if the symptoms worsen or if the condition fails to improve as anticipated.  I provided 5 minutes of non-face-to-face time during this encounter. Additional time was spent reviewing patient's chart to prepare for discussion. Refill of medication provided today.    Selmer Dominion, NP

## 2023-03-20 ENCOUNTER — Encounter: Payer: Self-pay | Admitting: Gynecologic Oncology

## 2023-04-01 ENCOUNTER — Telehealth: Payer: Self-pay

## 2023-04-01 DIAGNOSIS — N3281 Overactive bladder: Secondary | ICD-10-CM

## 2023-04-01 MED ORDER — TROSPIUM CHLORIDE 20 MG PO TABS: 20.0000 mg | ORAL_TABLET | Freq: Two times a day (BID) | ORAL | 11 refills | Status: DC

## 2023-04-01 NOTE — Telephone Encounter (Signed)
 Allison Spencer is a 63 y.o. female called in saying that her Myrbetriq is not covered by her insurance anymore. Pt is requesting an alternative.   Pt has tried:  Oxybutynin 01/2017 Vesicare  07/2021- Stopped working

## 2023-04-01 NOTE — Addendum Note (Signed)
 Addended by: Marguerita Beards on: 04/01/2023 02:43 PM   Modules accepted: Orders

## 2023-04-01 NOTE — Telephone Encounter (Signed)
 Trospium 20mg  BID sent to pharmacy since Myrbetriq no longer covered.

## 2023-04-24 ENCOUNTER — Other Ambulatory Visit: Payer: Self-pay | Admitting: Gastroenterology

## 2023-05-08 MED ORDER — TROSPIUM CHLORIDE 20 MG PO TABS: 20.0000 mg | ORAL_TABLET | Freq: Two times a day (BID) | ORAL | 0 refills | Status: AC

## 2023-05-08 NOTE — Addendum Note (Signed)
 Addended by: Greggory Safranek G on: 05/08/2023 03:29 PM   Modules accepted: Orders

## 2023-06-13 ENCOUNTER — Telehealth: Admitting: Obstetrics and Gynecology

## 2023-06-13 ENCOUNTER — Encounter: Payer: Self-pay | Admitting: Obstetrics and Gynecology

## 2023-06-13 DIAGNOSIS — R682 Dry mouth, unspecified: Secondary | ICD-10-CM

## 2023-06-13 DIAGNOSIS — N3281 Overactive bladder: Secondary | ICD-10-CM | POA: Diagnosis not present

## 2023-06-13 MED ORDER — BIOTENE DRY MOUTH MT LIQD: 15.0000 mL | OROMUCOSAL | Status: AC | PRN

## 2023-06-13 NOTE — Progress Notes (Signed)
 Virtual Visit via Video Note  I connected with Allison Spencer on 06/13/23 at 11:40 AM EDT by a video enabled telemedicine application and verified that I am speaking with the correct person using two identifiers.  Location: Patient: [name] Provider: In office with door shut and headphones on   I discussed the limitations of evaluation and management by telemedicine and the availability of in person appointments. The patient expressed understanding and agreed to proceed.  History of Present Illness: Patient has a hx of OAB. She was on Myrbetriq  50mg  daily since 2023 and her insurance now does not cover this. Dr. Frutoso Jing changed the prescription to Trospium  20mg  x2 daily based on coverage.   Patient reports she is doing well on the medication. She reports she does have some mild dry mouth but she was equating this to her allergies. Overall she reports she is not having significant leakage and is going to the bathroom 5-6 times during the day and up 1-2 times at night.     Observations/Objective: Patient appears well nourished, responds appropriately and is alert and oriented.    Assessment and Plan:  Encouraged patient to watch for side effects of dry eyes, dry mouth, and constipation. We discussed since she would like to continue the Trospium  20mg  x2 daily she can also incorporate some magic mouthwash to support her dry mouth, rather it is related to the medication or allergies.    Follow Up Instructions:    I discussed the assessment and treatment plan with the patient. The patient was provided an opportunity to ask questions and all were answered. The patient agreed with the plan and demonstrated an understanding of the instructions.   The patient was advised to call back or seek an in-person evaluation if the symptoms worsen or if the condition fails to improve as anticipated.  I provided 10 minutes of non-face-to-face time during this encounter.   Allison Spencer  Allison Sage Kopera, NP

## 2023-06-24 ENCOUNTER — Telehealth: Payer: Self-pay

## 2023-06-24 NOTE — Telephone Encounter (Signed)
 Allison Spencer called to reschedule her appointment with Dr.Tucker on 6/20 d/t scheduling conflict.   Pt scheduled with Dr.Jackson-Moore on 7/2 @ 10:15. Pt agreed to date/time. Aware she will pick back up with Dr.Tucker at her next visit.

## 2023-06-27 ENCOUNTER — Ambulatory Visit: Payer: 59 | Admitting: Gynecologic Oncology

## 2023-07-09 ENCOUNTER — Inpatient Hospital Stay: Attending: Obstetrics & Gynecology | Admitting: Obstetrics & Gynecology

## 2023-07-09 VITALS — BP 130/82 | HR 74 | Temp 98.5°F | Resp 20 | Wt 219.6 lb

## 2023-07-09 DIAGNOSIS — Z9221 Personal history of antineoplastic chemotherapy: Secondary | ICD-10-CM | POA: Diagnosis not present

## 2023-07-09 DIAGNOSIS — Z923 Personal history of irradiation: Secondary | ICD-10-CM | POA: Insufficient documentation

## 2023-07-09 DIAGNOSIS — Z8542 Personal history of malignant neoplasm of other parts of uterus: Secondary | ICD-10-CM | POA: Diagnosis present

## 2023-07-09 DIAGNOSIS — C541 Malignant neoplasm of endometrium: Secondary | ICD-10-CM

## 2023-07-09 NOTE — Progress Notes (Signed)
 Follow Up Note: Gyn-Onc  Allison Spencer 63 y.o. female  CC: Surveillance visit   HPI: The oncology history was reviewed.  Interval History: Discussed the use of AI scribe software for clinical note transcription with the patient, who gave verbal consent to proceed.  History of Present Illness   She has been experiencing back pain. A recent CT scan showed no concerning findings. She denies any current abdominal pain or bleeding.  She has a history of bladder issues and is under the care of Dr. Marilynne for this condition.  Her bowel movements are generally okay, although she experiences occasional trouble due to irritable bowel syndrome.  No changes in appetite, weight, energy levels, or the presence of a cough.    Review of Systems  Review of Systems  Constitutional:  Negative for malaise/fatigue and weight loss.  Respiratory:  Negative for shortness of breath and wheezing.   Cardiovascular:  Negative for chest pain and leg swelling.  Gastrointestinal:  Negative for abdominal pain, blood in stool, constipation, nausea and vomiting.  Genitourinary:  Negative for dysuria, frequency, hematuria and urgency.  Musculoskeletal:  Negative for joint pain. See above. Neurological:  Negative for weakness.  Psychiatric/Behavioral:  Negative for depression. The patient does not have insomnia.    Current medications, allergy, social history, past surgical history, past medical history, family history were all reviewed.    Vitals:  Blood pressure 130/82, pulse 74, temperature 98.5 F (36.9 C), temperature source Oral, resp. rate 20, weight 219 lb 9.6 oz (99.6 kg), SpO2 95%.  Physical Exam:  Physical Exam Exam conducted with a chaperone present.  Constitutional:      General: She is not in acute distress. Cardiovascular:     Rate and Rhythm: Normal rate and regular rhythm.  Pulmonary:     Effort: Pulmonary effort is normal.     Breath sounds: Normal breath sounds. No wheezing or  rhonchi.  Abdominal:     Palpations: Abdomen is soft.     Tenderness: There is no abdominal tenderness. There is no right CVA tenderness or left CVA tenderness.     Hernia: No hernia is present.  Genitourinary:    General: Normal vulva.     Urethra: No urethral lesion.     Vagina: No lesions. No bleeding Musculoskeletal:     Cervical back: Neck supple.     Right lower leg: No edema.     Left lower leg: No edema.  Lymphadenopathy:     Upper Body:     Right upper body: No supraclavicular adenopathy.     Left upper body: No supraclavicular adenopathy.     Lower Body: No right inguinal adenopathy. No left inguinal adenopathy.  Skin:    Findings: No rash.  Neurological:     Mental Status: She is oriented to person, place, and time.   Assessment/Plan:  Endometrial cancer (HCC) Allison Spencer is a 63 y.o. woman with recurrent grade 1 endometrioid endometrial cancer diagnosed in January, 2018, recurrence diagnosed 10/06/18. 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  S/p salvage therapy with pembrolizumab  and radiation to para-aortic nodes.  Complete clinical and radiographic response, completed in mid 2021. Last imaging 07/2021 was negative for recurrent disease.   Negative symptom review, normal exam.  No evidence of recurrence    Per NCCN surveillance recommendations, we will continue with surveillance visits every 6 months.  We reviewed signs  and symptoms that should prompt a phone call before her next scheduled visit.      [place], MD

## 2023-07-09 NOTE — Assessment & Plan Note (Addendum)
 Allison Spencer is a 63 y.o. woman with recurrent grade 1 endometrioid endometrial cancer diagnosed in January, 2018, recurrence diagnosed 10/06/18. 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  S/p salvage therapy with pembrolizumab  and radiation to para-aortic nodes.  Complete clinical and radiographic response, completed in mid 2021. Last imaging 07/2021 was negative for recurrent disease.   Negative symptom review, normal exam.  No evidence of recurrence    Per NCCN surveillance recommendations, we will continue with surveillance visits every 6 months.  We reviewed signs and symptoms that should prompt a phone call before her next scheduled visit.

## 2023-07-09 NOTE — Patient Instructions (Signed)
 Return 6 months to see Dr.Tucker

## 2023-07-10 ENCOUNTER — Other Ambulatory Visit: Payer: Self-pay | Admitting: Family

## 2023-07-10 DIAGNOSIS — Z1231 Encounter for screening mammogram for malignant neoplasm of breast: Secondary | ICD-10-CM

## 2023-07-25 ENCOUNTER — Ambulatory Visit
Admission: RE | Admit: 2023-07-25 | Discharge: 2023-07-25 | Disposition: A | Discharge status: Home / Self Care | Source: Ambulatory Visit | Attending: Family | Admitting: Family

## 2023-07-25 DIAGNOSIS — Z1231 Encounter for screening mammogram for malignant neoplasm of breast: Secondary | ICD-10-CM

## 2023-09-02 ENCOUNTER — Other Ambulatory Visit: Payer: Self-pay | Admitting: Gastroenterology

## 2023-09-12 ENCOUNTER — Other Ambulatory Visit: Payer: Self-pay | Admitting: Gastroenterology

## 2023-10-03 ENCOUNTER — Other Ambulatory Visit (HOSPITAL_COMMUNITY): Payer: Self-pay | Admitting: Family

## 2023-10-03 ENCOUNTER — Encounter: Payer: Self-pay | Admitting: *Deleted

## 2023-10-03 DIAGNOSIS — K760 Fatty (change of) liver, not elsewhere classified: Secondary | ICD-10-CM

## 2023-10-09 ENCOUNTER — Inpatient Hospital Stay (HOSPITAL_BASED_OUTPATIENT_CLINIC_OR_DEPARTMENT_OTHER): Admission: RE | Admit: 2023-10-09 | Discharge: 2023-10-09 | Attending: Family | Admitting: Family

## 2023-10-09 DIAGNOSIS — K760 Fatty (change of) liver, not elsewhere classified: Secondary | ICD-10-CM

## 2023-10-13 ENCOUNTER — Other Ambulatory Visit: Payer: Self-pay | Admitting: Gastroenterology

## 2023-10-20 ENCOUNTER — Telehealth: Payer: Self-pay | Admitting: Gastroenterology

## 2023-10-20 NOTE — Telephone Encounter (Signed)
 Good Afternoon Dr.Cirigliano   Patient would like to know if her upcoming appointment on 11/06/23 at 1:40 pm can be virtual due to her husband having surgery that day and needing to be with him. Patient does not want to wait until next availability after to come into office due to needing medication Dexilant . Patient only has two capsules left and needs to be seen for future refills.  Please advise  Thank you

## 2023-11-04 ENCOUNTER — Telehealth: Payer: Self-pay | Admitting: Gastroenterology

## 2023-11-04 MED ORDER — DEXLANSOPRAZOLE 60 MG PO CPDR: 60.0000 mg | DELAYED_RELEASE_CAPSULE | Freq: Every day | ORAL | 0 refills | Status: DC

## 2023-11-04 NOTE — Telephone Encounter (Signed)
 Patient aware that Dexilant  was sent to [facility].

## 2023-11-04 NOTE — Telephone Encounter (Signed)
 Inbound call from patient requesting to have more refill on her medication until she is able to be seen. Patient is schedule for November the 19 th. Patient is requesting a call back. Please advise.

## 2023-11-06 ENCOUNTER — Ambulatory Visit: Admitting: Gastroenterology

## 2023-11-26 ENCOUNTER — Other Ambulatory Visit: Payer: Self-pay | Admitting: Gastroenterology

## 2023-11-26 ENCOUNTER — Ambulatory Visit (INDEPENDENT_AMBULATORY_CARE_PROVIDER_SITE_OTHER): Admitting: Gastroenterology

## 2023-11-26 ENCOUNTER — Encounter: Payer: Self-pay | Admitting: Gastroenterology

## 2023-11-26 VITALS — BP 114/74 | HR 89 | Ht 63.0 in | Wt 219.0 lb

## 2023-11-26 DIAGNOSIS — K449 Diaphragmatic hernia without obstruction or gangrene: Secondary | ICD-10-CM

## 2023-11-26 DIAGNOSIS — K219 Gastro-esophageal reflux disease without esophagitis: Secondary | ICD-10-CM | POA: Diagnosis not present

## 2023-11-26 MED ORDER — DEXLANSOPRAZOLE 60 MG PO CPDR: 60.0000 mg | DELAYED_RELEASE_CAPSULE | Freq: Every day | ORAL | 1 refills | Status: AC

## 2023-11-26 MED ORDER — DEXLANSOPRAZOLE 60 MG PO CPDR
60.0000 mg | DELAYED_RELEASE_CAPSULE | Freq: Every day | ORAL | 4 refills | Status: DC
Start: 2023-11-26 — End: 2023-11-26

## 2023-11-26 MED ORDER — FAMOTIDINE 40 MG PO TABS: 40.0000 mg | ORAL_TABLET | Freq: Every day | ORAL | 4 refills | Status: AC

## 2023-11-26 NOTE — Addendum Note (Signed)
 Addended by: BENJAMINE NAT DEL on: 11/26/2023 04:05 PM   Modules accepted: Orders

## 2023-11-26 NOTE — Progress Notes (Signed)
 Chief Complaint:follow-up GERD, med refills Primary GI Doctor: Dr. San  HPI:  Allison Spencer is a  63  year old female Allison Spencer with past medical history of GERD and hiatal hernia who presents for follow-up.   Interval History Allison Spencer last seen in GI office on 06/25/22 by Harlene, [place].  Allison Spencer presents for follow-up on GERD and medication refills. Allison Spencer has history of GERD and currently taking Dexilant  60 mg po daily and Pepcid  at bedtime. She reports this has controlled her symptoms.  Allison Spencer has scheduled laparoscopic paraesophageal hernia repair with Dr. Darrelyn in January. She has been working on losing weight and went from 240lb to 219lbs.   She has history of a partial colectomy, suspect sigmoid resection, for diverticulitis in 2017 at [facility].  Has some issues with loose stools that she was told was due to to IBS and stress. Overall doing well.   GI procedures: 07/08/22 EGD - Normal esophagus.  - Z- line regular, 35 cm from the incisors. - 5 cm hiatal hernia.  - Gastritis. Biopsied.  - Gastroesophageal flap valve classified as Hill Grade IV ( no fold, wide open lumen, hiatal hernia present) .  - Normal examined duodenum.  EGD was performed November 2021 with multiple esophageal ulcers noted in the distal 10 cm of the esophagus and a large hiatal hernia.  Esophageal biopsy showed acute nonspecific esophagitis with superficial sloughing.  Fungal organisms in viral cytopathic changes were not seen.   Colonoscopy with Dr. Towana March 2016 showed only diverticulosis of the large intestine.  Colonoscopy in 2019 with Dr. Towana was normal per Allison Spencer   Wt Readings from Last 3 Encounters:  11/26/23 219 lb (99.3 kg)  07/09/23 219 lb 9.6 oz (99.6 kg)  12/27/22 235 lb (106.6 kg)    Past Medical History:  Diagnosis Date   Acquired hypothyroidism 04/26/2019   Allergy    Anxiety    Arthritis    Asthma    Back pain 02/03/2017   Cataract    Complication of  anesthesia 01/10/2016   when [place] feels like lump in throat had endo for [place] feeling continues   Coronary artery disease involving native coronary artery of native heart without angina pectoris 09/08/2014   DDD (degenerative disc disease), cervical 03/03/2014   Depression    Diverticulitis    Elevated liver enzymes 04/05/2019   Endometrial cancer (HCC)    Essential hypertension    Exposure to TB    latent takes rifadin  for 4 months for started nov 2017   Family history of brain cancer    Family history of breast cancer    Family history of colon cancer    Genetic testing 05/01/2016   Negative genetic testing on the TumorNext Lynch with CancerNext.  This is paired germline and tumor analyses for enhanced diagnosis of lynch syndrome plus analyses of 29 additional genes associated with hereditary cancer.  The CancerNext gene panel offered by [employer] includes sequencing and rearrangement analysis for the following 34 genes:   APC, ATM, BARD1, BMPR1A, BRCA1, BRCA2, BRIP1, CD   GERD (gastroesophageal reflux disease)    Goals of care, counseling/discussion 03/02/2019   H. 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   Urinary incontinence     Past Surgical History:  Procedure Laterality Date   CHOLECYSTECTOMY  2000   COLECTOMY  2016   For diverticultis   colonscopy     ROBOTIC ASSISTED TOTAL HYSTERECTOMY WITH BILATERAL SALPINGO OOPHERECTOMY Bilateral 01/18/2016    Procedure: XI ROBOTIC ASSISTED TOTAL HYSTERECTOMY WITH BILATERAL SALPINGO OOPHORECTOMY WITH SENTINAL LYMPH NODE BIOPSY; LYSIS OF ADHESIONS;  Surgeon: Maurilio Ship, MD;  Location: [facility];  Service: Gynecology;  Laterality: Bilateral;   TUBAL LIGATION  1991   UPPER GI ENDOSCOPY  01/10/2016    Current Outpatient Medications  Medication Sig Dispense Refill   albuterol (VENTOLIN HFA) 108 (90 Base) MCG/ACT inhaler Inhale 1 puff into the lungs every 6 (six) hours as needed for wheezing or shortness of breath.     cholestyramine  (QUESTRAN ) 4 g packet TAKE 1 PACKET BY MOUTH DAILY. 90 packet 1   escitalopram  (LEXAPRO ) 20 MG tablet Take 30 mg by mouth daily.  1   levothyroxine  (SYNTHROID ) 112 MCG tablet Take 112 mcg by mouth daily.     losartan  (COZAAR ) 50 MG tablet Take 1 tablet by mouth daily.     rosuvastatin (CRESTOR) 40 MG tablet Take 40 mg by mouth daily.     trospium  (SANCTURA ) 20 MG tablet Take 1 tablet (20 mg total) by mouth 2 (two) times daily. 60 tablet 0   dexlansoprazole  (DEXILANT ) 60 MG capsule Take 1 capsule (60 mg total) by mouth daily. Please keep appointment for 11-26-23 at 320pm 90 capsule 1   famotidine  (PEPCID ) 40 MG tablet Take 1 tablet (40 mg total) by mouth daily. 90 tablet 4   predniSONE (DELTASONE) 10 MG tablet Take by mouth. (Allison Spencer not taking: Reported on 11/26/2023)     QUEtiapine (SEROQUEL) 50 MG tablet Take 50 mg by mouth daily. (Allison Spencer not taking: Reported on 11/26/2023)     XHANCE 93 MCG/ACT EXHU Place 1 spray into both nostrils 2 (two) times daily. (Allison Spencer not taking: Reported on 11/26/2023)     Current Facility-Administered Medications  Medication Dose Route Frequency Provider Last Rate Last Admin   antiseptic oral rinse (BIOTENE) solution 15 mL  15 mL Mouth Rinse PRN         Allergies as of 11/26/2023 - Review Complete 11/26/2023  Allergen Reaction Noted   Codeine Nausea And Vomiting 09/08/2014   Other  05/31/2019   Sulfa antibiotics Nausea And Vomiting  09/08/2014    Family History  Problem Relation Age of Onset   COPD Mother    CAD Father    Pulmonary embolism Sister    Heart attack Brother    COPD Brother    Cancer Maternal Aunt        lung ca   Cancer Maternal Uncle        brain ca   Colon cancer Maternal Uncle    Breast cancer Maternal Grandmother    Cancer Maternal Grandmother 37       breast ca   Brain cancer Cousin        dx under 50   Ovarian cancer Neg Hx    Endometrial cancer Neg Hx    Pancreatic cancer Neg Hx    Prostate cancer Neg Hx    Esophageal cancer Neg  Hx    Stomach cancer Neg Hx    Rectal cancer Neg Hx     Review of Systems:    Constitutional: No weight loss, fever, chills, weakness or fatigue HEENT: Eyes: No change in vision               Ears, Nose, Throat:  No change in hearing or congestion Skin: No rash or itching Cardiovascular: No chest pain, chest pressure or palpitations   Respiratory: No SOB or cough Gastrointestinal: See HPI and otherwise negative Genitourinary: No dysuria or change in urinary frequency Neurological: No headache, dizziness or syncope Musculoskeletal: No new muscle or joint pain Hematologic: No bleeding or bruising Psychiatric: No history of depression or anxiety    Physical Exam:  Vital signs: BP 114/74   Pulse 89   Ht 5' 3 (1.6 m)   Wt 219 lb (99.3 kg)   SpO2 94%   BMI 38.79 kg/m   Constitutional:   Pleasant female appears to be in NAD, Well developed, Well nourished, alert and cooperative Throat: Oral cavity and pharynx without inflammation, swelling or lesion.  Respiratory: Respirations even and unlabored. Lungs clear to auscultation bilaterally.   No wheezes, crackles, or rhonchi.  Cardiovascular: Normal S1, S2. Regular rate and rhythm. No peripheral edema, cyanosis or pallor.  Gastrointestinal:  Soft, nondistended, nontender. No rebound or guarding. Normal bowel sounds. No appreciable masses or hepatomegaly. Rectal:  Not performed.  Msk:  Symmetrical  without gross deformities. Without edema, no deformity or joint abnormality.  Neurologic:  Alert and  oriented x4;  grossly normal neurologically.  Skin:   Dry and intact without significant lesions or rashes.  RELEVANT LABS AND IMAGING: CBC    Latest Ref Rng & Units 07/20/2019    9:48 AM 06/14/2019    8:24 AM 05/25/2019    8:05 AM  CBC  WBC 4.0 - 10.5 K/uL 9.8  8.8  9.1   Hemoglobin 12.0 - 15.0 g/dL 87.1  86.5  86.4   Hematocrit 36.0 - 46.0 % 38.9  42.4  40.3   Platelets 150 - 400 K/uL 226  217  250      CMP     Latest Ref Rng & Units 07/16/2021    1:40 PM 01/03/2021    2:44 PM 07/20/2019    9:48 AM  CMP  Glucose 70 - 99 mg/dL 891  899  99   BUN 8 - 23 mg/dL 11  15  16    Creatinine 0.44 - 1.00 mg/dL 9.15  9.17  9.04   Sodium 135 - 145 mmol/L 140  139  140   Potassium 3.5 - 5.1 mmol/L 4.6  4.0  4.3   Chloride 98 - 111 mmol/L 106  108  109   CO2 22 - 32 mmol/L 29  25  22    Calcium 8.9 - 10.3 mg/dL 9.3  8.8  9.1   Total Protein 6.5 - 8.1 g/dL   7.0   Total Bilirubin 0.3 - 1.2 mg/dL   0.5   Alkaline Phos 38 - 126 U/L   115   AST 15 - 41 U/L   15   ALT 0 - 44 U/L   15      Lab Results  Component Value Date   TSH 37.058 (H) 07/20/2019     Assessment: Encounter Diagnoses  Name Primary?   Gastroesophageal reflux disease without esophagitis Yes   Hiatal hernia      Allison Spencer has history of GERD with HH doing well  with Dexilant  and Pepcid  along with strict GERD diet.  No changes needed.  Refill prescription. Hernia repair surgery scheduled for 1/26.    Records show colonoscopy with Dr. Towana in 2016 showed only diverticulosis of the large intestine. Recall 03/2024  Plan: - continue Dexilant  60 mg po daily, refilled -continue Pepcid  40 mg po daily, refilled Recall colonoscopy 03/2024   Thank you for the courtesy of this consult. Please call me with any questions or concerns.   Annabel Gibeau, FNP-C [facility] 11/26/2023, 4:27 PM  Cc: Silvano Angeline FALCON, NP

## 2024-01-06 NOTE — Addendum Note (Signed)
 Encounter addended by: Charlene Detter L on: 01/06/2024 10:24 AM  Actions taken: Imaging Exam ended

## 2024-01-15 ENCOUNTER — Other Ambulatory Visit: Payer: Self-pay

## 2024-01-15 ENCOUNTER — Inpatient Hospital Stay: Attending: Gynecologic Oncology | Admitting: Gynecologic Oncology

## 2024-01-15 ENCOUNTER — Ambulatory Visit
Admission: RE | Admit: 2024-01-15 | Discharge: 2024-01-15 | Disposition: A | Discharge status: Home / Self Care | Payer: Self-pay | Source: Ambulatory Visit | Attending: Gynecologic Oncology | Admitting: Gynecologic Oncology

## 2024-01-15 ENCOUNTER — Encounter: Payer: Self-pay | Admitting: Gynecologic Oncology

## 2024-01-15 VITALS — BP 123/84 | HR 70 | Temp 97.9°F | Resp 20 | Wt 218.6 lb

## 2024-01-15 DIAGNOSIS — Z8542 Personal history of malignant neoplasm of other parts of uterus: Secondary | ICD-10-CM | POA: Insufficient documentation

## 2024-01-15 DIAGNOSIS — Z90710 Acquired absence of both cervix and uterus: Secondary | ICD-10-CM | POA: Diagnosis not present

## 2024-01-15 DIAGNOSIS — Z90722 Acquired absence of ovaries, bilateral: Secondary | ICD-10-CM | POA: Insufficient documentation

## 2024-01-15 DIAGNOSIS — Z9079 Acquired absence of other genital organ(s): Secondary | ICD-10-CM | POA: Diagnosis not present

## 2024-01-15 DIAGNOSIS — Z9221 Personal history of antineoplastic chemotherapy: Secondary | ICD-10-CM | POA: Insufficient documentation

## 2024-01-15 DIAGNOSIS — C541 Malignant neoplasm of endometrium: Secondary | ICD-10-CM

## 2024-01-15 DIAGNOSIS — Z923 Personal history of irradiation: Secondary | ICD-10-CM | POA: Insufficient documentation

## 2024-01-15 NOTE — Progress Notes (Signed)
 Gynecologic Oncology Return Clinic Visit  01/15/2024  Reason for Visit: follow-up  Treatment History: Oncology History Overview Note  MSI high disease detected   Endometrial cancer (HCC)  07/05/2015 Pathology Results   She had abnormal PAP   11/24/2015 Procedure   She underwent endometrial sampling that came back abnormal   01/10/2016 Procedure   She had EGD which showed esophagitis   01/18/2016 Pathology Results   Diagnosis 1. Lymph node, sentinel, biopsy, right obturator - MICROMETASTASIS IN ONE OF ONE LYMPH NODES (1/1). 2. Lymph node, sentinel, biopsy, left external - ONE OF ONE LYMPH NODES NEGATIVE FOR CARCINOMA (0/1). 3. Lymph node, sentinel, biopsy, left obturator - MICROMETASTASIS IN ONE OF ONE LYMPH NODES (1/1). 4. Uterus +/- tubes/ovaries, neoplastic - UTERUS: -ENDOMYOMETRIUM: ENDOMETRIOID ADENOCARCINOMA, FIGO GRADE 1, SPANNING 3.3 CM. TUMOR INVADES LESS THAN 1/2 OF THE MYOMETRIUM. SEE ONCOLOGY TABLE. -SEROSA: UNREMARKABLE. NO MALIGNANCY IDENTIFIED. - CERVIX: BENIGN SQUAMOUS AND ENDOCERVICAL MUCOSA. NO DYSPLASIA OR MALIGNANCY. - BILATERAL OVARIES: INCLUSION CYSTS. NO MALIGNANCY. - BILATERAL FALLOPIAN TUBES: UNREMARKABLE RIGHT TUBE. LEFT TUBE IS NOT IDENTIFIED GROSSLY OR MICROSCOPICALLY.   01/18/2016 Surgery   She underwent robotic-assisted laparoscopic total hysterectomy with bilateral salpingoophorectomy, sentinel lymph node biopsy, lysis of adhesions   04/24/2016 Genetic Testing   Negative genetic testing on the TumorNext Lynch with CancerNext.  This is paired germline and tumor analyses for enhanced diagnosis of lynch syndrome plus analyses of 29 additional genes associated with hereditary cancer.  The CancerNext gene panel offered by [employer] includes sequencing and rearrangement analysis for the following 34 genes:   APC, ATM, BARD1, BMPR1A, BRCA1, BRCA2, BRIP1, CDH1, CDK4, CDKN2A, CHEK2, DICER, HOXB13, EPCAM, GREM1, MLH1, MRE11A, MSH2, MSH6, MUTYH, NBN, NF1,  PALB2, PMS2, POLD1, POLE, PTEN, RAD50, RAD51C, RAD51D, SMAD4, SMARCA4, STK11, and TP53.   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  Taken with the germline results demonstrating absence of pathogenic mutations or likely pathogenic variants (VLPs) in the mismatch repair (MMR) genes, the likelihood that this individual has Lynch syndrome/HNPCC is greatly decreased; however, the possibility of an undetected variant of either germline or somatic origin due to mosaicism and other rare etiologies cannot be ruled out by the current methodology.  Correlation with clinical history and external tumor testing results is advised.    10/29/2018 PET scan   Outside PET scan showed enlarged aorta-caval LN   12/17/2018 - 01/15/2019 Radiation Therapy   Radiation Treatment Dates: 12/17/2018 through 01/15/2019 Site Technique Total Dose (Gy) Dose per Fx (Gy) Completed Fx Beam Energies  Abdomen: Abd_PA node 3D 50/50 2.5 20/20 6X, 15X        03/01/2019 Imaging   1. No CT findings for metastatic disease involving the chest, abdomen or pelvis. 2. Stable moderate-sized hiatal hernia. 3. Status post cholecystectomy. No biliary dilatation. 4. Stable hepatic cysts.   03/15/2019 - 07/20/2019 Chemotherapy   Patient is on Treatment Plan : UTERINE Pembrolizumab  q21d     06/14/2019 Imaging   1. No acute findings within the abdomen or pelvis. No findings of recurrent tumor or metastatic disease. 2. Hiatal hernia    She was for a routine pap smear by her PCP, [name], FNP on 07/05/15. This revealed atypical glandular cells - endometrial. She was then seen by [name] on 11/24/15 and reported a single episode of light pink spotting but otherwise no bleeding. On 11/24/15 [name] performed an endometrial biopsy which revealed well differentiated (FIGO grade 1) endometrioid endometrial cancer. On 01/18/16 she  underwent robotic assisted total hysterectomy, BSO, and SLN biopsy.  Surgery was uncomplicated and there were no suspicious findings intraoperatively.   Final pathology revealed a stage IIIC1 endometrioid endometrial adenocarcinoma with a 3.3cm tumor invading <50% (1cm of 2.4cm) with no LVSI. However, bilateral obturator SLN's revealed micrometastases on both H&E and IHC.   Postoperative imaging (including CT abdo/pelvis and c  hest x ray) revealed no enlarged nodes, residual disease or chest disease.   The patient was recommended to received 6 cycles of adjuvant carboplatin and paclitaxel in accordance with NCCN guidelines. She has met with medical oncology, [name], but declined adjuvant chemotherapy as she was concerned about immunity. She elected for an anti-cancer diet.   She was tested for Lynch syndrome (her tumor is MSI positive with loss of MLH1 expression), however, Lynch syndrome testing was negative.   In 2019 she reported + radicular pain down left leg. She has a known disc prolapse and is seeing an orthopedic surgeon. Repeat CT scan on 05/26/17 showed no evidence of recurrence including no adenopathy in retroperitoneal region, and no explanation for her pain.    She had had a persistent cough since October, 2019. CT chest in October, 2019 showed a tiny subpleural nodule in the anterior aspect of the left lower lobe abutting the major fissure. Annual follow-up was recommended, though it was suspected to be benign. CXR on 02/03/17 showed normal heart and lungs.    In 2020 she reported progressive low back pain, sciatic pain and neck pains. Due to her back pain a CT chest abdomen pelvis was performed at [facility] on October 06, 2018. CT of the abdomen and pelvis revealed a 9 mm short axis right periaortic node that was new and suspicious for recurrent endometrial cancer.  There was no other gross abdominal or retroperitoneal recurrence.   She received pembrolizumab  for salvage therapy between 03/15/19 and 07/20/19. She also received salvage  radiation (50 Gy in 20 fractions to the PA nodal region) with [name] between 12/17/18 through 01/15/19.   Post-treatment imaging on 06/14/19 showed no evidence of recurrence/progression.   She developed cough/respiratory symptoms and was seen by pulmonology. It was not felt that her symptoms were secondary to Keytruda  (eg pneumonitis). The patient self-discontinued therapy in July, 2021 (plan had been for ongoing Keytruda ) and did not return for follow-up for approximately 1 year.   CT abd/pelvis (surveillance) in June, 2022 was performed at [facility] and showed no evidence of persistent/recurrent disease.    Interval History: Doing well.  Having hiatal hernia surgery next week at [facility].  Denies any abdominal or pelvic pain.  Reports normal bowel function.  Continues to struggle with baseline bladder issues, transition from Myrbetriq  to trospium  because of insurance.  Still feels that this is helping.  Past Medical/Surgical History: Past Medical History:  Diagnosis Date   Acquired hypothyroidism 04/26/2019   Allergy    Anxiety    Arthritis    Asthma    Back pain 02/03/2017   Cataract    Complication of anesthesia 01/10/2016   when [place] feels like lump in throat had endo for [place] feeling continues   Coronary artery disease involving native coronary artery of native heart without angina pectoris 09/08/2014   DDD (degenerative disc disease), cervical 03/03/2014   Depression    Diverticulitis    Elevated liver enzymes 04/05/2019   Endometrial cancer (HCC)    Essential hypertension    Exposure to TB    latent takes rifadin  for 4  months for started nov 2017   Family history of brain cancer    Family history of breast cancer    Family history of colon cancer    Genetic testing 05/01/2016   Negative genetic testing on the TumorNext Lynch with CancerNext.  This is paired germline and tumor analyses for enhanced diagnosis of lynch syndrome plus analyses of 29 additional genes associated  with hereditary cancer.  The CancerNext gene panel offered by [employer] includes sequencing and rearrangement analysis for the following 34 genes:   APC, ATM, BARD1, BMPR1A, BRCA1, BRCA2, BRIP1, CD   GERD (gastroesophageal reflux disease)    Goals of care, counseling/discussion 03/02/2019   H. 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   Urinary incontinence     Past Surgical History:  Procedure Laterality Date   CHOLECYSTECTOMY  2000   COLECTOMY  2016   For diverticultis   colonscopy     ROBOTIC ASSISTED TOTAL HYSTERECTOMY WITH BILATERAL SALPINGO OOPHERECTOMY Bilateral 01/18/2016   Procedure: XI ROBOTIC ASSISTED TOTAL HYSTERECTOMY WITH BILATERAL SALPINGO OOPHORECTOMY WITH SENTINAL LYMPH NODE BIOPSY; LYSIS OF ADHESIONS;  Surgeon: Maurilio Ship, MD;  Location: [facility];  Service: Gynecology;  Laterality: Bilateral;   TUBAL LIGATION  1991   UPPER GI ENDOSCOPY  01/10/2016    Family History  Problem Relation Age of Onset   COPD Mother    CAD Father    Pulmonary embolism Sister    Heart attack Brother    COPD Brother    Cancer Maternal Aunt        lung ca   Cancer Maternal Uncle        brain ca   Colon cancer Maternal Uncle    Breast cancer Maternal Grandmother    Cancer Maternal Grandmother 72       breast ca   Brain cancer Cousin        dx under 50   Ovarian cancer Neg Hx    Endometrial cancer Neg Hx    Pancreatic cancer Neg Hx    Prostate cancer Neg Hx     Esophageal cancer Neg Hx    Stomach cancer Neg Hx    Rectal cancer Neg Hx     Social History   Socioeconomic History   Marital status: Married    Spouse name: Richard   Number of children: 3   Years of education: Not on file   Highest education level: Not on file  Occupational History   Occupation: Non-working, applying disability  Tobacco Use   Smoking status: Former    Current packs/day: 0.00    Average packs/day: 1 pack/day for 10.0 years (10.0 ttl pk-yrs)    Types: Cigarettes    Start date: 12/06/1978    Quit date: 12/05/1988    Years since quitting: 35.1   Smokeless tobacco: Never  Vaping Use   Vaping status: Never Used  Substance and Sexual Activity   Alcohol use: No  Drug use: No   Sexual activity: Yes    Partners: Male  Other Topics Concern   Not on file  Social History Narrative   Taking care of her grandson who has autism, 23 years old   Social Drivers of Health   Tobacco Use: Medium Risk (01/15/2024)   Patient History    Smoking Tobacco Use: Former    Smokeless Tobacco Use: Never    Passive Exposure: Not on Actuary Strain: Not on file  Food Insecurity: Not on file  Transportation Needs: Not on file  Physical Activity: Not on file  Stress: Not on file  Social Connections: Not on file  Depression (EYV7-0): Not on file  Alcohol Screen: Not on file  Housing: Not on file  Utilities: Not on file  Health Literacy: Not on file    Current Medications: Current Medications[1]  Review of Systems: + depression Denies appetite changes, fevers, chills, fatigue, unexplained weight changes. Denies hearing loss, neck lumps or masses, mouth sores, ringing in ears or voice changes. Denies cough or wheezing.  Denies shortness of breath. Denies chest pain or palpitations. Denies leg swelling. Denies abdominal distention, pain, blood in stools, constipation, diarrhea, nausea, vomiting, or early satiety. Denies pain with intercourse, dysuria,  frequency, hematuria or incontinence. Denies hot flashes, pelvic pain, vaginal bleeding or vaginal discharge.   Denies joint pain, back pain or muscle pain/cramps. Denies itching, rash, or wounds. Denies dizziness, headaches, numbness or seizures. Denies swollen lymph nodes or glands, denies easy bruising or bleeding. Denies anxiety, confusion, or decreased concentration.  Physical Exam: BP 123/84 (BP Location: Left Arm, Patient Position: Sitting)   Pulse 70   Temp 97.9 F (36.6 C) (Oral)   Resp 20   Wt 218 lb 9.6 oz (99.2 kg)   SpO2 94%   BMI 38.72 kg/m  General: Alert, oriented, no acute distress. HEENT: Normocephalic, atraumatic, sclera anicteric. Chest: Clear to auscultation bilaterally.  Minimal expiratory wheezing bilaterally with some breaths. Cardiovascular: Regular rate and rhythm, no murmurs. Abdomen: Obese, soft, nontender.  Normoactive bowel sounds.  No masses or hepatosplenomegaly appreciated.  Well-healed incisions. Back: Tenderness with palpation along the left low back. Extremities: Grossly normal range of motion.  Warm, well perfused.  No edema bilaterally. Skin: No rashes or lesions noted. Lymphatics: No cervical, supraclavicular, or inguinal adenopathy. GU: Normal appearing external genitalia without erythema, excoriation, or lesions.  Speculum exam reveals moderately atrophic vaginal mucosa, no lesions noted, no bleeding or discharge.  Bimanual exam reveals cuff intact, smooth, no nodularity.  Rectovaginal exam confirms these findings, no masses.  Laboratory & Radiologic Studies: Last imaging in 10/2023: showed few scattered simple hepatic cysts, measuring up to 1.5 cm.  Diffuse thickening of the stomach suggestive of gastritis.  Moderate amount of fecal residual in the large intestine.  Minimal PERI pancreatic fat haziness.  Scattered fluid-filled small bowel's, probably enteritis.  Fluid-filled proximal colon, probably secondary to diarrhea/enteritis.  Bilateral  fat-containing inguinal hernias without incarceration.  Assessment & Plan: Allison Spencer is a 64 y.o. woman with history of recurrent grade 1 endometrioid endometrial cancer (initially diagnosed in January, 2018), recurrence diagnosed 10/06/18. 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  S/p salvage therapy with pembrolizumab  and radiation to para-aortic nodes.  Complete clinical and radiographic response, completed in mid 2021.  Recent imaging negative for current/metastatic disease.   Patient is overall doing well and is  NED on exam today.     Patient has a history of overactive bladder and is on Trospium . Follows with Dr. Marilynne.     Per NCCN surveillance recommendations, we will continue with surveillance visits every 6 months.  At the time of her next visit, she will be 5 years out from completion of treatment for her recurrent disease.  We discussed transition to yearly visits in our office for a couple of years and then transitioning to yearly visit with her OB/GYN.  We reviewed signs and symptoms that should prompt a phone call before her next scheduled visit.    20 minutes of total time was spent for this patient encounter, including preparation, face-to-face counseling with the patient and coordination of care, and documentation of the encounter.  Comer Dollar, MD  Division of Gynecologic Oncology  Department of Obstetrics and Gynecology  [facility]      [1]  Current Outpatient Medications:    albuterol (VENTOLIN HFA) 108 (90 Base) MCG/ACT inhaler, Inhale 1 puff into the lungs every 6 (six) hours as needed for wheezing or shortness of breath., Disp: , Rfl:    dexlansoprazole  (DEXILANT ) 60 MG capsule, Take 1 capsule (60 mg total) by mouth daily. Please keep appointment for 11-26-23 at 320pm, Disp: 90 capsule, Rfl:  1   escitalopram  (LEXAPRO ) 20 MG tablet, Take 30 mg by mouth daily., Disp: , Rfl: 1   famotidine  (PEPCID ) 40 MG tablet, Take 1 tablet (40 mg total) by mouth daily., Disp: 90 tablet, Rfl: 4   levothyroxine  (SYNTHROID ) 112 MCG tablet, Take 112 mcg by mouth daily. (Patient taking differently: Take 112 mcg by mouth daily. Pt states her dose has decreased to 88 mcg), Disp: , Rfl:    losartan  (COZAAR ) 50 MG tablet, Take 1 tablet by mouth daily., Disp: , Rfl:    rosuvastatin (CRESTOR) 40 MG tablet, Take 40 mg by mouth daily., Disp: , Rfl:    trospium  (SANCTURA ) 20 MG tablet, Take 1 tablet (20 mg total) by mouth 2 (two) times daily., Disp: 60 tablet, Rfl: 0   cholestyramine  (QUESTRAN ) 4 g packet, TAKE 1 PACKET BY MOUTH DAILY. (Patient not taking: Reported on 01/14/2024), Disp: 90 packet, Rfl: 1   predniSONE (DELTASONE) 10 MG tablet, Take by mouth. (Patient not taking: Reported on 01/14/2024), Disp: , Rfl:    QUEtiapine (SEROQUEL) 50 MG tablet, Take 50 mg by mouth daily. (Patient not taking: Reported on 01/14/2024), Disp: , Rfl:    XHANCE 93 MCG/ACT EXHU, Place 1 spray into both nostrils 2 (two) times daily. (Patient not taking: Reported on 11/26/2023), Disp: , Rfl:   Current Facility-Administered Medications:    antiseptic oral rinse (BIOTENE) solution 15 mL, 15 mL, Mouth Rinse, PRN,

## 2024-01-15 NOTE — Patient Instructions (Signed)
 It was good to see you today.  I do not see or feel any evidence of cancer recurrence on your exam.  I will see you for follow-up in 6 months.  As always, if you develop any new and concerning symptoms before your next visit, please call to see me sooner.

## 2024-01-19 ENCOUNTER — Encounter: Payer: Self-pay | Admitting: Family Medicine

## 2024-08-10 ENCOUNTER — Inpatient Hospital Stay: Admitting: Gynecologic Oncology
